# Patient Record
Sex: Female | Born: 1990 | Race: White | Hispanic: No | Marital: Married | Smoking: Former smoker
Health system: Southern US, Community
[De-identification: ages and names within clinical notes are randomized; demographics above are authoritative.]

## PROBLEM LIST (undated history)

## (undated) ENCOUNTER — Inpatient Hospital Stay (HOSPITAL_COMMUNITY): Payer: Self-pay

## (undated) DIAGNOSIS — I82402 Acute embolism and thrombosis of unspecified deep veins of left lower extremity: Secondary | ICD-10-CM

## (undated) DIAGNOSIS — D6862 Lupus anticoagulant syndrome: Secondary | ICD-10-CM

## (undated) DIAGNOSIS — F1910 Other psychoactive substance abuse, uncomplicated: Secondary | ICD-10-CM

## (undated) DIAGNOSIS — F319 Bipolar disorder, unspecified: Secondary | ICD-10-CM

## (undated) DIAGNOSIS — F4310 Post-traumatic stress disorder, unspecified: Secondary | ICD-10-CM

## (undated) DIAGNOSIS — G8220 Paraplegia, unspecified: Secondary | ICD-10-CM

## (undated) DIAGNOSIS — F329 Major depressive disorder, single episode, unspecified: Secondary | ICD-10-CM

## (undated) DIAGNOSIS — M549 Dorsalgia, unspecified: Secondary | ICD-10-CM

## (undated) DIAGNOSIS — F32A Depression, unspecified: Secondary | ICD-10-CM

## (undated) DIAGNOSIS — G8929 Other chronic pain: Secondary | ICD-10-CM

## (undated) DIAGNOSIS — O24419 Gestational diabetes mellitus in pregnancy, unspecified control: Secondary | ICD-10-CM

## (undated) DIAGNOSIS — F419 Anxiety disorder, unspecified: Secondary | ICD-10-CM

## (undated) DIAGNOSIS — M419 Scoliosis, unspecified: Secondary | ICD-10-CM

## (undated) HISTORY — DX: Post-traumatic stress disorder, unspecified: F43.10

## (undated) HISTORY — DX: Dorsalgia, unspecified: M54.9

## (undated) HISTORY — DX: Scoliosis, unspecified: M41.9

## (undated) HISTORY — DX: Anxiety disorder, unspecified: F41.9

## (undated) HISTORY — DX: Paraplegia, unspecified: G82.20

## (undated) HISTORY — DX: Lupus anticoagulant syndrome: D68.62

## (undated) HISTORY — DX: Acute embolism and thrombosis of unspecified deep veins of left lower extremity: I82.402

## (undated) HISTORY — DX: Depression, unspecified: F32.A

## (undated) HISTORY — DX: Major depressive disorder, single episode, unspecified: F32.9

---
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
## 2008-01-01 ENCOUNTER — Emergency Department (HOSPITAL_COMMUNITY): Admission: EM | Admit: 2008-01-01 | Discharge: 2008-01-01 | Payer: Self-pay | Admitting: Emergency Medicine

## 2010-01-26 ENCOUNTER — Encounter: Payer: Self-pay | Admitting: Internal Medicine

## 2010-06-19 ENCOUNTER — Encounter: Payer: Self-pay | Admitting: Family Medicine

## 2010-06-30 NOTE — Letter (Signed)
[facility]   Imported By: Lanelle Bal 02/08/2010 09:19:48  _____________________________________________________________________  External Attachment:    Type:   Image     Comment:   External Document
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
## 2010-10-14 NOTE — Group Therapy Note (Signed)
NAMEKASSIDI, Theresa Shaw NO.:  [id number]   MEDICAL RECORD NO.:  [id number]          PATIENT TYPE:  INP   LOCATION:  A428                          FACILITY:  [facility]   PHYSICIAN:  Scott A. Gerda Diss, MD    DATE OF BIRTH:  1991/01/24   DATE OF PROCEDURE:  DATE OF DISCHARGE:                                   PROGRESS NOTE   Patient overall doing better.  INR is up slightly compared to where it was  but still not where it needs to be.  Our goal is to get the INR up in the  2.5-3 range but at least in the 2 range before patient can go home.  The INR  is currently 1.1.  She is breathing well, no chest pains or discomfort.  Her  scan yesterday did not show a PE.   I did speak with Dr. Mariel Sleet yesterday, and he will be coming by for  initial visit, and overall I think the patient still needs to stay here  because I really do not feel that there is adequate social support at home  and in regards to doing these shots.  Keeping her at bed rest for right now.      Scott A. Gerda Diss, MD  Electronically Signed     SAL/MEDQ  D:  03/22/2005  T:  03/22/2005  Job:  [id number]
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
## 2010-10-14 NOTE — Discharge Summary (Signed)
[name], Theresa Shaw NO.:  [id number]   MEDICAL RECORD NO.:  [id number]          PATIENT TYPE:  INP   LOCATION:  A428                          FACILITY:  [facility]   PHYSICIAN:  Scott A. Gerda Diss, MD    DATE OF BIRTH:  May 30, 1990   DATE OF ADMISSION:  03/20/2005  DATE OF DISCHARGE:  10/29/2006LH                                 DISCHARGE SUMMARY   DISCHARGE DIAGNOSIS:  Deep vein thrombus left leg, also urinary tract  infection, also paraplegia due to a spinal cord injury.   HOSPITAL COURSE:  The patient was admitted in to the hospital and treated  with Lovenox initially and monitored closely and gradually improved and on  10/29 INR was to a point where she could be discharged to home.  INR was  2.7.  At that point, she was sent home on Coumadin and Reglan before meals  and at bedtime, Pepcid b.i.d., Zanaflex p.r.n., methadone once daily,  Celebrex daily, stresstabs every 6-8 hours as needed.  It should be noted  that the patient was often in the room by herself when rounds were done,  making it difficulty to communicate with parents the findings.  Good care  was taken care of the patient.  She did have some physical therapy while she  was in the hospital.  It is our opinion that the parents do a good job  taking care of the young lady, but it is best at her given age that a family  member be with her when we do rounds to adequately discuss the medical  progress.  To get PT/INR drawn as an outpatient this coming week and to  follow up within two weeks.      Scott A. Gerda Diss, MD  Electronically Signed     SAL/MEDQ  D:  04/03/2005  T:  04/03/2005  Job:  [id number]

## 2011-02-24 LAB — CBC
HCT: 37
Hemoglobin: 12.8
MCHC: 34.7
MCV: 88.7
Platelets: 212
RBC: 4.18
RDW: 11.9
WBC: 10.8 — ABNORMAL HIGH

## 2011-02-24 LAB — DIFFERENTIAL
Basophils Absolute: 0.1
Basophils Relative: 1
Eosinophils Absolute: 0.2
Eosinophils Relative: 2
Lymphocytes Relative: 20
Lymphs Abs: 2.1
Monocytes Absolute: 0.7
Monocytes Relative: 7
Neutro Abs: 7.7
Neutrophils Relative %: 71

## 2011-02-24 LAB — HCG, QUANTITATIVE, PREGNANCY: hCG, Beta Chain, Quant, S: 83248 — ABNORMAL HIGH

## 2011-02-24 LAB — ABO/RH: ABO/RH(D): A NEG

## 2011-03-16 LAB — URINALYSIS, ROUTINE W REFLEX MICROSCOPIC
Bilirubin Urine: NEGATIVE
Glucose, UA: NEGATIVE
Ketones, ur: NEGATIVE
Nitrite: POSITIVE — AB
Specific Gravity, Urine: 1.02
Urobilinogen, UA: 0.2
pH: 8

## 2011-03-16 LAB — URINE CULTURE: Colony Count: >=100000

## 2011-03-16 LAB — URINE MICROSCOPIC-ADD ON

## 2011-03-16 LAB — RAPID STREP SCREEN (MED CTR MEBANE ONLY): Streptococcus, Group A Screen (Direct): POSITIVE — AB

## 2011-10-31 ENCOUNTER — Ambulatory Visit (HOSPITAL_COMMUNITY): Payer: Self-pay

## 2011-11-27 ENCOUNTER — Encounter (HOSPITAL_COMMUNITY): Payer: Self-pay | Admitting: Oncology

## 2011-11-27 ENCOUNTER — Encounter (HOSPITAL_COMMUNITY): Payer: Medicaid Other | Attending: Oncology | Admitting: Oncology

## 2011-11-27 VITALS — BP 103/63 | HR 90 | Temp 98.0°F

## 2011-11-27 DIAGNOSIS — M4120 Other idiopathic scoliosis, site unspecified: Secondary | ICD-10-CM

## 2011-11-27 DIAGNOSIS — M419 Scoliosis, unspecified: Secondary | ICD-10-CM

## 2011-11-27 DIAGNOSIS — Z86718 Personal history of other venous thrombosis and embolism: Secondary | ICD-10-CM

## 2011-11-27 DIAGNOSIS — G8220 Paraplegia, unspecified: Secondary | ICD-10-CM

## 2011-11-27 HISTORY — DX: Scoliosis, unspecified: M41.9

## 2011-11-27 NOTE — Patient Instructions (Signed)
[facility]  Discharge Instructions  RECOMMENDATIONS MADE BY THE CONSULTANT AND ANY TEST RESULTS WILL BE SENT TO YOUR REFERRING DOCTOR.    We will do a hypercoagulable panel today. We will have you come back in 7-10 days to review test results.    I acknowledge that I have been informed and understand all the instructions given to me and received a copy. I do not have any more questions at this time, but understand that I may call the Specialty Clinic at [facility] at [phone] during business hours should I have any further questions or need assistance in obtaining follow-up care.    __________________________________________  _____________  __________ Signature of Patient or Authorized Representative            Date                   Time    __________________________________________ Nurse's Signature
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
## 2011-11-28 ENCOUNTER — Other Ambulatory Visit (HOSPITAL_COMMUNITY): Payer: Self-pay

## 2011-11-29 ENCOUNTER — Encounter (HOSPITAL_COMMUNITY): Payer: Self-pay | Admitting: Oncology

## 2011-12-06 ENCOUNTER — Telehealth (HOSPITAL_COMMUNITY): Payer: Self-pay | Admitting: *Deleted

## 2011-12-07 ENCOUNTER — Ambulatory Visit (HOSPITAL_COMMUNITY): Payer: Self-pay | Admitting: Oncology

## 2011-12-08 ENCOUNTER — Encounter (HOSPITAL_COMMUNITY): Payer: Self-pay | Admitting: Oncology

## 2011-12-08 ENCOUNTER — Ambulatory Visit (HOSPITAL_COMMUNITY): Payer: Self-pay | Admitting: Oncology

## 2011-12-22 ENCOUNTER — Emergency Department (HOSPITAL_COMMUNITY)
Admission: EM | Admit: 2011-12-22 | Discharge: 2011-12-22 | Discharge status: Against Medical Advice | Payer: Medicaid Other | Attending: Emergency Medicine | Admitting: Emergency Medicine

## 2011-12-22 ENCOUNTER — Encounter (HOSPITAL_COMMUNITY): Payer: Self-pay

## 2011-12-22 DIAGNOSIS — M4120 Other idiopathic scoliosis, site unspecified: Secondary | ICD-10-CM | POA: Insufficient documentation

## 2011-12-22 DIAGNOSIS — F4310 Post-traumatic stress disorder, unspecified: Secondary | ICD-10-CM | POA: Insufficient documentation

## 2011-12-22 DIAGNOSIS — M545 Low back pain, unspecified: Secondary | ICD-10-CM | POA: Insufficient documentation

## 2011-12-22 DIAGNOSIS — M549 Dorsalgia, unspecified: Secondary | ICD-10-CM

## 2011-12-22 DIAGNOSIS — Z86718 Personal history of other venous thrombosis and embolism: Secondary | ICD-10-CM | POA: Insufficient documentation

## 2011-12-22 DIAGNOSIS — G8220 Paraplegia, unspecified: Secondary | ICD-10-CM | POA: Insufficient documentation

## 2011-12-22 DIAGNOSIS — Z8669 Personal history of other diseases of the nervous system and sense organs: Secondary | ICD-10-CM

## 2011-12-22 DIAGNOSIS — F17200 Nicotine dependence, unspecified, uncomplicated: Secondary | ICD-10-CM | POA: Insufficient documentation

## 2011-12-22 NOTE — ED Notes (Signed)
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  Pt has take opiates for the past 6 yrs for back pain.  Pt reports that she is unable to stop on her own.  Pt denies any HI/SI.  Pt requesting outpatient care.
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
## 2012-02-13 ENCOUNTER — Emergency Department (HOSPITAL_COMMUNITY)
Admission: EM | Admit: 2012-02-13 | Discharge: 2012-02-14 | Disposition: A | Discharge status: Other Acute Inpatient Hospital | Payer: 59 | Source: Home / Self Care | Attending: Emergency Medicine | Admitting: Emergency Medicine

## 2012-02-13 ENCOUNTER — Encounter (HOSPITAL_COMMUNITY): Payer: Self-pay

## 2012-02-13 DIAGNOSIS — R45851 Suicidal ideations: Secondary | ICD-10-CM | POA: Insufficient documentation

## 2012-02-13 DIAGNOSIS — N390 Urinary tract infection, site not specified: Secondary | ICD-10-CM | POA: Insufficient documentation

## 2012-02-13 DIAGNOSIS — F1910 Other psychoactive substance abuse, uncomplicated: Secondary | ICD-10-CM

## 2012-02-13 DIAGNOSIS — IMO0002 Reserved for concepts with insufficient information to code with codable children: Secondary | ICD-10-CM | POA: Insufficient documentation

## 2012-02-13 LAB — RAPID URINE DRUG SCREEN, HOSP PERFORMED
Amphetamines: NOT DETECTED
Barbiturates: NOT DETECTED
Cocaine: POSITIVE — AB
Opiates: NOT DETECTED
Tetrahydrocannabinol: POSITIVE — AB

## 2012-02-13 LAB — URINALYSIS, ROUTINE W REFLEX MICROSCOPIC
Bilirubin Urine: NEGATIVE
Ketones, ur: NEGATIVE mg/dL
Urobilinogen, UA: 0.2 mg/dL (ref 0.0–1.0)

## 2012-02-13 LAB — CBC WITH DIFFERENTIAL/PLATELET
Basophils Absolute: 0 10*3/uL (ref 0.0–0.1)
HCT: 41.8 % (ref 36.0–46.0)
Lymphocytes Relative: 14 % (ref 12–46)
Lymphs Abs: 1.9 10*3/uL (ref 0.7–4.0)
Monocytes Absolute: 0.9 10*3/uL (ref 0.1–1.0)
Neutro Abs: 10.4 10*3/uL — ABNORMAL HIGH (ref 1.7–7.7)
Platelets: 281 10*3/uL (ref 150–400)
RBC: 4.77 MIL/uL (ref 3.87–5.11)
RDW: 11.9 % (ref 11.5–15.5)
WBC: 13.3 10*3/uL — ABNORMAL HIGH (ref 4.0–10.5)

## 2012-02-13 LAB — BASIC METABOLIC PANEL
CO2: 26 mEq/L (ref 19–32)
Chloride: 105 mEq/L (ref 96–112)
Glucose, Bld: 98 mg/dL (ref 70–99)
Sodium: 142 mEq/L (ref 135–145)

## 2012-02-13 LAB — URINE MICROSCOPIC-ADD ON

## 2012-02-13 MED ORDER — ZOLPIDEM TARTRATE 5 MG PO TABS: 5.0000 mg | ORAL_TABLET | Freq: Every evening | ORAL | Status: DC | PRN

## 2012-02-13 MED ORDER — IBUPROFEN 400 MG PO TABS: 600.0000 mg | ORAL_TABLET | Freq: Three times a day (TID) | ORAL | Status: DC | PRN

## 2012-02-13 MED ORDER — GLYCERIN (LAXATIVE) 1.2 G RE SUPP
2.0000 | Freq: Once | RECTAL | Status: AC
  Administered 2012-02-13: 2.4 g via RECTAL

## 2012-02-13 MED ORDER — CEPHALEXIN 500 MG PO CAPS
500.0000 mg | ORAL_CAPSULE | Freq: Four times a day (QID) | ORAL | Status: DC
  Administered 2012-02-13: 500 mg via ORAL
  Filled 2012-02-13: qty 1

## 2012-02-13 MED ORDER — LORAZEPAM 1 MG PO TABS
1.0000 mg | ORAL_TABLET | Freq: Three times a day (TID) | ORAL | Status: DC | PRN
  Administered 2012-02-13: 1 mg via ORAL
  Filled 2012-02-13: qty 1

## 2012-02-13 MED ORDER — ACETAMINOPHEN 325 MG PO TABS
650.0000 mg | ORAL_TABLET | ORAL | Status: DC | PRN
  Administered 2012-02-13 (×2): 650 mg via ORAL
  Filled 2012-02-13 (×2): qty 2

## 2012-02-13 MED ORDER — ONDANSETRON HCL 4 MG PO TABS: 4.0000 mg | ORAL_TABLET | Freq: Three times a day (TID) | ORAL | Status: DC | PRN

## 2012-02-13 MED ORDER — GLYCERIN (LAXATIVE) 1.2 G RE SUPP
RECTAL | Status: AC
  Administered 2012-02-13: 15:00:00
  Filled 2012-02-13: qty 2

## 2012-02-13 MED ORDER — IBUPROFEN 800 MG PO TABS: 800.0000 mg | ORAL_TABLET | Freq: Once | ORAL | Status: DC

## 2012-02-13 NOTE — ED Provider Notes (Signed)
  Physical Exam  BP 124/72  Pulse 88  Temp 98.3 F (36.8 C) (Oral)  Resp 18  Wt 140 lb (63.504 kg)  SpO2 100%  Physical Exam  ED Course  Procedures  MDM Patient has been accepted at [facility] R. Rubin Payor, MD 02/13/12 2019

## 2012-02-13 NOTE — ED Notes (Signed)
Theresa Shaw from act to see pt. He has also spoken to pt's mother. Sitter at bedside.
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
## 2012-02-13 NOTE — ED Notes (Signed)
md notified of pt's complaints of pain, stated that pt. Was not getting any narcotics.  Pt and mother made aware.
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
## 2012-02-13 NOTE — ED Notes (Signed)
Pt requested to self cath herself. Also has to disimpact self --supplies given, pt requested privacy and mother at bedside, mother returned to bedside, pt began yelling and cursing at mother, stated "i got something for you when I get out", "you'll pay for this".  C/o headache. Offered ibuprofen or tylenol, declined both, then stated to rn "you'll bring me my fucking hydrocodone now bitch", advised not to speak to staff in that manner, "you get ut bitch, I want another nurse".  Mother remains at bedside.   Sitter at bedside

## 2012-02-13 NOTE — ED Notes (Signed)
Jacque perkins from sane to see pt.

## 2012-02-13 NOTE — ED Notes (Signed)
pts purse, wheelchair and slide board all labeled and locked.  wanded by security.
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
## 2012-02-13 NOTE — ED Notes (Signed)
rn spoke to sane nurse, explained that pt does not want a kit completed but is in need of resources. She stated she will come to speak to pt.  Pt is in agreement to speak with sane nurse.
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
## 2012-02-14 ENCOUNTER — Encounter (HOSPITAL_COMMUNITY): Payer: Self-pay | Admitting: *Deleted

## 2012-02-14 ENCOUNTER — Inpatient Hospital Stay (HOSPITAL_COMMUNITY)
Admission: AD | Admit: 2012-02-14 | Discharge: 2012-02-19 | DRG: 897 | Disposition: A | Discharge status: Home / Self Care | Payer: 59 | Source: Ambulatory Visit | Attending: Psychiatry | Admitting: Psychiatry

## 2012-02-14 DIAGNOSIS — M549 Dorsalgia, unspecified: Secondary | ICD-10-CM | POA: Diagnosis present

## 2012-02-14 DIAGNOSIS — F1910 Other psychoactive substance abuse, uncomplicated: Secondary | ICD-10-CM

## 2012-02-14 DIAGNOSIS — F411 Generalized anxiety disorder: Secondary | ICD-10-CM

## 2012-02-14 DIAGNOSIS — M4120 Other idiopathic scoliosis, site unspecified: Secondary | ICD-10-CM | POA: Diagnosis present

## 2012-02-14 DIAGNOSIS — F1310 Sedative, hypnotic or anxiolytic abuse, uncomplicated: Secondary | ICD-10-CM | POA: Diagnosis present

## 2012-02-14 DIAGNOSIS — G8929 Other chronic pain: Secondary | ICD-10-CM | POA: Diagnosis present

## 2012-02-14 DIAGNOSIS — F1410 Cocaine abuse, uncomplicated: Secondary | ICD-10-CM | POA: Diagnosis present

## 2012-02-14 DIAGNOSIS — F4310 Post-traumatic stress disorder, unspecified: Secondary | ICD-10-CM | POA: Diagnosis present

## 2012-02-14 DIAGNOSIS — F329 Major depressive disorder, single episode, unspecified: Secondary | ICD-10-CM

## 2012-02-14 DIAGNOSIS — F1120 Opioid dependence, uncomplicated: Secondary | ICD-10-CM | POA: Diagnosis present

## 2012-02-14 DIAGNOSIS — R894 Abnormal immunological findings in specimens from other organs, systems and tissues: Secondary | ICD-10-CM | POA: Diagnosis present

## 2012-02-14 DIAGNOSIS — Z79899 Other long term (current) drug therapy: Secondary | ICD-10-CM

## 2012-02-14 DIAGNOSIS — F1994 Other psychoactive substance use, unspecified with psychoactive substance-induced mood disorder: Secondary | ICD-10-CM | POA: Diagnosis present

## 2012-02-14 DIAGNOSIS — IMO0002 Reserved for concepts with insufficient information to code with codable children: Secondary | ICD-10-CM

## 2012-02-14 DIAGNOSIS — G8220 Paraplegia, unspecified: Secondary | ICD-10-CM

## 2012-02-14 MED ORDER — TRAZODONE HCL 50 MG PO TABS
50.0000 mg | ORAL_TABLET | Freq: Every evening | ORAL | Status: DC | PRN
  Filled 2012-02-14 (×4): qty 1

## 2012-02-14 MED ORDER — METHOCARBAMOL 500 MG PO TABS
500.0000 mg | ORAL_TABLET | Freq: Three times a day (TID) | ORAL | Status: AC | PRN
  Administered 2012-02-17 – 2012-02-19 (×3): 500 mg via ORAL
  Filled 2012-02-14 (×3): qty 1

## 2012-02-14 MED ORDER — AMITRIPTYLINE HCL 25 MG PO TABS
25.0000 mg | ORAL_TABLET | Freq: Every evening | ORAL | Status: DC | PRN
  Administered 2012-02-14 – 2012-02-18 (×8): 25 mg via ORAL
  Filled 2012-02-14: qty 1
  Filled 2012-02-14: qty 28
  Filled 2012-02-14 (×8): qty 1
  Filled 2012-02-14: qty 28
  Filled 2012-02-14 (×6): qty 1

## 2012-02-14 MED ORDER — CLONIDINE HCL 0.1 MG PO TABS
0.1000 mg | ORAL_TABLET | ORAL | Status: AC
  Administered 2012-02-17 – 2012-02-18 (×2): 0.1 mg via ORAL
  Filled 2012-02-14 (×4): qty 1

## 2012-02-14 MED ORDER — DICYCLOMINE HCL 20 MG PO TABS: 20.0000 mg | ORAL_TABLET | Freq: Four times a day (QID) | ORAL | Status: AC | PRN

## 2012-02-14 MED ORDER — GABAPENTIN 100 MG PO CAPS
100.0000 mg | ORAL_CAPSULE | Freq: Three times a day (TID) | ORAL | Status: AC
  Administered 2012-02-14 – 2012-02-15 (×2): 100 mg via ORAL
  Filled 2012-02-14 (×2): qty 1

## 2012-02-14 MED ORDER — HYDROXYZINE HCL 25 MG PO TABS
25.0000 mg | ORAL_TABLET | Freq: Four times a day (QID) | ORAL | Status: AC | PRN
  Administered 2012-02-14 (×2): 25 mg via ORAL
  Filled 2012-02-14: qty 1

## 2012-02-14 MED ORDER — MELOXICAM 7.5 MG PO TABS
7.5000 mg | ORAL_TABLET | Freq: Every day | ORAL | Status: DC
  Administered 2012-02-15 – 2012-02-19 (×5): 7.5 mg via ORAL
  Filled 2012-02-14 (×6): qty 1
  Filled 2012-02-14: qty 14
  Filled 2012-02-14: qty 1

## 2012-02-14 MED ORDER — MAGNESIUM HYDROXIDE 400 MG/5ML PO SUSP: 30.0000 mL | Freq: Every day | ORAL | Status: DC | PRN

## 2012-02-14 MED ORDER — CLONIDINE HCL 0.1 MG PO TABS
0.1000 mg | ORAL_TABLET | Freq: Four times a day (QID) | ORAL | Status: AC
  Administered 2012-02-14 – 2012-02-16 (×5): 0.1 mg via ORAL
  Filled 2012-02-14 (×11): qty 1

## 2012-02-14 MED ORDER — LIDOCAINE 5 % EX PTCH
1.0000 | MEDICATED_PATCH | CUTANEOUS | Status: DC
  Administered 2012-02-15 – 2012-02-19 (×5): 1 via TRANSDERMAL
  Filled 2012-02-14 (×8): qty 1

## 2012-02-14 MED ORDER — ENSURE COMPLETE PO LIQD
237.0000 mL | Freq: Two times a day (BID) | ORAL | Status: DC
  Administered 2012-02-16: 237 mL via ORAL

## 2012-02-14 MED ORDER — ALUM & MAG HYDROXIDE-SIMETH 200-200-20 MG/5ML PO SUSP: 30.0000 mL | ORAL | Status: DC | PRN

## 2012-02-14 MED ORDER — CLONIDINE HCL 0.1 MG PO TABS
0.1000 mg | ORAL_TABLET | Freq: Every day | ORAL | Status: DC
  Filled 2012-02-14 (×2): qty 1

## 2012-02-14 MED ORDER — NICOTINE 21 MG/24HR TD PT24
21.0000 mg | MEDICATED_PATCH | Freq: Every day | TRANSDERMAL | Status: DC
  Administered 2012-02-14 – 2012-02-15 (×2): 21 mg via TRANSDERMAL
  Filled 2012-02-14 (×4): qty 1

## 2012-02-14 MED ORDER — DULOXETINE HCL 30 MG PO CPEP
30.0000 mg | ORAL_CAPSULE | Freq: Every day | ORAL | Status: DC
  Administered 2012-02-14 – 2012-02-19 (×6): 30 mg via ORAL
  Filled 2012-02-14 (×8): qty 1
  Filled 2012-02-14: qty 14
  Filled 2012-02-14: qty 1

## 2012-02-14 MED ORDER — CEPHALEXIN 500 MG PO CAPS
500.0000 mg | ORAL_CAPSULE | Freq: Four times a day (QID) | ORAL | Status: DC
  Administered 2012-02-14 – 2012-02-19 (×22): 500 mg via ORAL
  Filled 2012-02-14 (×11): qty 1
  Filled 2012-02-14: qty 8
  Filled 2012-02-14 (×7): qty 1
  Filled 2012-02-14: qty 8
  Filled 2012-02-14 (×9): qty 1
  Filled 2012-02-14: qty 8
  Filled 2012-02-14 (×2): qty 1

## 2012-02-14 MED ORDER — ACETAMINOPHEN 325 MG PO TABS
650.0000 mg | ORAL_TABLET | Freq: Four times a day (QID) | ORAL | Status: DC | PRN
  Administered 2012-02-14: 650 mg via ORAL

## 2012-02-14 MED ORDER — DICLOFENAC SODIUM 1 % TD GEL
2.0000 g | Freq: Four times a day (QID) | TRANSDERMAL | Status: AC
  Administered 2012-02-14: 2 g via TOPICAL
  Filled 2012-02-14: qty 100

## 2012-02-14 MED ORDER — GABAPENTIN 300 MG PO CAPS
300.0000 mg | ORAL_CAPSULE | Freq: Three times a day (TID) | ORAL | Status: DC
  Administered 2012-02-16 – 2012-02-18 (×9): 300 mg via ORAL
  Filled 2012-02-14 (×17): qty 1

## 2012-02-14 MED ORDER — GABAPENTIN 100 MG PO CAPS
200.0000 mg | ORAL_CAPSULE | Freq: Three times a day (TID) | ORAL | Status: AC
  Administered 2012-02-15 – 2012-02-16 (×4): 200 mg via ORAL
  Filled 2012-02-14 (×3): qty 2
  Filled 2012-02-14: qty 1
  Filled 2012-02-14: qty 2

## 2012-02-14 MED ORDER — NAPROXEN 500 MG PO TABS
500.0000 mg | ORAL_TABLET | Freq: Two times a day (BID) | ORAL | Status: AC | PRN
  Administered 2012-02-14 – 2012-02-18 (×3): 500 mg via ORAL
  Filled 2012-02-14 (×3): qty 1

## 2012-02-14 MED ORDER — GLYCERIN (LAXATIVE) 1.2 G RE SUPP
1.0000 | Freq: Two times a day (BID) | RECTAL | Status: DC | PRN
  Administered 2012-02-15 – 2012-02-16 (×2): 1.2 g via RECTAL
  Filled 2012-02-14: qty 1

## 2012-02-14 MED ORDER — LOPERAMIDE HCL 2 MG PO CAPS
2.0000 mg | ORAL_CAPSULE | ORAL | Status: AC | PRN
  Administered 2012-02-15: 2 mg via ORAL

## 2012-02-14 MED ORDER — ONDANSETRON 4 MG PO TBDP: 4.0000 mg | ORAL_TABLET | Freq: Four times a day (QID) | ORAL | Status: AC | PRN

## 2012-02-14 NOTE — H&P (Signed)
Medical/psychiatric screening examination/treatment/procedure(s) were performed by non-physician practitioner and as supervising physician I was immediately available for consultation/collaboration.  I have seen and examined this patient and agree with the major elements of this evaluation.  
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
## 2012-02-14 NOTE — ED Notes (Signed)
[facility] Deputy here awaiting EMS to transport patient.

## 2012-02-14 NOTE — H&P (Signed)
Read and reviewed. Discussed with extender.  Dr. Dan Humphreys will complete SRA.
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
## 2012-02-14 NOTE — Progress Notes (Signed)
Psychoeducational Group Note  Date:  02/14/2012 Time:  1100  Group Topic/Focus:  Personal Choices and Values:   The focus of this group is to help patients assess and explore the importance of values in their lives, how their values affect their decisions, how they express their values and what opposes their expression.  Participation Level: Did Not Attend  Participation Quality:  Not Applicable  Affect:  Not Applicable  Cognitive:  Not Applicable  Insight:  Not Applicable  Engagement in Group: Not Applicable  Additional Comments:  Pt did not attend group but remained resting in bed.   Sharyn Lull 02/14/2012, 11:49 AM

## 2012-02-14 NOTE — Progress Notes (Signed)
[facility] Group Notes:  (Counselor/Nursing/MHT/Case Management/Adjunct)  02/14/2012 2:30 PM  Type of Therapy: Group Therapy   Participation Level: Did not attend.  Asleep     Marni Griffon 02/14/2012  2:30 PM

## 2012-02-14 NOTE — Progress Notes (Signed)
Patient ID: Theresa Shaw, female   DOB: 12-02-1990, 21 y.o.   MRN: [id number] This is a 21 year old paraplegic admitted to the unit due to suicide thought and HI towards her mother. According to report; she is also a substance abuser. Positive for cocaine and THC. Patient arrived on the unit via care link and was taken straight to her room. She appeared tired and drowsy on admission. Admission assessment not completed because patient unwilling to go out of her room. She seemed tired; wants to answer some of the questions in her room, did not want to get out of her bed. She already had a room mate and that would be violating HEPA to be doing assessment while there is another patient in the room.She came with care supply (Wipes, gloves,suppositories and her wheel chair). Writer received order from the Physician on call for patient to use her supply on the unit.  Patient stated that she uses her suppository once a day. Will notify day nurse to get order for suppositories.  Her belongings including  Pocket book, cell phone, blanket and make up are locked up in [place] [id number].

## 2012-02-14 NOTE — Tx Team (Signed)
Initial Interdisciplinary Treatment Plan  PATIENT STRENGTHS: (choose at least two) Ability for insight Communication skills General fund of knowledge  PATIENT STRESSORS: Health problems Substance abuse   PROBLEM LIST: Problem List/Patient Goals Date to be addressed Date deferred Reason deferred Estimated date of resolution  Substance abuse 02/14/12                                                      DISCHARGE CRITERIA:  Ability to meet basic life and health needs Adequate post-discharge living arrangements Improved stabilization in mood, thinking, and/or behavior Medical problems require only outpatient monitoring Motivation to continue treatment in a less acute level of care Need for constant or close observation no longer present Reduction of life-threatening or endangering symptoms to within safe limits Safe-care adequate arrangements made Withdrawal symptoms are absent or subacute and managed without 24-hour nursing intervention  PRELIMINARY DISCHARGE PLAN: Outpatient therapy Return to previous living arrangement  PATIENT/FAMIILY INVOLVEMENT: This treatment plan has been presented to and reviewed with the patient, Theresa Shaw, and/or family member, The patient and family have been given the opportunity to ask questions and make suggestions.  Beatrix Shipper 02/14/2012, 10:01 AM
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
## 2012-02-14 NOTE — Progress Notes (Signed)
Pt reports withdrawals from opiates and headache of a 10. Reported to NP for orders. Per NP will consider protocol.
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
## 2012-02-14 NOTE — Progress Notes (Signed)
Patient ID: Theresa Shaw, female   DOB: January 28, 1991, 21 y.o.   MRN: [id number] D: Pt. In hall, reports some relief from pain "a little better", then c/o neck/back pain "7" of 10.  "my bed won't raise." A: Staff in to change bed. Writer noted that this will help relieve neck/back pain. Writer reviewed meds no pain med due at this time. Writer told pt. meds will be given at next schedule time. Staff will monitor q48min for safety. Staff encouraged pt. To attend group. R: Pt. Nods in agreement about med administration. Pt. Remains safe on the unit. Pt. Attends group.
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
## 2012-02-15 DIAGNOSIS — F1120 Opioid dependence, uncomplicated: Secondary | ICD-10-CM

## 2012-02-15 DIAGNOSIS — F1410 Cocaine abuse, uncomplicated: Secondary | ICD-10-CM

## 2012-02-15 DIAGNOSIS — F1310 Sedative, hypnotic or anxiolytic abuse, uncomplicated: Secondary | ICD-10-CM

## 2012-02-15 LAB — URINE CULTURE

## 2012-02-15 MED ORDER — DOCUSATE SODIUM 100 MG PO CAPS
100.0000 mg | ORAL_CAPSULE | Freq: Every day | ORAL | Status: DC
  Administered 2012-02-16 – 2012-02-19 (×3): 100 mg via ORAL
  Filled 2012-02-15 (×6): qty 1
  Filled 2012-02-15: qty 14
  Filled 2012-02-15 (×2): qty 1

## 2012-02-15 MED ORDER — NICOTINE POLACRILEX 2 MG MT GUM
2.0000 mg | CHEWING_GUM | OROMUCOSAL | Status: DC | PRN
  Administered 2012-02-17 – 2012-02-18 (×3): 2 mg via ORAL

## 2012-02-15 NOTE — Progress Notes (Signed)
02/15/2012         Time: 1500      Group Topic/Focus: The focus of this group is on enhancing the patient's understanding of leisure, barriers to leisure, and the importance of engaging in positive leisure activities upon discharge for improved total health.  Participation Level: Active  Participation Quality: Attentive  Affect: Blunted  Cognitive: Alert   Additional Comments: Patient flat, appeared to be zoned out at times, had trouble with short term memory.  Damar Petit 02/15/2012 3:33 PM

## 2012-02-15 NOTE — Treatment Plan (Signed)
Interdisciplinary Treatment Plan Update (Adult)  Date: 02/15/2012  Time Reviewed:1:22 PM  Progress in Treatment:  Attending groups:No  Participating in groups: No  Taking medication as prescribed: Yes  Tolerating medication: Yes  Family/Significant other contact made: Yes, Mother Patient understands diagnosis: Yes, as evidenced by asking for detox from opiates and help with anxiety. Discussing patient identified problems/goals with staff: Yes See below  Medical problems stabilized or resolved: Yes  Denies suicidal/homicidal ideation: Yes, when asked Issues/concerns per patient self-inventory: not completed  Other:  New problem(s) identified: N/A  Reason for Continuation of Hospitalization:  Depression/anxiety Medication stabilization  Withdrawal symptoms   Interventions implemented related to continuation of hospitalization: Clonidine taper, med stabilization, encourage group attendance and participation.  Additional comments:  Estimated length of stay:3-4 days  Discharge Plan: Pt interested in [facility]  New goal(s): N/A  Review of initial/current patient goals per problem list:  1. Goal(s): Safely detox from opiates             Met: No             Target date: by d/c             As evidenced by: stable vitals, no withdrawal symptoms  2. Goal(s):Eliminate SI/HI  Met: Yes  Target date: 9/19 As evidenced ZO:[id number] report individually  3. Goal (s): Stabilize mood  Met: No  Target date:by d/c As evidenced by: Pt will rate her depression/anxiety at a 4 or less 4. Goal(s): Identify comprehensive sobriety plan  Met: Yes  Target date: 9/19 As evidenced by: Pt seeking admission into [facility]. Attendees:    Patient:  02/15/2012 1:22 PM    Family:     Physician:  02/15/2012 1:22 PM    Nursing:  9/19/20131:22 PM   Case Manager: Richelle Ito, LCSW  02/15/2012 1:22 PM   Counselor:  9/19/20131:22 PM  .   Other: Trula Slade, MSW Intern 02/15/2012 1:22PM   Other:     Other:     Other:       Scribe for Treatment Team: Trula Slade, MSW Intern, 9/19/20131:22 PM
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
## 2012-02-15 NOTE — Progress Notes (Signed)
D:  Patient stayed in bed this morning stating that she didn't feel well.  Did get up around 10 am and catheterized herself using a straight cath.  She also requested a glycerine suppository so that she could do her bowel program.  States she had a really hard time because she does not do well when there are other people in the room.  She also states that she has not been eating much as a means of not having a bowel movement.  She has attended all groups since late morning and is tolerating her medications.  Denies suicidal ideation at this time.  A:  Glycerine suppository given to patient for self use.  Encouraged participation in groups.  Offered support and encouragement.  R:  Pleasant and cooperative on the unit.  Interacting well with staff and peers.  Was a bit irritable early this morning, but has been much more pleasant as the day has progressed.

## 2012-02-15 NOTE — Discharge Planning (Signed)
Theresa Shaw declined to come to AM group.  Said she felt sick to her stomach and was unable to attend groups.  Able to engage at midmorning with myself, PA.  States her anxiety is really high and requests benzos and ambien for sleep. Rates anxiety at 10.  States she will likely go stay with her mother because she is afraid to go back to her apartment.  Signed release to contact mother. Will follow up today.
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
## 2012-02-15 NOTE — Progress Notes (Signed)
[facility] Group Notes:  (Counselor/Nursing/MHT/Case Management/Adjunct)  02/15/2012  2:30 PM  Type of Therapy: Group Therapy   Participation Level: Minimal   Participation Quality: Limited  Affect: Blunted  Cognitive: oriented, alert   Insight: Poor  Engagement in Group: Limited  Modes of Intervention: Clarification, Education, Problem-solving, Socialization,  Encouragement and Support, Activity   Summary of Progress/Problems: Patient remained in group for the entire time.  Pt participated in group by listening and self disclosing.  After a brief check-in, therapist introduced the topic of the importance of maintaining a  balanced lifestyle.   Therapist prompted patients to identify how their lives are out of balance, and which areas need the most attention.  Therapist asked patients to identify activities that they would like to participate in but were not able to do so because of their drug use or money spent on alcohol. Therapist educated patients on the difference in guilt and shame which can cause emotional imbalance. Pt openly disclosed how fear for her life had influenced her actions.  Therapist encouraged patients to engage in therapy following DC to resolve the issues over which they continue to drink or use drugs. Therapist encouraged Pts to set boundaries needed to insure sobriety.   Pt agreed to focus on making positive changes.  Patient actively participated in the Positive Affirmation Activity by giving and receiving positive affirmations to and from others.  Pt smiled and agreed this exercise was mood elevating. Therapist offered support and encouragement.  Minimal progress noted.  Intervention effective.         Marni Griffon C 02/15/2012  2:30 PM

## 2012-02-15 NOTE — Progress Notes (Signed)
Patient ID: Theresa Shaw, female   DOB: February 08, 1991, 21 y.o.   MRN: [id number] Pt has a small reddened area on the outer aspect of her right ankle. The skin is intact and the area is blanching so it should not be a stage one ulcer. Writer encouraged pt to avoid pressure on the area and to use a pillow between her ankles. Writer also provided a pair of socks in order to protect her feet. Pt is independent with her ADL's.

## 2012-02-15 NOTE — Discharge Planning (Signed)
Pt agreed to got to [facility] if she can have her own room for personal hygiene purposes. CM Intern spoke with Arlys John (nurse at [facility]) to ask about these accommodations. They will need pt medical info sent in order to make official decision regarding whether they can accommodate pt's request. Pt signed [facility] release. CM intern spoke with pt's mother to invite to tx team for Fri. Pt's mother will be out of town tomorrow but can be reached via cell phone if necessary [phone]. Pt's mother also requested that CM discuss alternative living arrangements for pt after treatment is completed.

## 2012-02-16 LAB — HEPATITIS PANEL, ACUTE
HCV Ab: NEGATIVE
Hep A IgM: NEGATIVE
Hep B C IgM: NEGATIVE
Hepatitis B Surface Ag: NEGATIVE

## 2012-02-16 LAB — HIV ANTIBODY (ROUTINE TESTING W REFLEX): HIV: NONREACTIVE

## 2012-02-16 NOTE — Progress Notes (Signed)
D patient slept well last nite and states her appetite is improving, going to Dr for meals, energy level is normal and ability to pay attention is improving, depressed 4/10 and hopeless 5/10 today denies SI or HI, WD s/s tremors and cravings and c/o lightheadedness, dizziness, headaches, blurred vision and pain, not attending group, states stomatch is too upset A q25min safety checks continue and support offered, pain med given with resulting lowered pain, encouraged to go to group and participate instead of staying in room/bed all the time R patient remains safe on the unit

## 2012-02-16 NOTE — Progress Notes (Signed)
[id number]    Theresa Shaw [birth year] 2 02/16/2012 PROGRESS NOTE Diagnosis: Axis I: Opiate dependence, PTSD, Chronic pain, paraplegia, cocaine abuse, benzodiazepine abuse  lupus anticoagulant positive   ADL's: Impaired patient is doing her own catherizations  Sleep: "slept well the medications really helped." Appetite:much better Groups:None  Subjective: Met with the patient today, states she didn't hear the announcement for group and that's why she didn't attend.  Also states she needs her suppository as she has not moved her bowels today. Theresa Shaw states that she will go to [facility].  Her mother has stated that she can not come home until she has treatment.  height is 5\' 5"  (1.651 m). Her oral temperature is 97 F (36.1 C). Her blood pressure is 98/65 and her pulse is 76. Her respiration is 16 and oxygen saturation is 99%.   Objective: patient was up in wheel chair but in her room. She states she feels much better this morning and she has no pain. Theresa Shaw would like to thank Dr. Dan Humphreys for helping her with her pain and her anxiety.    Sx of withdrawal: denies  Ros: ROS:  Constitional: WDWN Adult in NAD  COR: negative for SOB, CP, cough, wheezing  GI: Negative for Nausea, vomiting, diarrhea, constipation, abdominal pain  Neuro: negative for dizziness, blurred vision, headaches, numbness or tingling  Ortho: negative for limb pain, swelling, change in ambulatory status.  Mental Status Exam  Level of Consciousness: awake  Orientation: x 3  General Appearance: fairly groomed Behavior: cooperative  Eye Contact: fair Motor Behavior: normal  Speech: Clear and goal directed  Mood: mild depression Suicidal Ideation: No suicidal ideation, no plan, no intent, no means.  Homicidal Ideation: No homicidal ideation, no plan, no intent, no means.  Affect: congruent  Anxiety Level: 5 moderate Thought Process: linear  Thought Content: normal  Perception: fair  Judgment: improving Insight: lacking    Cognition: At least  Sleep: Number of Hours: 6.75  Lab Results:  Labs are reviewed with the patient.  HIV is negative and she is advised to have it rechecked in 6 months.  GC/CZ are pending as is Hepatitis panel. Physical Findings:  AIMS:  CIWA: 0 COWS: 0 Medication:  .  amitriptyline  25 mg  Oral  QHS,MR X 1   .  cephALEXin  500 mg  Oral  Q6H   .  cloNIDine  0.1 mg  Oral  QID    Followed by   .  cloNIDine  0.1 mg  Oral  BH-qamhs    Followed by   .  cloNIDine  0.1 mg  Oral  QAC breakfast   .  diclofenac sodium  2 g  Topical  QID    Followed by   .  lidocaine  1 patch  Transdermal  Q24H   .  DULoxetine  30 mg  Oral  Daily   .  feeding supplement  237 mL  Oral  BID BM   .  gabapentin  100 mg  Oral  TID WC & HS    Followed by   .  gabapentin  200 mg  Oral  TID WC & HS    Followed by   .  gabapentin  300 mg  Oral  TID WC & HS   .  meloxicam  7.5 mg  Oral  Daily   .  DISCONTD: nicotine  21 mg  Transdermal  Q0600   .  DISCONTD: traZODone  50 mg  Oral  QHS,MR  X 1   Treatment Plan Summary:  1. Admit for crisis management and stabilization.  2. Medication management to reduce current symptoms to base line and improve the patient's overall level of functioning  3. Treat health problems as indicated.  4. Develop treatment plan to decrease risk of relapse upon discharge and the need for readmission.  5. Psycho-social education regarding relapse prevention and self care.  6. Health care follow up as needed for medical problems.  7. Restart home medications where appropriate.  Plan:  1. CM informs me that Mother is not coming to meeting as she is in [place].  She has informed Theresa Shaw that she must go to treatment before she can come home. 2.CM will contact [facility] to see if Theresa Shaw could participate there with her disability. 3.Reviewed labs with patient and educated her about follow up testing in 6 months. She is aware that some tests are still in process. 4. Will continue the current plan  of care with no changes at this time.  Rona Ravens. Zeplin Aleshire PAC 02/16/2012 11:30 AM

## 2012-02-16 NOTE — Discharge Planning (Signed)
Theresa Shaw is in a good mood today.  Happy that her pain is less than she anticipated.  Still interested in rehab.  I talked to Lawson Fiscal, nurse at [facility], who states they are not set up for complete hygiene accommodations there.  Celise agreed to referral to [facility] as back-up plan.  Referral sent.

## 2012-02-16 NOTE — Progress Notes (Signed)
Psychoeducational Group Note  Date:  02/16/2012 Time:  1100  Group Topic/Focus:  Relapse Prevention Planning:   The focus of this group is to define relapse and discuss the need for planning to combat relapse.  Participation Level:  None  Participation Quality:  Appropriate  Affect:  Flat  Cognitive:  Alert   Engagement in Group:  None  Additional Comments:   Pt attended  group discussing relapse prevention planning but did not participate.   Dalia Heading 02/16/2012, 12:40 PM

## 2012-02-16 NOTE — Progress Notes (Signed)
Patient ID: Theresa Shaw, female   DOB: [birth year], 21 y.o.   MRN: [id number] D: pt.  Reports "feeling better, I want to get sober, I didn't want to when I first got here, but not having pills made me feel like I don't want them anymore."  A: Writer provided emotional support, encouraged pt. To continue path to recovery. Staff will monitor q52min for safety. Staff encouraged to go to group. R: Pt. Is safe on the unit. Pt. Refuse karaoke to proceed with bowel program.

## 2012-02-16 NOTE — Progress Notes (Signed)
Pt has been up and has been active while in the milieu this evening, pt has been participating in various milieu activities, pt has expressed various signs and symptoms of withdrawal today, pt did state that her chronic back pain was feeling much better today and felt the medications were helping, pt is preparing for discharge in the next few days, support and encouragement provided, will continue to monitor

## 2012-02-16 NOTE — Progress Notes (Signed)
Psychoeducational Group Note  Date:  02/16/2012 Time:  1000  Group Topic/Focus:  Relapse Prevention Planning:   The focus of this group is to define relapse and discuss the need for planning to combat relapse. Question Newman Pies Therapeutic Activity  Participation Level: Did Not Attend  Participation Quality:  Not Applicable  Affect:  Not Applicable  Cognitive:  Not Applicable  Insight:  Not Applicable  Engagement in Group: Not Applicable  Additional Comments:  Pt did not attend group. Pt remained in bed.  Karleen Hampshire Brittini 02/16/2012, 10:49 AM'

## 2012-02-16 NOTE — Discharge Planning (Signed)
Spoke with pt individually about aftercare plan. Pt signed release for [facility]. Information faxed. Pt called mother with CM intern. Pt's mother agreed to allow pt to stay at her home until admission into [facility].

## 2012-02-16 NOTE — Progress Notes (Signed)
[facility] Group Notes:  (Counselor/Nursing/MHT/Case Management/Adjunct)  02/16/2012  2:30  PM  Type of Therapy: Group Therapy   Participation Level: Minimal   Participation Quality: Limited  Affect: Depressed  Cognitive: oriented, alert   Insight: fair  Engagement in Group: Limited  Modes of Intervention: Clarification, Education, Problem-solving, Socialization, Activity, Encouragement and Support   Summary of Progress/Problems: Pt participated in group by listening and self disclosing.  After a brief check-in, therapist introduced the topic of Feelings Around Relapse. Therapist asked Patients to identify:  Which emotions come before and after relapse; 2.  Which emotions are the most powerful.  Pt identified loneliness.  Therapist emphasized the importance of and benefit to recovery of learning to ask for help vs trying to maintain a false sense of control.  Therapist prompted patients to practice asking another Pt a question.  3. Which emotions they have related to recovery. Therapist prompted patients to identify their recovery plans and any barriers they perceive to their efforts.  Therapist offered support and encouragement.  Some progress noted.  Intervention effective.         Marni Griffon C 02/16/2012  2:30 PM

## 2012-02-16 NOTE — Progress Notes (Signed)
Patient did not  attend the evening karaoke group. Pt wanted to stay back and take care of her toiletting needs while other patients were off the hall.

## 2012-02-17 NOTE — Progress Notes (Signed)
Patient ID: Theresa Shaw, female   DOB: [birth year], 21 y.o.   MRN: [id number]  Pt. attended and participated in aftercare planning group. Pt. verbally accepted information on suicide prevention, warning signs to look for with suicide and crisis line numbers to use. Pt. listed their current anxiety level as 6 and their current depression level as 2.

## 2012-02-17 NOTE — Progress Notes (Signed)
  Theresa Shaw is a 21 y.o. female [id number] 09-19-1990  02/14/2012 Active Problems:  Benzodiazepine abuse, continuous  Opiate dependence  PTSD (post-traumatic stress disorder)   Mental Status: Mood is so much better as her pain is under control. Denies SI/HI/AVH.   Subjective/Objective: Hoping [facility] will not object to her wheelchair like [facility] did.Can't come to [facility] as she does not have transportation.     Filed Vitals:   02/17/12 0700  BP: 99/62  Pulse: 59  Temp: 98 F (36.7 C)  Resp: 20    Lab Results:   BMET    Component Value Date/Time   NA 142 02/13/2012 1342   K 3.5 02/13/2012 1342   CL 105 02/13/2012 1342   CO2 26 02/13/2012 1342   GLUCOSE 98 02/13/2012 1342   BUN 7 02/13/2012 1342   CREATININE 0.60 02/13/2012 1342   CALCIUM 9.7 02/13/2012 1342   GFRNONAA >90 02/13/2012 1342   GFRAA >90 02/13/2012 1342    Medications:  Scheduled:     . amitriptyline  25 mg Oral QHS,MR X 1  . cephALEXin  500 mg Oral Q6H  . cloNIDine  0.1 mg Oral QID   Followed by  . cloNIDine  0.1 mg Oral BH-qamhs   Followed by  . cloNIDine  0.1 mg Oral QAC breakfast  . docusate sodium  100 mg Oral Daily  . DULoxetine  30 mg Oral Daily  . feeding supplement  237 mL Oral BID BM  . gabapentin  300 mg Oral TID WC & HS  . lidocaine  1 patch Transdermal Q24H  . meloxicam  7.5 mg Oral Daily     PRN Meds acetaminophen, alum & mag hydroxide-simeth, dicyclomine, glycerin (Pediatric), hydrOXYzine, loperamide, magnesium hydroxide, methocarbamol, naproxen, nicotine polacrilex, ondansetron  Plan: Continue with current plan of care.            Kevin Space,Theresa D. 02/17/2012

## 2012-02-17 NOTE — Progress Notes (Signed)
D-Patient is out in milieu interacting with peers and attending groups with no participation. A- Rates depression and hopelessness at 2.  Denies SI.  Reports tremors r/t withdrawal but no prn medications requested. Minimal insight into SA issues. R- Support and encouragement given. Continue current POC and evaluation of treatment goals. Continue 15' checks for safety.

## 2012-02-17 NOTE — Progress Notes (Signed)
Patient did attend the evening speaker AA meeting.  

## 2012-02-17 NOTE — Progress Notes (Signed)
Psychoeducational Group Note  Date:  02/17/2012 Time:  1015  Group Topic/Focus:  Coping With Mental Health Crisis:   The purpose of this group is to help patients identify strategies for coping with mental health crisis.  Group discusses possible causes of crisis and ways to manage them effectively.  Participation Level:  Minimal  Participation Quality:  Appropriate and Attentive  Affect:  Appropriate  Cognitive:  Alert and Appropriate  Insight:  Limited  Engagement in Group:  Limited  Additional Comments:   Cresenciano Lick 02/17/2012, 3:53 PM

## 2012-02-17 NOTE — Progress Notes (Signed)
Psychoeducational Group Note  Date:  02/17/2012 Time:  0315  Group Topic/Focus:  Healthy Communication:   The focus of this group is to discuss communication, barriers to communication, as well as healthy ways to communicate with others.  Participation Level:  None  Participation Quality:  Appropriate and Resistant  Affect:  Appropriate and Flat  Cognitive:  Alert and Appropriate  Engagement in Group:  None  Additional Comments:  Pt attended and participated in group discussing health communication. Group discussion involved identifying unhealthy and healthy ways in which we communicate. Pt identified with examples of both unhealthy and healthy ways of communicating.   Dalia Heading 02/17/2012, 6:59 PM

## 2012-02-17 NOTE — Progress Notes (Signed)
Patient ID: Theresa Shaw, female   DOB: 03/14/1991, 21 y.o.   MRN: [id number] Pt resting in bed talking with room mate.  No distress noted.  Needs assessed.  Pt denies.  Support given.  Will continue to monitor.

## 2012-02-17 NOTE — Progress Notes (Signed)
Patient ID: Theresa Shaw, female   DOB: 1991-05-05, 21 y.o.   MRN: [id number]   [facility] Group Notes:  (Counselor/Nursing/MHT/Case Management/Adjunct)  02/17/2012 1:15 PM  Type of Therapy:  Group Therapy, Dance/Movement Therapy   Participation Level:  Active  Participation Quality:  Appropriate  Affect:  Appropriate  Cognitive:  Appropriate  Insight:  Limited  Engagement in Group:  Good  Engagement in Therapy:  Good  Modes of Intervention:  Clarification, Problem-solving, Role-play, Socialization and Support  Summary of Progress/Problems: Therapist and group members discussed self-sabotaging behaviors. Group members shared negative thoughts and justifications from their addiction, such as "just have one more drink, you can handle it." Group members then wrote down one of these justifications and ripped it up. Pt shared that her personal justifications come from blaming others such as the doctors who prescribed her methadone and hydrocodone. In terms of her recovery, pt stated, "I can do it."     Cassidi Long 02/17/2012. 2:34 PM

## 2012-02-18 MED ORDER — GABAPENTIN 400 MG PO CAPS
400.0000 mg | ORAL_CAPSULE | Freq: Three times a day (TID) | ORAL | Status: DC
Start: 2012-02-18 — End: 2012-02-19
  Administered 2012-02-18 – 2012-02-19 (×5): 400 mg via ORAL
  Filled 2012-02-18 (×2): qty 1
  Filled 2012-02-18: qty 56
  Filled 2012-02-18 (×6): qty 1
  Filled 2012-02-18 (×2): qty 56
  Filled 2012-02-18 (×4): qty 1
  Filled 2012-02-18: qty 56
  Filled 2012-02-18: qty 1

## 2012-02-18 NOTE — Progress Notes (Signed)
Patient ID: Theresa Shaw, female   DOB: 12-Aug-1990, 21 y.o.   MRN: [id number] D) Has been out and about on the unit, participating in the milieu, gets around well using her w/c.  Attended group, but spent a fair amount of time on the phone both before and after group.  Asked if everything was ok, replied no, that she was breaking up with her boyfriend.  Stated she was ok with it, though, wanted to get off drugs and go back with an old BF who had been very good to her, but didn't like her using.  Stated that guys on the hall had been helping her and had asked for her phone number but she was giving them the wrong number, and laughed.   A) Encouraged her not to share personal information, reminded her that she was here for the program, and to focus on getting better, and she agreed. Seems to have ltd insight on relationships, drug use.  Has been compliant with meds, feels they are helping and is feeling better, though still has chronic back pain issues, not resolved but more tolerable..   Attending groups and participating. R) Receptive to the program, denies thoughts of self harm, will continue to monitor for safety.

## 2012-02-18 NOTE — Progress Notes (Signed)
[facility] Group Notes:  (Counselor/Nursing/MHT/Case Management/Adjunct)  02/18/2012 8:39 PM  Type of Therapy:  AA  Participation Level:  Active  Participation Quality:  Appropriate  Affect:  Appropriate  Cognitive:  Appropriate  Insight:  Good  Engagement in Group:  Good  Engagement in Therapy:  Good  Modes of Intervention:  Support  Summary of Progress/Problems: Theresa Shaw attended AA tonight.   Theresa, Shaw 02/18/2012, 8:39 PM

## 2012-02-18 NOTE — Progress Notes (Signed)
D- Patient is out in the milieu interacting with peers. Did not attend RN group. No physical complaints verbalized to this Clinical research associate. Rates depression and hopelessness at 1. A- Positive support and encouragment given. Continue current POC and evaluation of treatment goals.  12 step concepts reenforced.  R- Denies SI. No overt s/s of narcotic withdrawal. Compliant with scheduled medications.

## 2012-02-18 NOTE — Progress Notes (Signed)
Psychoeducational Group Note  Date:  02/18/2012 Time:  1015  Group Topic/Focus:  Crisis Planning:   The purpose of this group is to help patients create a crisis plan for use upon discharge or in the future, as needed.  Participation Level:  Did Not Attend  Participation Quality:  did not attend  Affect:  Depressed  Cognitive:  Appropriate  Insight:  did not attend  Engagement in Group:  None  Additional Comments:   Cresenciano Lick 02/18/2012, 12:37 PM

## 2012-02-18 NOTE — Progress Notes (Signed)
Psychoeducational Group Note  Date:  02/18/2012 Time:  0300  Group Topic/Focus:  Goals Group:   The focus of this group is to help patients establish daily goals to achieve during treatment and discuss how the patient can incorporate goal setting into their daily lives to aide in recovery.  Participation Level:  Minimal  Participation Quality:  Appropriate, Attentive and Resistant  Affect:  Appropriate and Flat  Cognitive:  Alert and Appropriate  Insight:  Good  Engagement in Group:  Limited  Additional Comments:  Pt attended and participated in group discussing goal setting. Pts participation was minimal but she did complete the activity that was given during group to make at least 1 goal that they would like to complete in the next year using the guidelines covered in group.   Dalia Heading 02/18/2012, 4:00 PM

## 2012-02-18 NOTE — Progress Notes (Signed)
Patient ID: Theresa Shaw, female   DOB: [birth year], 21 y.o.   MRN: [id number] D)  Has been out and about on hall, interacting well with staff and peers, participating in the milieu.  Came to med window this evening at beginning of shift to request something for pain and muscle spasms.  Attended AA group, feels she is making progress. Has area to Rt foot laterally where she has developed a pressure ulcer, appears as a blister, brownish colored, no drainage noted.  A) Bandaid with neosporin applied to rt foot and clean socks.  Also was given robaxin for muscle spasms and naproxen for pain and muscle soreness.  Will continue to monitor for safety. R) Receptive and appreciative.  Compliant with meds and participating in program, looking for long term program that is w/c accessible and accepting of her limitations.

## 2012-02-18 NOTE — Progress Notes (Signed)
Patient ID: Theresa Shaw, female   DOB: December 13, 1990, 21 y.o.   MRN: [id number]  Pt. attended and participated in aftercare planning group. Pt. verbally accepted information on suicide prevention, warning signs to look for with suicide and crisis line numbers to use. The pt. agreed to call crisis line numbers if having warning signs or having thoughts of suicide. Pt. received the daily workbook and [employer].

## 2012-02-18 NOTE — Progress Notes (Signed)
  Theresa Shaw is a 21 y.o. female [id number] 24-Mar-1991  02/14/2012 Active Problems:  Benzodiazepine abuse, continuous  Opiate dependence  PTSD (post-traumatic stress disorder)   Mental Status:  Mood is better denies SI/HI/AVH  Subjective/Objective: Asks for increase in Neurontin for back pain. Hopeful that placement will be found in long term SA program tomorrow.   Filed Vitals:   02/18/12 0645  BP: 98/63  Pulse: 73  Temp: 97.3 F (36.3 C)  Resp: 20    Lab Results:   BMET    Component Value Date/Time   NA 142 02/13/2012 1342   K 3.5 02/13/2012 1342   CL 105 02/13/2012 1342   CO2 26 02/13/2012 1342   GLUCOSE 98 02/13/2012 1342   BUN 7 02/13/2012 1342   CREATININE 0.60 02/13/2012 1342   CALCIUM 9.7 02/13/2012 1342   GFRNONAA >90 02/13/2012 1342   GFRAA >90 02/13/2012 1342    Medications:  Scheduled:     . amitriptyline  25 mg Oral QHS,MR X 1  . cephALEXin  500 mg Oral Q6H  . cloNIDine  0.1 mg Oral BH-qamhs   Followed by  . cloNIDine  0.1 mg Oral QAC breakfast  . docusate sodium  100 mg Oral Daily  . DULoxetine  30 mg Oral Daily  . feeding supplement  237 mL Oral BID BM  . gabapentin  300 mg Oral TID WC & HS  . lidocaine  1 patch Transdermal Q24H  . meloxicam  7.5 mg Oral Daily     PRN Meds acetaminophen, alum & mag hydroxide-simeth, dicyclomine, glycerin (Pediatric), hydrOXYzine, loperamide, magnesium hydroxide, methocarbamol, naproxen, nicotine polacrilex, ondansetron  Plan: Increase Neurontin - continue current plan.  Lubna Stegeman,MICKIE D. 02/18/2012

## 2012-02-18 NOTE — Progress Notes (Signed)
Patient ID: Theresa Shaw, female   DOB: Oct 26, 1990, 21 y.o.   MRN: [id number]   [facility] Group Notes:  (Counselor/Nursing/MHT/Case Management/Adjunct)  02/18/2012 1:15 PM  Type of Therapy:  Group Therapy, Dance/Movement Therapy   Participation Level:  Minimal  Participation Quality:  Appropriate  Affect:  Appropriate  Cognitive:  Appropriate  Insight:  Good  Engagement in Group:  Limited  Engagement in Therapy:  Limited  Modes of Intervention:  Clarification, Problem-solving, Role-play, Socialization and Support  Summary of Progress/Problems: Therapist and group members read and discussed the Twelve Promises and the I am your Recovery poem. Group members shared their ideas, fears, and expectations for recovery.  Pt did not share much during group but appeared engaged and attentive.    Cassidi Long 02/18/2012. 2:23 PM

## 2012-02-19 DIAGNOSIS — F1994 Other psychoactive substance use, unspecified with psychoactive substance-induced mood disorder: Secondary | ICD-10-CM | POA: Diagnosis present

## 2012-02-19 MED ORDER — GABAPENTIN 400 MG PO CAPS: 400.0000 mg | ORAL_CAPSULE | Freq: Three times a day (TID) | ORAL | Status: DC

## 2012-02-19 MED ORDER — LEVONORGESTREL 20 MCG/24HR IU IUD: 1.0000 | INTRAUTERINE_SYSTEM | Freq: Once | INTRAUTERINE | Status: DC

## 2012-02-19 MED ORDER — DSS 100 MG PO CAPS: 100.0000 mg | ORAL_CAPSULE | Freq: Every day | ORAL | Status: DC

## 2012-02-19 MED ORDER — AMITRIPTYLINE HCL 25 MG PO TABS: 25.0000 mg | ORAL_TABLET | Freq: Every evening | ORAL | Status: DC | PRN

## 2012-02-19 MED ORDER — CEPHALEXIN 500 MG PO CAPS: 500.0000 mg | ORAL_CAPSULE | Freq: Four times a day (QID) | ORAL | Status: DC

## 2012-02-19 MED ORDER — MELOXICAM 7.5 MG PO TABS: 7.5000 mg | ORAL_TABLET | Freq: Every day | ORAL | Status: DC

## 2012-02-19 MED ORDER — DULOXETINE HCL 30 MG PO CPEP: 30.0000 mg | ORAL_CAPSULE | Freq: Every day | ORAL | Status: DC

## 2012-02-19 NOTE — Progress Notes (Signed)
Patient did not attend group.
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
## 2012-02-19 NOTE — Progress Notes (Addendum)
D:  Self inventory sheet, patient sleeps well, has good appetite, normal energy level, good attention span.  Rated depression and hopelessness #1.  Denied withdrawals.   Denied SI.   Denied physical problems.  Pain goal #1.  Worst pain #3.  "Move, get a job, stay clean, go to school."  No questions for staff.  Does have discharge plans.  No problems taking meds after discharge. A:   Meds given per MD order.  Support and encouragement given throughout day.   Support and safety checks completed as ordered. R:  Following treatment plan.   Denied SI and HI.   Denied A/V hallucinations.  Denied pain.  Patient remains safe and receptive on unit.  Patient stated she is very appreciative to Dr. Dan Humphreys, that this is the first time in years that she has not had any pain.  Continues to ambulate in wheelchair in hallway.  Has attended one group this morning. Patient stated that after her discharge, she plans to look for a job and return to school.   Stated her son is with her on the weekends and his dad takes care of him during the week.  Patient's mother plans to pick her up later this afternoon and return to her mother's home.  Denied access to any firearms.

## 2012-02-19 NOTE — Progress Notes (Signed)
[facility] Adult Inpatient Family/Significant Other Suicide Prevention Education  Suicide Prevention Education:  Education Completed;Caroll Rancher, patient's mother at [phone] has been identified by the patient as the family member/significant other with whom the patient will be residing, and identified as the person(s) who will aid the patient in the event of a mental health crisis (suicidal ideations/suicide attempt).  With written consent from the patient, the family member/significant other has been provided the following suicide prevention education, prior to the and/or following the discharge of the patient.  The suicide prevention education provided includes the following:  Suicide risk factors  Suicide prevention and interventions  National Suicide Hotline telephone number  [facility] assessment telephone number  [facility] Emergency Assistance 911  [facility] and/or Residential Mobile Crisis Unit telephone number  Request made of family/significant other to:  Remove weapons (e.g., guns, rifles, knives), all items previously/currently identified as safety concern.    Remove drugs/medications (over-the-counter, prescriptions, illicit drugs), all items previously/currently identified as a safety concern.  Ms Loralyn Freshwater assured Clinical research associate this information had been addressed previously by another staff member (although not documented) Ms Loralyn Freshwater was given mobile crisis number as that had not been provided earlier.  The family member/significant other verbalizes understanding of the suicide prevention education information provided.  The family member/significant other agrees to remove the items of safety concern listed above.  Clide Dales 02/19/2012, 11:49 AM

## 2012-02-19 NOTE — Progress Notes (Signed)
[facility] Case Management Discharge Plan:  Will you be returning to the same living situation after discharge: Yes,  returning home At discharge, do you have transportation home?:Yes,  access to transportation Do you have the ability to pay for your medications:Yes,  access to meds  Release of information consent forms completed and in the chart;  Patient's signature needed at discharge.  Patient to Follow up at:  Follow-up Information    Follow up with [facility] On 02/21/2012. (Appointment scheduled at 7:45 am, referral # [id number])    Contact information:   [address] [place], [place] [address] [phone]         Patient denies SI/HI:   Yes,  denies SI/HI    Safety Planning and Suicide Prevention discussed:  Yes,  discussed with pt today  Barrier to discharge identified:No.  Summary and Recommendations: Pt attended discharge planning group and actively participated in group.  SW provided pt with today's workbook.  Pt presents with calm mood and affect.  Pt denies having depression, anxiety and SI/HI.  Pt reports feeling stable to d/c today.  No recommendations from SW.  No further needs voiced by pt.  Pt stable to discharge.     Carmina Miller 02/19/2012, 12:43 PM
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
## 2012-02-19 NOTE — Progress Notes (Signed)
Patient ID: Theresa Shaw, female   DOB: 04-24-1991, 21 y.o.   MRN: [id number] Patient has order for d/c. She verbalized understanding of follow up and medications. Denies SI/HI. Received samples of her medications. Mood appeared stable at time of d/c. Patient was picked up by her mother.

## 2012-02-20 NOTE — Discharge Summary (Signed)
I agree with this D/C Summary.  

## 2012-02-21 NOTE — Progress Notes (Signed)
Patient Discharge Instructions:  After Visit Summary (AVS):   Faxed to:  02/21/2012 Psychiatric Admission Assessment Note:   Faxed to:  02/21/2012 Suicide Risk Assessment - Discharge Assessment:   Faxed to:  02/21/2012 Faxed/Sent to the Next Level Care provider:  02/21/2012  Faxed to [facility] @ [phone]  Heloise Purpura, Eduard Clos, 02/21/2012, 2:59 PM

## 2012-06-07 ENCOUNTER — Encounter (HOSPITAL_COMMUNITY): Payer: Self-pay | Admitting: Oncology

## 2012-10-29 ENCOUNTER — Encounter (HOSPITAL_COMMUNITY): Payer: Self-pay | Admitting: *Deleted

## 2012-10-29 ENCOUNTER — Emergency Department (HOSPITAL_COMMUNITY)
Admission: EM | Admit: 2012-10-29 | Discharge: 2012-10-29 | Disposition: A | Discharge status: Home / Self Care | Payer: Medicaid Other | Attending: Emergency Medicine | Admitting: Emergency Medicine

## 2012-10-29 DIAGNOSIS — Z8739 Personal history of other diseases of the musculoskeletal system and connective tissue: Secondary | ICD-10-CM | POA: Insufficient documentation

## 2012-10-29 DIAGNOSIS — F4310 Post-traumatic stress disorder, unspecified: Secondary | ICD-10-CM | POA: Insufficient documentation

## 2012-10-29 DIAGNOSIS — Z8669 Personal history of other diseases of the nervous system and sense organs: Secondary | ICD-10-CM | POA: Insufficient documentation

## 2012-10-29 DIAGNOSIS — Z79899 Other long term (current) drug therapy: Secondary | ICD-10-CM | POA: Insufficient documentation

## 2012-10-29 DIAGNOSIS — B86 Scabies: Secondary | ICD-10-CM | POA: Insufficient documentation

## 2012-10-29 DIAGNOSIS — F17200 Nicotine dependence, unspecified, uncomplicated: Secondary | ICD-10-CM | POA: Insufficient documentation

## 2012-10-29 DIAGNOSIS — Z86718 Personal history of other venous thrombosis and embolism: Secondary | ICD-10-CM | POA: Insufficient documentation

## 2012-10-29 DIAGNOSIS — N390 Urinary tract infection, site not specified: Secondary | ICD-10-CM | POA: Insufficient documentation

## 2012-10-29 LAB — URINALYSIS, ROUTINE W REFLEX MICROSCOPIC
Bilirubin Urine: NEGATIVE
Glucose, UA: NEGATIVE mg/dL
Specific Gravity, Urine: 1.02 (ref 1.005–1.030)
pH: 5.5 (ref 5.0–8.0)

## 2012-10-29 LAB — URINE MICROSCOPIC-ADD ON

## 2012-10-29 MED ORDER — PERMETHRIN 5 % EX CREA: TOPICAL_CREAM | CUTANEOUS | Status: DC

## 2012-10-29 MED ORDER — SULFAMETHOXAZOLE-TRIMETHOPRIM 800-160 MG PO TABS: 1.0000 | ORAL_TABLET | Freq: Two times a day (BID) | ORAL | Status: AC

## 2012-10-29 MED ORDER — SULFAMETHOXAZOLE-TMP DS 800-160 MG PO TABS
1.0000 | ORAL_TABLET | Freq: Once | ORAL | Status: AC
  Administered 2012-10-29: 1 via ORAL
  Filled 2012-10-29: qty 1

## 2012-10-29 NOTE — ED Notes (Signed)
Pt reports that she has strong smelling, cloudy urine and believes she may have a UTI.  Also reporting bug bites on buttocks that may be infected.
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
## 2012-12-08 ENCOUNTER — Other Ambulatory Visit: Payer: Self-pay

## 2012-12-08 ENCOUNTER — Emergency Department (HOSPITAL_COMMUNITY)
Admission: EM | Admit: 2012-12-08 | Discharge: 2012-12-08 | Discharge status: Against Medical Advice | Payer: Medicaid Other | Attending: Emergency Medicine | Admitting: Emergency Medicine

## 2012-12-08 ENCOUNTER — Encounter (HOSPITAL_COMMUNITY): Payer: Self-pay | Admitting: Emergency Medicine

## 2012-12-08 DIAGNOSIS — R42 Dizziness and giddiness: Secondary | ICD-10-CM | POA: Insufficient documentation

## 2012-12-08 DIAGNOSIS — R202 Paresthesia of skin: Secondary | ICD-10-CM

## 2012-12-08 DIAGNOSIS — Z79899 Other long term (current) drug therapy: Secondary | ICD-10-CM | POA: Insufficient documentation

## 2012-12-08 DIAGNOSIS — Z86718 Personal history of other venous thrombosis and embolism: Secondary | ICD-10-CM | POA: Insufficient documentation

## 2012-12-08 DIAGNOSIS — Z8739 Personal history of other diseases of the musculoskeletal system and connective tissue: Secondary | ICD-10-CM | POA: Insufficient documentation

## 2012-12-08 DIAGNOSIS — Z8669 Personal history of other diseases of the nervous system and sense organs: Secondary | ICD-10-CM | POA: Insufficient documentation

## 2012-12-08 DIAGNOSIS — F17200 Nicotine dependence, unspecified, uncomplicated: Secondary | ICD-10-CM | POA: Insufficient documentation

## 2012-12-08 DIAGNOSIS — R079 Chest pain, unspecified: Secondary | ICD-10-CM | POA: Insufficient documentation

## 2012-12-08 DIAGNOSIS — F4310 Post-traumatic stress disorder, unspecified: Secondary | ICD-10-CM | POA: Insufficient documentation

## 2012-12-08 DIAGNOSIS — R209 Unspecified disturbances of skin sensation: Secondary | ICD-10-CM | POA: Insufficient documentation

## 2012-12-08 MED ORDER — ACETAMINOPHEN 500 MG PO TABS: 1000.0000 mg | ORAL_TABLET | Freq: Once | ORAL | Status: DC
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
## 2012-12-08 NOTE — ED Notes (Signed)
Dr. Colon Branch came out of patient's room after talking with her, staetd to give patient an AMA form. Went into room, patient had removed monitor leads and threw pulse oximetry and blood pressure cuff across the room. Patient crying and verbalizing that she is upset. Patient states, "I have had issues with blood clots in my legs before and I feel like I need to have ultrasounds done to confirm that I do not have blood clots." Explained to patient that Dr. Colon Branch wanted to have C-spine xray done due to patient having numbness and tingling in hands. Patient agreed to stay to have xray done.

## 2012-12-08 NOTE — ED Notes (Signed)
Patient complaining of intermittent episodes of numbness and tingling to arms. Reports starts with numbness and tingling throughout left arm and into left hand. States the feeling moves to right arm and then she develops chest pain. Patient states symptoms have bothered her x approximately 2 days, but has worsened. Also complains of nausea and swelling to left lower extremity. Patient is paralyzed from waist down and reports hasn't taken her Coumadin or Lovenox for quite a while.

## 2012-12-08 NOTE — ED Notes (Signed)
Heard patient yelling on phone. Went into patient's room to see if she was ready to go to xray. Patient now stating that she is not going to stay to have the test done and wants to leave AMA.

## 2013-01-14 ENCOUNTER — Ambulatory Visit (INDEPENDENT_AMBULATORY_CARE_PROVIDER_SITE_OTHER): Payer: Medicaid Other | Admitting: Obstetrics & Gynecology

## 2013-01-14 ENCOUNTER — Encounter: Payer: Self-pay | Admitting: Obstetrics & Gynecology

## 2013-01-14 VITALS — BP 100/70 | Ht 63.0 in | Wt 150.0 lb

## 2013-01-14 DIAGNOSIS — Z975 Presence of (intrauterine) contraceptive device: Secondary | ICD-10-CM

## 2013-01-14 DIAGNOSIS — Z30432 Encounter for removal of intrauterine contraceptive device: Secondary | ICD-10-CM

## 2013-01-14 NOTE — Progress Notes (Signed)
Patient ID: Theresa Shaw, female   DOB: [birth year], 22 y.o.   MRN: [id number] Mirena removed without difficulty Patient does not want any other form right now, considering replacing the Mirena

## 2013-04-30 ENCOUNTER — Ambulatory Visit (INDEPENDENT_AMBULATORY_CARE_PROVIDER_SITE_OTHER): Payer: Medicaid Other | Admitting: Nurse Practitioner

## 2013-04-30 ENCOUNTER — Telehealth: Payer: Self-pay | Admitting: Family Medicine

## 2013-04-30 ENCOUNTER — Encounter: Payer: Self-pay | Admitting: Nurse Practitioner

## 2013-04-30 VITALS — BP 112/66 | Temp 98.2°F | Ht 64.0 in | Wt 134.0 lb

## 2013-04-30 DIAGNOSIS — F341 Dysthymic disorder: Secondary | ICD-10-CM

## 2013-04-30 DIAGNOSIS — G8929 Other chronic pain: Secondary | ICD-10-CM

## 2013-04-30 DIAGNOSIS — L0390 Cellulitis, unspecified: Secondary | ICD-10-CM

## 2013-04-30 DIAGNOSIS — F418 Other specified anxiety disorders: Secondary | ICD-10-CM

## 2013-04-30 DIAGNOSIS — M549 Dorsalgia, unspecified: Secondary | ICD-10-CM

## 2013-04-30 DIAGNOSIS — L0291 Cutaneous abscess, unspecified: Secondary | ICD-10-CM

## 2013-04-30 MED ORDER — SULFAMETHOXAZOLE-TMP DS 800-160 MG PO TABS: 1.0000 | ORAL_TABLET | Freq: Two times a day (BID) | ORAL | Status: DC

## 2013-04-30 NOTE — Telephone Encounter (Signed)
Patient seen earlier today and forgot to get refill for her catheters needs refill for her catheter she uses [facility] 14FR please call in to [facility] in  please call pt when done  [phone]

## 2013-05-01 ENCOUNTER — Telehealth: Payer: Self-pay | Admitting: Family Medicine

## 2013-05-01 ENCOUNTER — Encounter: Payer: Self-pay | Admitting: Nurse Practitioner

## 2013-05-01 NOTE — Telephone Encounter (Signed)
Patients boyfriend Scott walked in and left the message that she wants Eber Jones to call her back. Her boyfriend did not know what it was regarding.
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
## 2013-05-01 NOTE — Telephone Encounter (Signed)
Prescription written and sent to front desk. Patient to pick up.

## 2013-05-02 NOTE — Telephone Encounter (Signed)
Pt wanted to know if you have talked to Dr. Gerda Diss about her medicine.

## 2013-05-02 NOTE — Telephone Encounter (Signed)
[facility] x 2 on 05/01/13

## 2013-05-03 ENCOUNTER — Encounter: Payer: Self-pay | Admitting: *Deleted

## 2013-05-03 LAB — WOUND CULTURE

## 2013-05-05 DIAGNOSIS — F418 Other specified anxiety disorders: Secondary | ICD-10-CM | POA: Insufficient documentation

## 2013-05-05 NOTE — Progress Notes (Signed)
[facility] - mailbox full 12/8

## 2013-05-06 ENCOUNTER — Encounter: Payer: Self-pay | Admitting: Nurse Practitioner

## 2013-05-06 ENCOUNTER — Emergency Department (HOSPITAL_COMMUNITY)
Admission: EM | Admit: 2013-05-06 | Discharge: 2013-05-07 | Discharge status: Against Medical Advice | Payer: Medicaid Other | Attending: Emergency Medicine | Admitting: Emergency Medicine

## 2013-05-06 ENCOUNTER — Encounter (HOSPITAL_COMMUNITY): Payer: Self-pay | Admitting: Emergency Medicine

## 2013-05-06 DIAGNOSIS — F17200 Nicotine dependence, unspecified, uncomplicated: Secondary | ICD-10-CM | POA: Insufficient documentation

## 2013-05-06 DIAGNOSIS — Z8739 Personal history of other diseases of the musculoskeletal system and connective tissue: Secondary | ICD-10-CM | POA: Insufficient documentation

## 2013-05-06 DIAGNOSIS — L98491 Non-pressure chronic ulcer of skin of other sites limited to breakdown of skin: Secondary | ICD-10-CM

## 2013-05-06 DIAGNOSIS — K5900 Constipation, unspecified: Secondary | ICD-10-CM | POA: Insufficient documentation

## 2013-05-06 DIAGNOSIS — L98499 Non-pressure chronic ulcer of skin of other sites with unspecified severity: Secondary | ICD-10-CM | POA: Insufficient documentation

## 2013-05-06 DIAGNOSIS — Z792 Long term (current) use of antibiotics: Secondary | ICD-10-CM | POA: Insufficient documentation

## 2013-05-06 DIAGNOSIS — Z8659 Personal history of other mental and behavioral disorders: Secondary | ICD-10-CM | POA: Insufficient documentation

## 2013-05-06 DIAGNOSIS — Z8669 Personal history of other diseases of the nervous system and sense organs: Secondary | ICD-10-CM | POA: Insufficient documentation

## 2013-05-06 DIAGNOSIS — Z86718 Personal history of other venous thrombosis and embolism: Secondary | ICD-10-CM | POA: Insufficient documentation

## 2013-05-06 LAB — COMPREHENSIVE METABOLIC PANEL
ALT: 16 U/L (ref 0–35)
Alkaline Phosphatase: 65 U/L (ref 39–117)
BUN: 10 mg/dL (ref 6–23)
CO2: 25 mEq/L (ref 19–32)
GFR calc Af Amer: >90 mL/min (ref >90)
GFR calc non Af Amer: >90 mL/min (ref >90)
Glucose, Bld: 103 mg/dL — ABNORMAL HIGH (ref 70–99)
Potassium: 3.8 mEq/L (ref 3.5–5.1)
Sodium: 137 mEq/L (ref 135–145)
Total Bilirubin: 0.4 mg/dL (ref 0.3–1.2)

## 2013-05-06 LAB — CBC WITH DIFFERENTIAL/PLATELET
Eosinophils Absolute: 0.2 10*3/uL (ref 0.0–0.7)
Hemoglobin: 14.6 g/dL (ref 12.0–15.0)
Lymphocytes Relative: 17 % (ref 12–46)
Lymphs Abs: 1.9 10*3/uL (ref 0.7–4.0)
MCH: 30.8 pg (ref 26.0–34.0)
MCV: 86.7 fL (ref 78.0–100.0)
Monocytes Relative: 5 % (ref 3–12)
Neutrophils Relative %: 76 % (ref 43–77)
Platelets: 252 10*3/uL (ref 150–400)
RBC: 4.74 MIL/uL (ref 3.87–5.11)
WBC: 10.9 10*3/uL — ABNORMAL HIGH (ref 4.0–10.5)

## 2013-05-06 MED ORDER — POLYETHYLENE GLYCOL 3350 17 G PO PACK
17.0000 g | PACK | ORAL | Status: DC
  Filled 2013-05-06: qty 1

## 2013-05-06 MED ORDER — TRAMADOL HCL 50 MG PO TABS
50.0000 mg | ORAL_TABLET | Freq: Four times a day (QID) | ORAL | Status: DC | PRN
  Filled 2013-05-06: qty 1

## 2013-05-06 NOTE — ED Notes (Signed)
Pt refuses xray/VS

## 2013-05-06 NOTE — ED Notes (Addendum)
Pt states that she has an abscess on her right lower crease of her abdomen and she was taking antibiotic (Bactrium) for 5 days to clear it up but she feels like it is getting worse. Pt states abdomen pain as well.
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
## 2013-05-07 NOTE — ED Notes (Signed)
Pt unhappy with medications refuses to stay.  RN recommended to stay and speak with doctor again.  Pt unhappy- refuses to stay.

## 2013-05-17 NOTE — Telephone Encounter (Signed)
I spoke with Dr. Lorin Picket about her medications; he recommends referral to psych for medication management as we discussed; we received a note back from pain clinic; tried to refer her but they will not accept her as a patient; I recommend she make an appt with Dr. Lorin Picket to discuss especially since we tried to refer her

## 2013-05-19 NOTE — Telephone Encounter (Signed)
Mailbox full unable to leave message.

## 2013-05-19 NOTE — Telephone Encounter (Signed)
Dr Lorin Picket stated he is not comfortable prescribing any narcotics to this patient.

## 2013-06-16 ENCOUNTER — Encounter: Payer: Self-pay | Admitting: Nurse Practitioner

## 2013-06-16 ENCOUNTER — Encounter: Payer: Self-pay | Admitting: Family Medicine

## 2013-06-30 ENCOUNTER — Telehealth: Payer: Self-pay | Admitting: Family Medicine

## 2013-06-30 NOTE — Telephone Encounter (Signed)
Patient says she was supposed to be set up with a pain clinic and behavioral health. Would like to know status of this.

## 2013-06-30 NOTE — Telephone Encounter (Signed)
Would like [name] to call her back before she leaves today.

## 2013-07-04 NOTE — Telephone Encounter (Signed)
I have not had a chance to contact [name]. Can someone please call her to see what she needs? Thanks.

## 2013-07-04 NOTE — Telephone Encounter (Signed)
What would you like for me to ask her? She was calling about being referred to a pain clinic and behavioral health?

## 2013-07-07 ENCOUNTER — Telehealth (HOSPITAL_COMMUNITY): Payer: Self-pay | Admitting: *Deleted

## 2013-07-07 NOTE — Telephone Encounter (Signed)
Pain clinic has tried to get in touch with her; numerous attempts; our referral coordinator sent her a letter on 1/20 asking her to contact pain clinic.  Referral to behavioral health is being worked on; our contact was out of office; Enid DerryBrendale spoke with them on Friday

## 2013-07-07 NOTE — Telephone Encounter (Signed)
[facility] (mailbox is full) 

## 2013-07-08 NOTE — Telephone Encounter (Signed)
[facility] (mailbox is full) 

## 2013-07-09 NOTE — Telephone Encounter (Signed)
Patient stated that she has made an appointment with both the pain clinic and behavioral health. She will call back if she needs us any further.

## 2013-07-31 ENCOUNTER — Encounter (HOSPITAL_COMMUNITY): Payer: Self-pay | Admitting: Psychiatry

## 2013-07-31 ENCOUNTER — Ambulatory Visit (INDEPENDENT_AMBULATORY_CARE_PROVIDER_SITE_OTHER): Payer: 59 | Admitting: Psychiatry

## 2013-07-31 VITALS — BP 100/70

## 2013-07-31 DIAGNOSIS — F4310 Post-traumatic stress disorder, unspecified: Secondary | ICD-10-CM

## 2013-07-31 DIAGNOSIS — F1310 Sedative, hypnotic or anxiolytic abuse, uncomplicated: Secondary | ICD-10-CM

## 2013-07-31 MED ORDER — DULOXETINE HCL 60 MG PO CPEP: 60.0000 mg | ORAL_CAPSULE | Freq: Every day | ORAL | Status: DC
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
## 2013-08-20 ENCOUNTER — Ambulatory Visit (HOSPITAL_COMMUNITY): Payer: Self-pay | Admitting: Psychiatry

## 2013-08-28 ENCOUNTER — Encounter (HOSPITAL_COMMUNITY): Payer: Self-pay | Admitting: Psychiatry

## 2013-08-28 ENCOUNTER — Ambulatory Visit (HOSPITAL_COMMUNITY): Payer: Self-pay | Admitting: Psychiatry

## 2013-09-01 ENCOUNTER — Telehealth: Payer: Self-pay | Admitting: *Deleted

## 2013-09-01 NOTE — Telephone Encounter (Signed)
Wheelchair is messed up. Needs a new script for wheelchair. [facility] to find out which med supplier to send script to.

## 2013-09-02 NOTE — Telephone Encounter (Signed)
[facility]. Voicemail is full.

## 2013-09-02 NOTE — Telephone Encounter (Signed)
Pt states she had her wheelchair for 6 years and she doesn't remember where she got it. She was living somewhere else and got it in [place]. She wants script for wheelchair sent wherever medicaid will pay for. Also [facility]er wheelchair [facility] til she gets her new one because she is not able to use hers at all.

## 2013-09-03 NOTE — Telephone Encounter (Signed)
I will write RX; when she chooses medical supply place to get her wheelchair, she gives them the Rx; they will send me paperwork to sign. This may take some time. They may go ahead and give her the wheelchair if they are confident that it will be approved; that depends on where she goes.

## 2013-09-03 NOTE — Telephone Encounter (Signed)
Patient was notified that script was faxed to  [facility] this morning. Patient verbalized understanding.

## 2013-09-03 NOTE — Telephone Encounter (Signed)
Script for wheelchair faxed to [facility]. [facility]. Voicemail is full.

## 2013-09-12 ENCOUNTER — Telehealth: Payer: Self-pay | Admitting: Family Medicine

## 2013-09-12 NOTE — Telephone Encounter (Signed)
Please see below, staff message received from Lillie FragminEmma Blaylock w/[facility], please put order in Epic with the narrative, questions - you may staff message Nicolasa DuckingEmma     Hey Brendale, I hope you are doing well.   We got the wheelchair order for [name] but need the narrative. Can you have a nurse or Dr. Gerda DissLuking put the order in Epic with the narrative.   They just select the DME and wheelchair and click, click!   Thanks,  Kara MeadEmma

## 2013-09-14 NOTE — Telephone Encounter (Signed)
She makes exam very easy, click click click. But it's not. This patient was last seen in December of 2014. What type of narrative does she need? This patient has not been seen regarding the need for a wheelchair in any recent visit. So if her recent note is needed stating that she is in fact needing a wheelchair based upon a physical exam I would have to see her. If I can simply order a wheelchair because of her paralysis and didn't document that I will be happy to do so. Please find out then let me know thank you

## 2013-09-16 NOTE — Telephone Encounter (Signed)
Sent staff message to [facility] for clarification on what's needed for pt

## 2013-09-26 NOTE — Telephone Encounter (Signed)
Per Kara MeadEmma with [facility]:  [facility], so we have to have that narrative for the need for the wheelchair. It's her insurance. We can't just provide the wheelchair without.  I'm sorry, he will have to see her if he wants to order her a wheelchair. Let mw know if I can do anything else for you!   Called and notified pt that in order to get documentation that the insurance requires, she will need to have an office visit with Dr. Lorin PicketScott so that the wheelchair narrative can be attached to the face to face for the order for her a new wheelchair.  Patient states she will call us to set up appointment for eval for new wheelchair

## 2013-10-23 ENCOUNTER — Telehealth: Payer: Self-pay | Admitting: Family Medicine

## 2013-10-23 NOTE — Telephone Encounter (Signed)
Patient needs a new prescription for a wheelchair. [facility] is requiring that we get a signed note from the doctor stating her medical need for a wheelchair due to paralysis. Can we do this without an appointment? At this time, her wheels won't even turn.

## 2013-10-23 NOTE — Telephone Encounter (Signed)
See 09/12/13 message- Patient's insurance requires face to face visit for new wheelchair. Advised to call back and schedule office visit for eval for new wheelchair per insurance guidelines.

## 2013-11-05 ENCOUNTER — Ambulatory Visit: Payer: Medicaid Other | Admitting: Family Medicine

## 2013-11-20 ENCOUNTER — Ambulatory Visit (INDEPENDENT_AMBULATORY_CARE_PROVIDER_SITE_OTHER): Payer: Medicaid Other | Admitting: Family Medicine

## 2013-11-20 ENCOUNTER — Telehealth: Payer: Self-pay | Admitting: Family Medicine

## 2013-11-20 ENCOUNTER — Encounter: Payer: Self-pay | Admitting: Family Medicine

## 2013-11-20 VITALS — BP 102/64 | Temp 98.6°F | Ht 65.0 in | Wt 161.0 lb

## 2013-11-20 DIAGNOSIS — K5900 Constipation, unspecified: Secondary | ICD-10-CM

## 2013-11-20 DIAGNOSIS — N319 Neuromuscular dysfunction of bladder, unspecified: Secondary | ICD-10-CM | POA: Insufficient documentation

## 2013-11-20 MED ORDER — NEOMYCIN-POLYMYXIN-HC 3.5-10000-1 OT SOLN: 4.0000 [drp] | Freq: Four times a day (QID) | OTIC | Status: DC

## 2013-11-20 MED ORDER — POLYETHYLENE GLYCOL 3350 17 GM/SCOOP PO POWD: ORAL | Status: DC

## 2013-11-20 MED ORDER — TOLTERODINE TARTRATE ER 2 MG PO CP24: 2.0000 mg | ORAL_CAPSULE | Freq: Every day | ORAL | Status: DC
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
## 2013-11-20 NOTE — Telephone Encounter (Signed)
Nurses, please call [facility]. See if they can provide wheelchair to paraplegia. This needs to be a functional wheelchair for paraplegic. The patient is on Medicaid. Please also call [facility] and asked them if they know of any medical supply specialist for wheelchairs for a person on Medicaid. Once you get this information please send it to me. Thank you Dr. Lorin PicketScott

## 2013-11-21 ENCOUNTER — Telehealth: Payer: Self-pay | Admitting: *Deleted

## 2013-11-21 MED ORDER — OXYBUTYNIN CHLORIDE ER 5 MG PO TB24: 5.0000 mg | ORAL_TABLET | Freq: Every day | ORAL | Status: DC

## 2013-11-21 NOTE — Telephone Encounter (Signed)
Try oxybutynin er 5mg , #30, 5 refills, cancel detrol

## 2013-11-21 NOTE — Telephone Encounter (Signed)
[facility] does not provide wheel[facility]airs [facility]. [facility] said they do, but they need an order faxed to them saying what you want. The fax # is: [phone]. They said they would review it and let you know all the info they need for the wheelchair.

## 2013-11-21 NOTE — Telephone Encounter (Signed)
Dc'd Detrol. Sent in Oxybutynin.

## 2013-11-21 NOTE — Telephone Encounter (Signed)
Tolterodine tart er 2mg  cap not covered by insurance. Preferred meds are oxybutynin, toviaz, and vesicare. Would you like to change.

## 2013-11-21 NOTE — Telephone Encounter (Signed)
Please have [facility] work with the patient so she can get a wheelchair that suits her needs. I will be willing to sign additional papers if needed. (make sure to get a number for pt to call to coordinate her getting the wheelchair from [facility]medicaid.) Script done

## 2013-11-21 NOTE — Telephone Encounter (Signed)
Order faxed to [facility]. Pt notified.

## 2013-11-24 ENCOUNTER — Telehealth: Payer: Self-pay | Admitting: Family Medicine

## 2013-11-24 NOTE — Telephone Encounter (Signed)
Medicaid will only cover pt's OXYBUTYNIN CL ER 5mg  tablet if pt tries and fails 2 preferred medicince, please see denial and preferred list in red folder, please advise

## 2013-11-25 MED ORDER — OXYBUTYNIN CHLORIDE 5 MG PO TABS: ORAL_TABLET | ORAL | Status: DC

## 2013-11-25 NOTE — Telephone Encounter (Signed)
Med sent to pharmacy.

## 2013-11-25 NOTE — Telephone Encounter (Signed)
Plain oxybutynin , 1/2 bid, 3 refills, #30

## 2013-12-05 ENCOUNTER — Emergency Department (HOSPITAL_COMMUNITY)
Admission: EM | Admit: 2013-12-05 | Discharge: 2013-12-05 | Disposition: A | Discharge status: Other Acute Inpatient Hospital | Payer: MEDICAID | Attending: Emergency Medicine | Admitting: Emergency Medicine

## 2013-12-05 ENCOUNTER — Encounter (HOSPITAL_COMMUNITY): Payer: Self-pay | Admitting: Emergency Medicine

## 2013-12-05 ENCOUNTER — Inpatient Hospital Stay (HOSPITAL_COMMUNITY)
Admission: AD | Admit: 2013-12-05 | Discharge: 2013-12-09 | DRG: 885 | Disposition: A | Discharge status: Home / Self Care | Payer: MEDICAID | Source: Intra-hospital | Attending: Psychiatry | Admitting: Psychiatry

## 2013-12-05 DIAGNOSIS — N319 Neuromuscular dysfunction of bladder, unspecified: Secondary | ICD-10-CM

## 2013-12-05 DIAGNOSIS — Z87828 Personal history of other (healed) physical injury and trauma: Secondary | ICD-10-CM | POA: Diagnosis not present

## 2013-12-05 DIAGNOSIS — F410 Panic disorder [episodic paroxysmal anxiety] without agoraphobia: Secondary | ICD-10-CM | POA: Diagnosis present

## 2013-12-05 DIAGNOSIS — F329 Major depressive disorder, single episode, unspecified: Secondary | ICD-10-CM

## 2013-12-05 DIAGNOSIS — M549 Dorsalgia, unspecified: Secondary | ICD-10-CM | POA: Diagnosis present

## 2013-12-05 DIAGNOSIS — F4310 Post-traumatic stress disorder, unspecified: Secondary | ICD-10-CM | POA: Diagnosis present

## 2013-12-05 DIAGNOSIS — K5900 Constipation, unspecified: Secondary | ICD-10-CM | POA: Diagnosis present

## 2013-12-05 DIAGNOSIS — G8220 Paraplegia, unspecified: Secondary | ICD-10-CM | POA: Diagnosis present

## 2013-12-05 DIAGNOSIS — Z8249 Family history of ischemic heart disease and other diseases of the circulatory system: Secondary | ICD-10-CM

## 2013-12-05 DIAGNOSIS — G8929 Other chronic pain: Secondary | ICD-10-CM | POA: Diagnosis present

## 2013-12-05 DIAGNOSIS — F1510 Other stimulant abuse, uncomplicated: Secondary | ICD-10-CM | POA: Diagnosis not present

## 2013-12-05 DIAGNOSIS — IMO0002 Reserved for concepts with insufficient information to code with codable children: Secondary | ICD-10-CM | POA: Insufficient documentation

## 2013-12-05 DIAGNOSIS — Z833 Family history of diabetes mellitus: Secondary | ICD-10-CM

## 2013-12-05 DIAGNOSIS — Z86718 Personal history of other venous thrombosis and embolism: Secondary | ICD-10-CM | POA: Diagnosis not present

## 2013-12-05 DIAGNOSIS — F1210 Cannabis abuse, uncomplicated: Secondary | ICD-10-CM | POA: Diagnosis present

## 2013-12-05 DIAGNOSIS — F32A Depression, unspecified: Secondary | ICD-10-CM | POA: Diagnosis present

## 2013-12-05 DIAGNOSIS — F3130 Bipolar disorder, current episode depressed, mild or moderate severity, unspecified: Secondary | ICD-10-CM | POA: Diagnosis present

## 2013-12-05 DIAGNOSIS — M4120 Other idiopathic scoliosis, site unspecified: Secondary | ICD-10-CM | POA: Diagnosis present

## 2013-12-05 DIAGNOSIS — F1310 Sedative, hypnotic or anxiolytic abuse, uncomplicated: Secondary | ICD-10-CM | POA: Diagnosis present

## 2013-12-05 DIAGNOSIS — X838XXA Intentional self-harm by other specified means, initial encounter: Secondary | ICD-10-CM | POA: Insufficient documentation

## 2013-12-05 DIAGNOSIS — R000 Tachycardia, unspecified: Secondary | ICD-10-CM | POA: Insufficient documentation

## 2013-12-05 DIAGNOSIS — F418 Other specified anxiety disorders: Secondary | ICD-10-CM

## 2013-12-05 DIAGNOSIS — G4700 Insomnia, unspecified: Secondary | ICD-10-CM | POA: Diagnosis present

## 2013-12-05 DIAGNOSIS — F17200 Nicotine dependence, unspecified, uncomplicated: Secondary | ICD-10-CM | POA: Insufficient documentation

## 2013-12-05 DIAGNOSIS — X789XXA Intentional self-harm by unspecified sharp object, initial encounter: Secondary | ICD-10-CM

## 2013-12-05 DIAGNOSIS — Z3202 Encounter for pregnancy test, result negative: Secondary | ICD-10-CM | POA: Insufficient documentation

## 2013-12-05 DIAGNOSIS — F3289 Other specified depressive episodes: Secondary | ICD-10-CM | POA: Diagnosis not present

## 2013-12-05 DIAGNOSIS — F1120 Opioid dependence, uncomplicated: Secondary | ICD-10-CM | POA: Diagnosis present

## 2013-12-05 DIAGNOSIS — S61509A Unspecified open wound of unspecified wrist, initial encounter: Secondary | ICD-10-CM | POA: Diagnosis not present

## 2013-12-05 DIAGNOSIS — F1994 Other psychoactive substance use, unspecified with psychoactive substance-induced mood disorder: Secondary | ICD-10-CM

## 2013-12-05 DIAGNOSIS — M419 Scoliosis, unspecified: Secondary | ICD-10-CM

## 2013-12-05 DIAGNOSIS — S61512A Laceration without foreign body of left wrist, initial encounter: Secondary | ICD-10-CM

## 2013-12-05 DIAGNOSIS — F411 Generalized anxiety disorder: Secondary | ICD-10-CM | POA: Diagnosis present

## 2013-12-05 DIAGNOSIS — N390 Urinary tract infection, site not specified: Secondary | ICD-10-CM | POA: Diagnosis present

## 2013-12-05 DIAGNOSIS — F1121 Opioid dependence, in remission: Secondary | ICD-10-CM

## 2013-12-05 DIAGNOSIS — Z79899 Other long term (current) drug therapy: Secondary | ICD-10-CM | POA: Diagnosis not present

## 2013-12-05 DIAGNOSIS — Z008 Encounter for other general examination: Secondary | ICD-10-CM | POA: Diagnosis present

## 2013-12-05 HISTORY — DX: Bipolar disorder, unspecified: F31.9

## 2013-12-05 LAB — COMPREHENSIVE METABOLIC PANEL
ALT: 9 U/L (ref 0–35)
ANION GAP: 13 (ref 5–15)
AST: 15 U/L (ref 0–37)
Albumin: 4.2 g/dL (ref 3.5–5.2)
Alkaline Phosphatase: 70 U/L (ref 39–117)
BILIRUBIN TOTAL: 0.5 mg/dL (ref 0.3–1.2)
BUN: 8 mg/dL (ref 6–23)
CO2: 26 meq/L (ref 19–32)
CREATININE: 0.65 mg/dL (ref 0.50–1.10)
Calcium: 9.7 mg/dL (ref 8.4–10.5)
Chloride: 103 mEq/L (ref 96–112)
GFR calc Af Amer: >90 mL/min (ref >90)
GLUCOSE: 91 mg/dL (ref 70–99)
Potassium: 4.2 mEq/L (ref 3.7–5.3)
Sodium: 142 mEq/L (ref 137–147)
Total Protein: 7.8 g/dL (ref 6.0–8.3)

## 2013-12-05 LAB — RAPID URINE DRUG SCREEN, HOSP PERFORMED
Amphetamines: POSITIVE — AB
BENZODIAZEPINES: POSITIVE — AB
Barbiturates: NOT DETECTED
Cocaine: NOT DETECTED
Opiates: NOT DETECTED
Tetrahydrocannabinol: POSITIVE — AB

## 2013-12-05 LAB — URINALYSIS, ROUTINE W REFLEX MICROSCOPIC
BILIRUBIN URINE: NEGATIVE
Glucose, UA: NEGATIVE mg/dL
KETONES UR: 15 mg/dL — AB
Leukocytes, UA: NEGATIVE
Nitrite: POSITIVE — AB
Protein, ur: 30 mg/dL — AB
Specific Gravity, Urine: 1.025 (ref 1.005–1.030)
Urobilinogen, UA: 0.2 mg/dL (ref 0.0–1.0)
pH: 6 (ref 5.0–8.0)

## 2013-12-05 LAB — CBC
HCT: 43.7 % (ref 36.0–46.0)
Hemoglobin: 15.5 g/dL — ABNORMAL HIGH (ref 12.0–15.0)
MCH: 29.8 pg (ref 26.0–34.0)
MCHC: 35.5 g/dL (ref 30.0–36.0)
MCV: 83.9 fL (ref 78.0–100.0)
Platelets: 279 10*3/uL (ref 150–400)
RBC: 5.21 MIL/uL — AB (ref 3.87–5.11)
RDW: 11.5 % (ref 11.5–15.5)
WBC: 12.4 10*3/uL — ABNORMAL HIGH (ref 4.0–10.5)

## 2013-12-05 LAB — PREGNANCY, URINE: Preg Test, Ur: NEGATIVE

## 2013-12-05 LAB — ETHANOL: Alcohol, Ethyl (B): <11 mg/dL (ref 0–11)

## 2013-12-05 LAB — URINE MICROSCOPIC-ADD ON

## 2013-12-05 MED ORDER — NICOTINE 21 MG/24HR TD PT24
21.0000 mg | MEDICATED_PATCH | Freq: Once | TRANSDERMAL | Status: DC
  Administered 2013-12-05: 21 mg via TRANSDERMAL

## 2013-12-05 MED ORDER — NICOTINE 21 MG/24HR TD PT24
MEDICATED_PATCH | TRANSDERMAL | Status: AC
  Administered 2013-12-05: 21 mg via TRANSDERMAL
  Filled 2013-12-05: qty 1

## 2013-12-05 MED ORDER — OXYBUTYNIN CHLORIDE 5 MG PO TABS
2.5000 mg | ORAL_TABLET | Freq: Two times a day (BID) | ORAL | Status: DC
  Administered 2013-12-06 – 2013-12-09 (×7): 2.5 mg via ORAL
  Filled 2013-12-05 (×12): qty 0.5

## 2013-12-05 MED ORDER — MAGNESIUM HYDROXIDE 400 MG/5ML PO SUSP: 30.0000 mL | Freq: Every day | ORAL | Status: DC | PRN

## 2013-12-05 MED ORDER — POLYETHYLENE GLYCOL 3350 17 G PO PACK
17.0000 g | PACK | Freq: Every day | ORAL | Status: DC
Start: 2013-12-06 — End: 2013-12-06
  Filled 2013-12-05 (×3): qty 1

## 2013-12-05 MED ORDER — ACETAMINOPHEN 500 MG PO TABS
1000.0000 mg | ORAL_TABLET | Freq: Once | ORAL | Status: AC
Start: 2013-12-05 — End: 2013-12-05
  Administered 2013-12-05: 1000 mg via ORAL
  Filled 2013-12-05: qty 2

## 2013-12-05 MED ORDER — ALUM & MAG HYDROXIDE-SIMETH 200-200-20 MG/5ML PO SUSP: 30.0000 mL | ORAL | Status: DC | PRN

## 2013-12-05 MED ORDER — ZOLPIDEM TARTRATE 5 MG PO TABS
5.0000 mg | ORAL_TABLET | Freq: Once | ORAL | Status: AC
  Administered 2013-12-05: 5 mg via ORAL
  Filled 2013-12-05: qty 1

## 2013-12-05 MED ORDER — ACETAMINOPHEN 325 MG PO TABS
650.0000 mg | ORAL_TABLET | Freq: Four times a day (QID) | ORAL | Status: DC | PRN
  Administered 2013-12-07: 650 mg via ORAL

## 2013-12-05 NOTE — ED Notes (Signed)
Attempted to call report. They will call back.
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
## 2013-12-05 NOTE — ED Notes (Addendum)
Patient tearful, upset, stating she doesn't want to be sent anywhere.  States she wants to see her son, whom she has not seen in a month.  States he lives with his father in [place] and she hasn't been able to see him.  States she need her "ex" boyfriend to bring her wheelchair to hospital for her.  Requested self-cath.  Provided materials for this and she threw supplies on floor once I left her. She stated, "It flew open all over the floor when I opened it."  Provided second kit, opened it for her and layed supplies out.  Curtain pulled for privacy to cath.  She is angry and states "you don't understand my life, I'm not going to kill myself.  It's not my fault."   Informed her I would speak w/Dr. Lacinda Axon to find out what telepsych recommendations are.  Spoke w/Dr. Lacinda Axon who stated inpatient treatment was recommended and she would need to be IVCed if she is not agreeable.  Informed him that patient is not agreeable.  IVC papers notarized and faxed to magistrate.  Requested that he speak w/patient.

## 2013-12-05 NOTE — ED Notes (Signed)
Pt asked to use phone to call mother to make sure she got the kids up.  Pt was overheard calling her boyfriend to come to department.  "Just tell them you're my brother and you have to bring me a key."  Reinforced limits with pt and removed phone from room.  Pt demanding that staff allow pt's boyfriend to come back into department.  Again, reinforced limits with pt and reinforced visitor policy.
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
## 2013-12-05 NOTE — ED Notes (Signed)
Patient's mother called to check on her.  She stated patient needs help. [facility], was [facility] and laughed that "she worked the system and made contact w/drug dealers". States she doe not take her bipolar meds and is currently on Cyboxan.  States patient is extremely manipulative and "if her mouth is open she is lying."  States patient's son lives with his father, but she (the grandmother) keeps him every weekend and has taken him to see her. Verifies that patient has not seen her son in a month d/t her behavior and living conditions.  Mother states the patient's grandmother had psychiatric issues, threatened suicide multiple times and succeeded in suicide 2 years ago.  States the patient "is her grandmother made over" and has made multiple suicide threats over the years.
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
## 2013-12-05 NOTE — BH Assessment (Signed)
[facility] Ren[facility], NP pending a 500 hall bed. Writer informed [facility] nursing staff and EDP that if patient is not agreeable IVC will need to be initiated.   Writer will notify [facility] staff when bed becomes available.

## 2013-12-05 NOTE — ED Notes (Signed)
telepsych in progress 
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
## 2013-12-05 NOTE — ED Notes (Signed)
[employer] [place] [facility] [place] [facility]

## 2013-12-05 NOTE — Progress Notes (Signed)
Patient ID: Theresa GeeHannah B Shaw, female   DOB: 11/21/1990, 23 y.o.   MRN: [id number]  Patient in her room, appearing drowsy and laying her head down on her pillow. Pt states "If you can't give me my Suboxone then I may as well leave. I can't go without it". Pt states she doesn't take her Zoloft and hasn't taken her Seroquel and has no idea what the dose is. Drenda FreezeFran, NP notified of request.

## 2013-12-06 ENCOUNTER — Encounter (HOSPITAL_COMMUNITY): Payer: Self-pay | Admitting: Psychiatry

## 2013-12-06 MED ORDER — NICOTINE 21 MG/24HR TD PT24
MEDICATED_PATCH | TRANSDERMAL | Status: AC
  Administered 2013-12-06: 13:00:00
  Filled 2013-12-06: qty 1

## 2013-12-06 MED ORDER — SERTRALINE HCL 50 MG PO TABS
50.0000 mg | ORAL_TABLET | Freq: Every day | ORAL | Status: DC
  Administered 2013-12-06 – 2013-12-07 (×2): 50 mg via ORAL
  Filled 2013-12-06 (×3): qty 1

## 2013-12-06 MED ORDER — BUPRENORPHINE HCL 2 MG SL SUBL
4.0000 mg | SUBLINGUAL_TABLET | Freq: Three times a day (TID) | SUBLINGUAL | Status: DC
  Administered 2013-12-06 – 2013-12-09 (×10): 4 mg via SUBLINGUAL
  Filled 2013-12-06 (×10): qty 2

## 2013-12-06 MED ORDER — GLYCERIN (LAXATIVE) 2.1 G RE SUPP
1.0000 | Freq: Every day | RECTAL | Status: DC | PRN
  Filled 2013-12-06: qty 1

## 2013-12-06 MED ORDER — BUPRENORPHINE HCL 2 MG SL SUBL: 4.0000 mg | SUBLINGUAL_TABLET | Freq: Every day | SUBLINGUAL | Status: DC

## 2013-12-06 MED ORDER — QUETIAPINE FUMARATE 100 MG PO TABS
100.0000 mg | ORAL_TABLET | Freq: Every day | ORAL | Status: DC
  Administered 2013-12-06 – 2013-12-08 (×3): 100 mg via ORAL
  Filled 2013-12-06: qty 3
  Filled 2013-12-06 (×4): qty 1

## 2013-12-06 MED ORDER — HYDROXYZINE HCL 25 MG PO TABS
25.0000 mg | ORAL_TABLET | Freq: Three times a day (TID) | ORAL | Status: DC | PRN
  Administered 2013-12-06 (×2): 25 mg via ORAL
  Filled 2013-12-06: qty 1
  Filled 2013-12-06: qty 9
  Filled 2013-12-06: qty 1

## 2013-12-06 NOTE — Progress Notes (Signed)
[facility] Group Notes:  (Nursing/MHT/Case Management/Adjunct)  Date:  12/06/2013  Time:  9:39 PM  Type of Therapy:  Psychoeducational Skills  Participation Level:  Active  Participation Quality:  Appropriate  Affect:  Flat  Cognitive:  Appropriate  Insight:  Improving  Engagement in Group:  Improving  Modes of Intervention:  Education  Summary of Progress/Problems: The patient shared with the group that she had a good day overall. She states that she has been working on taking time for herself. In addition, she briefly mentioned that she has been thinking about her relationship at home but would not go into detail. As a theme for the day, her coping skill is to increase her communication with others.   Theresa CocaGOODMAN, [name] 12/06/2013, 9:39 PM

## 2013-12-06 NOTE — Progress Notes (Addendum)
Psychoeducational Group Note  Date:  01/06/2012 Time:  0930  Group Topic/Focus:  Identifying Goals:   The focus of this group is to help patients identify goals they want to accomplish and healthy behaviors necessary to accomplish these goals. Participation Level: did not attend Participation Quality: did not attend Affect: did not attend Cognitive:    Insight:  Did not attend  Engagement in Group: did not attend  Additional Comments:   P Keven Osborn RN [facility]

## 2013-12-06 NOTE — Progress Notes (Signed)
Patient ID: Allyne GeeHannah B Fryman, female   DOB: 08/13/1990, 23 y.o.   MRN: [id number] Patient informed unit nurse that [facility]  The [place] office of [facility] was contacted at [phone] to confirm patient's dose of Suboxone.  There was no answer at the clinic and the recording states their hours of operation are Mon-Fri 8:30am-5:00pm. Unable to confirm patient's dose.   Alberteen SamFran Hobson, FNP-BC

## 2013-12-06 NOTE — Progress Notes (Signed)
Psychoeducational Group Note  Date:  01/06/2012 Time: 1015  Group Topic/Focus:  Identifying Needs:   The focus of this group is to help patients identify their personal needs that have been historically problematic and identify healthy behaviors to address their needs.  Participation Level:Pt did not attend.   Participation QualityN/A :Affect N/A :Cognitive:  N/A  Insight: N/A  Engagement in Group: N/A   Additional Comments:
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
## 2013-12-06 NOTE — Progress Notes (Addendum)
Patient ID: Theresa Shaw, female   DOB: 06/23/1990, 23 y.o.   MRN: [id number] D: patient lying in bed this morning complaining of withdrawal symptoms from suboxone.  Patient has chills, aches and overall malaise.  Patient is prescribed suboxone from her pain clinic which was closed today.  [facility] in [place] was contacted.  Her prescription was verified as suboxone every 8 hrs.  It was last filled on 11/10/13.  Patient states, "I really shouldn't be here.  My boyfriend and I took some medication and we didn't know that it would affect us like that.  We never argue.  He packed his backs and said he was leaving.  I just cut a small place on my wrist.  I'm not depressed or suicidal and I feel like crap because I don't have my suboxone."  Order for subutex was given for patient p8h.  She was given her first dose at 1000.  Patient denies any SI/HI/AVH.  She is appreciative of staff's assistance.  A: continue to monitor and assess patient.  Safety checks completed every 15 minutes per protocol.  R: Patient is receptive to staff; isolative in room for the time being.   Update 1358:  Patient states she is "very uncomfortable here."  She is bound to a wheelchair to do paralysis of her legs.  She states the bed does not suit her needs and she is having a difficult time with the wheelchair we have given her.  Staff assisted patient to the bathtub this morning and patient became very anxious and tearful.  She continues to state, "I don't need to be here.  I made one little mistake.  I don't belong here."  Vistaril given with improved results.
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
## 2013-12-07 MED ORDER — CLOTRIMAZOLE 2 % VA CREA: 1.0000 | TOPICAL_CREAM | Freq: Every day | VAGINAL | Status: DC

## 2013-12-07 MED ORDER — ONDANSETRON HCL 4 MG PO TABS
4.0000 mg | ORAL_TABLET | Freq: Two times a day (BID) | ORAL | Status: DC
  Administered 2013-12-07 – 2013-12-09 (×5): 4 mg via ORAL
  Filled 2013-12-07 (×11): qty 1
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
## 2013-12-07 NOTE — Progress Notes (Signed)
[facility]  12/07/2013 10:31 AM Theresa GeeHannah B Shaw  MRN:  [id number] Subjective:  I can't take Zoloft  Pt reports she took Zoloft and it is causing her to shake and feel nausea. Denies GI upset. Reports she doesn't think symptoms are withdrawal from any substances. Denies depression and anxiety and wants to stop Zoloft. States arm is healing. Pt is attending groups and finds them helpful. Pt would like to use her own wheelchair from home.   Pt spoke with family and they want her home. States she wants to leave and that nothing is wrong with her.   Diagnosis:  Bipolar disorder- current state unspecified, PTSD, Opiate dependence on Suboxone, Cannabis abuse, Benzo abuse    Total Time spent with patient: 30 minutes    ADL's:  Intact  Sleep: Fair  Appetite:  Fair  Suicidal Ideation:  Plan:  denies Intent:  denies Means:  denies Homicidal Ideation:  Plan:  denies Intent:  denies Means:  denies AEB (as evidenced by)  Psychiatric Specialty Exam: Physical Exam  Review of Systems  Constitutional: Positive for malaise/fatigue.  Respiratory: Negative for cough.   Cardiovascular: Negative for chest pain.  Gastrointestinal: Positive for nausea and constipation.  Musculoskeletal: Negative for back pain and neck pain.  Neurological: Positive for dizziness.  Psychiatric/Behavioral: Positive for substance abuse. Negative for depression, suicidal ideas and hallucinations. The patient is not nervous/anxious and does not have insomnia.     Blood pressure 100/64, pulse 65, temperature 97.3 F (36.3 C), temperature source Oral, resp. rate 16, height 5\' 5"  (1.651 m), weight 73.483 kg (162 lb).Body mass index is 26.96 kg/(m^2).  General Appearance: Casual and Neat  Eye Contact::  Fair  Speech:  Clear and Coherent  Volume:  Normal  Mood:  Euthymic  Affect:  Depressed  Thought Process:  Goal Directed  Orientation:  Full (Time, Place, and Person)  Thought Content:  WDL  Suicidal  Thoughts:  No  Homicidal Thoughts:  No  Memory:  Immediate;   Good Recent;   Good Remote;   Good  Judgement:  Fair  Insight:  Shallow  Psychomotor Activity:  Normal  Concentration:  Good  Recall:  Good  Fund of Knowledge:Fair  Language: Good  Akathisia:  No  Handed:  Right  AIMS (if indicated):     Assets:  Communication Skills Desire for Improvement  Sleep:  Number of Hours: 6.75   Musculoskeletal: Strength & Muscle Tone: atrophy Gait & Station: unable to stand Patient leans: N/A  Current Medications: Current Facility-Administered Medications  Medication Dose Route Frequency Provider Last Rate Last Dose  . acetaminophen (TYLENOL) tablet 650 mg  650 mg Oral Q6H PRN Kristeen MansFran E Hobson, NP      . alum & mag hydroxide-simeth (MAALOX/MYLANTA) 200-200-20 MG/5ML suspension 30 mL  30 mL Oral Q4H PRN Kristeen MansFran E Hobson, NP      . buprenorphine (SUBUTEX) SL tablet 4 mg  4 mg Sublingual 3 times per day Oletta DarterSalina Breon Diss, MD   4 mg at 12/07/13 0842  . Glycerin (Adult) 2.1 G suppository 1 suppository  1 suppository Rectal Daily PRN Kristeen MansFran E Hobson, NP      . hydrOXYzine (ATARAX/VISTARIL) tablet 25 mg  25 mg Oral TID PRN Oletta DarterSalina Breella Vanostrand, MD   25 mg at 12/06/13 2112  . magnesium hydroxide (MILK OF MAGNESIA) suspension 30 mL  30 mL Oral Daily PRN Kristeen MansFran E Hobson, NP      . oxybutynin (DITROPAN) tablet 2.5 mg  2.5 mg Oral BID  Kristeen Mans, NP   2.5 mg at 12/07/13 0842  . QUEtiapine (SEROQUEL) tablet 100 mg  100 mg Oral QHS Oletta Darter, MD   100 mg at 12/06/13 2112  . sertraline (ZOLOFT) tablet 50 mg  50 mg Oral Daily Oletta Darter, MD   50 mg at 12/07/13 1610    Lab Results: No results found for this or any previous visit (from the past 48 hour(s)).  Physical Findings: AIMS: Facial and Oral Movements Muscles of Facial Expression: None, normal Lips and Perioral Area: None, normal Jaw: None, normal Tongue: None, normal,Extremity Movements Upper (arms, wrists, hands, fingers): None, normal Lower  (legs, knees, ankles, toes): None, normal, Trunk Movements Neck, shoulders, hips: None, normal, Overall Severity Severity of abnormal movements (highest score from questions above): None, normal Incapacitation due to abnormal movements: None, normal Patient's awareness of abnormal movements (rate only patient's report): No Awareness, Dental Status Current problems with teeth and/or dentures?: No Does patient usually wear dentures?: No  CIWA:    COWS:     Assessment: Still unclear if pt attempted suicide or if act was due to drug intoxication. Per mother pt is in need of help.  Treatment Plan Summary: Daily contact with patient to assess and evaluate symptoms and progress in treatment Medication management  Plan: - d/c Zoloft due to SE.  -Seroquel 100mg  po qHS for mood  -continue Subutex 4mg  q8 hrs  -encourage pt to attend groups   Medical Decision Making Problem Points:  Established problem, stable/improving (1) and Review of psycho-social stressors (1) Data Points:  Review of medication regiment & side effects (2) Review of new medications or change in dosage (2)  I certify that inpatient services furnished can reasonably be expected to improve the patient's condition.   Oletta Darter 12/07/2013, 10:31 AM

## 2013-12-07 NOTE — Progress Notes (Signed)
D: pt sitting in room. Pt stated she feels as though she has a virus. Pt stated she has had diarrhea and feeling nauseas all evening. Pt stated her son has a virus and fears she may have caught it before being admitted. denies si/hi/avh and pain. Pt stated, " i just want to go home." A: Zofran and ginger ale given for nausea. Support and encouragement offered. scheduled medications given. q 15 min safety checks R: pt remains safe on unit. No signs of distress noted.

## 2013-12-07 NOTE — Progress Notes (Signed)
Pt had an episode in the dayroom. Pt began to shake in chair and unable to respond to staff. Pt drooling at the mouth. Pt came around shortly after and began to get aggressive with staff. Pt pupils dilated. NP notified. Vitals taken. Once pt came around pt accused staff of lying about episode in dayroom . Pt became verbally aggressive and having crying spells.

## 2013-12-07 NOTE — B[facility] Group Notes (Signed)
B[facility] Group Notes:  (Clinical Social Work)  12/07/2013   1:15-2:15PM  Summary of Progress/Problems:  The main focus of today's process group was to   identify the patient's current support system and decide on other supports that can be put in place.  The picture on workbook was used to discuss why additional supports are needed.  An emphasis was placed on using counselor, doctor, therapy groups, 12-step groups, and problem-specific support groups to expand supports.   There was also an extensive discussion about what constitutes a healthy support versus an unhealthy support.  The patient expressed full comprehension of the concepts presented.  Current health supports include her best friend, and she is not sure at this point what she is willing to add.  Type of Therapy:  Process Group  Participation Level:  Active  Participation Quality:  Attentive   Affect:  Blunted  Cognitive:  Appropriate and Oriented  Insight:  Limited  Engagement in Therapy:  Improving  Modes of Intervention:  Education,  Support and Processing  Ambrose MantleMareida Grossman-Orr, LCSW 12/07/2013, 4:00pm

## 2013-12-07 NOTE — Progress Notes (Signed)
Patient ID: Theresa Shaw, female   DOB: 08/19/1990, 23 y.o.   MRN: [id number]  D: Patient pleasant and cooperative with care. Pt out in milieu with peers. Pt brightens on approach. A: Q 15 minute safety checks, encourage staff/peer interaction and group participation. Administer medications as ordered by MD.  R: Pt denies SI or plans to harm herself at this time.

## 2013-12-07 NOTE — Progress Notes (Signed)
Psychoeducational Group Note  Psychoeducational Group Note  Date: 12/07/2013 Time:  0930 Group Topic/Focus:  Gratefulness:  The focus of this group is to help patients identify what two things they are most grateful for in their lives. What helps ground them and to center them on their work to their recovery.  Participation Level: Did not attend Dione HousekeeperJudge, Olubunmi Rothenberger A

## 2013-12-07 NOTE — Progress Notes (Signed)
Adult Psychoeducational Group Note  Date:  12/07/2013 Time:  8:45 PM  Group Topic/Focus:  Wrap-Up Group:   The focus of this group is to help patients review their daily goal of treatment and discuss progress on daily workbooks.  Participation Level:  Did Not Attend  Participation Quality:  Did not attend  Affect:  Did not attend  Cognitive:  Did not attend  Insight: None  Engagement in Group:  None  Modes of Intervention:  Discussion, Education and Support  Additional Comments:  Pt did not attend this group.  Malachy MoanJeffers, Finleigh Cheong S 12/07/2013, 8:45 PM

## 2013-12-07 NOTE — Progress Notes (Signed)
Psychoeducational Group Note  Date:  12/07/2013 Time:  1015  Group Topic/Focus:  Making Healthy Choices:   The focus of this group is to help patients identify negative/unhealthy choices they were using prior to admission and identify positive/healthier coping strategies to replace them upon discharge.  Participation Level:  Active  Participation Quality:  Appropriate  Affect:  Appropriate  Cognitive:  Oriented  Insight:  Improving  Engagement in Group:  Engaged  Additional Comments:    [name] A 12/07/2013 

## 2013-12-08 DIAGNOSIS — F1310 Sedative, hypnotic or anxiolytic abuse, uncomplicated: Secondary | ICD-10-CM

## 2013-12-08 DIAGNOSIS — F1210 Cannabis abuse, uncomplicated: Secondary | ICD-10-CM

## 2013-12-08 DIAGNOSIS — F1121 Opioid dependence, in remission: Secondary | ICD-10-CM

## 2013-12-08 DIAGNOSIS — F4310 Post-traumatic stress disorder, unspecified: Secondary | ICD-10-CM

## 2013-12-08 DIAGNOSIS — F319 Bipolar disorder, unspecified: Secondary | ICD-10-CM

## 2013-12-08 LAB — BASIC METABOLIC PANEL
ANION GAP: 15 (ref 5–15)
BUN: 5 mg/dL — ABNORMAL LOW (ref 6–23)
CHLORIDE: 100 meq/L (ref 96–112)
CO2: 28 mEq/L (ref 19–32)
Calcium: 9.6 mg/dL (ref 8.4–10.5)
Creatinine, Ser: 0.66 mg/dL (ref 0.50–1.10)
GFR calc Af Amer: >90 mL/min (ref >90)
GFR calc non Af Amer: >90 mL/min (ref >90)
Glucose, Bld: 111 mg/dL — ABNORMAL HIGH (ref 70–99)
POTASSIUM: 3.6 meq/L — AB (ref 3.7–5.3)
Sodium: 143 mEq/L (ref 137–147)

## 2013-12-08 LAB — CBC
HCT: 41.4 % (ref 36.0–46.0)
Hemoglobin: 14.7 g/dL (ref 12.0–15.0)
MCH: 29.8 pg (ref 26.0–34.0)
MCHC: 35.5 g/dL (ref 30.0–36.0)
MCV: 83.8 fL (ref 78.0–100.0)
PLATELETS: 272 10*3/uL (ref 150–400)
RBC: 4.94 MIL/uL (ref 3.87–5.11)
RDW: 11.4 % — ABNORMAL LOW (ref 11.5–15.5)
WBC: 11.9 10*3/uL — AB (ref 4.0–10.5)

## 2013-12-08 MED ORDER — CLOTRIMAZOLE 1 % VA CREA
1.0000 | TOPICAL_CREAM | Freq: Every day | VAGINAL | Status: DC
  Filled 2013-12-08: qty 45

## 2013-12-08 MED ORDER — SULFAMETHOXAZOLE-TMP DS 800-160 MG PO TABS
1.0000 | ORAL_TABLET | Freq: Two times a day (BID) | ORAL | Status: DC
  Administered 2013-12-08 – 2013-12-09 (×2): 1 via ORAL
  Filled 2013-12-08 (×2): qty 1
  Filled 2013-12-08: qty 12
  Filled 2013-12-08 (×2): qty 1
  Filled 2013-12-08: qty 12
  Filled 2013-12-08 (×2): qty 1

## 2013-12-08 NOTE — Progress Notes (Signed)
Patient ID: Theresa Shaw, female   DOB: 06/18/1990, 23 y.o.   MRN: [id number] D Patient says she is ready to go home as she feels her fiance who is at home can help her and she is more comfortable at home.  Says she doesn't feel great physically and is resting in bed.  Her BP was low this am and she is trying to drink more fluid.  Gave patient a call bell and encouraged her to call for assistance if needed.  Gave her two more pillows to use to reposition her lower limbs.  Patient says she was nauseated and had diarrhea yesterday, but has not had any today.  Gave patient pads and a diaper to use if she feels the need.  Patient is very self sufficient in her care.  She plans to catheterize herself later this morning.
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
## 2013-12-08 NOTE — [facility] Group Notes (Signed)
[facility]   12/08/2013  8:45 AM  Participation Quality:  Did Not Attend - pt sleeping in her room  Theresa IvanChelsea Horton, LCSW 12/08/2013 9:46 AM

## 2013-12-08 NOTE — Progress Notes (Signed)
Adult Psychoeducational Group Note  Date:  12/08/2013 Time:  8:30pm Group Topic/Focus:  Wrap-Up Group:   The focus of this group is to help patients review their daily goal of treatment and discuss progress on daily workbooks.  Participation Level:  Active  Participation Quality:  Appropriate and Attentive  Affect:  Appropriate  Cognitive:  Alert and Appropriate  Insight: Appropriate  Engagement in Group:  Engaged  Modes of Intervention:  Discussion  Additional Comments:  Pt. Was attentive and appropriate during tonight's group discussion. Pt stated that today was a good day and that she is ready for discharge tomorrow. Pt stated that she is going to take better care of herself and stay med compliant.   Theresa Shaw, Theresa Shaw 12/08/2013, 9:37 PM
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
## 2013-12-08 NOTE — Progress Notes (Signed)
Patient stated she missed appointment with pain clinic today.   Will discuss follow up appointments with SW/staff for discharge.
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
## 2013-12-09 DIAGNOSIS — F3130 Bipolar disorder, current episode depressed, mild or moderate severity, unspecified: Principal | ICD-10-CM

## 2013-12-09 MED ORDER — SULFAMETHOXAZOLE-TMP DS 800-160 MG PO TABS: 1.0000 | ORAL_TABLET | Freq: Two times a day (BID) | ORAL | Status: DC

## 2013-12-09 MED ORDER — OXYBUTYNIN CHLORIDE 5 MG PO TABS: ORAL_TABLET | ORAL | Status: DC

## 2013-12-09 MED ORDER — QUETIAPINE FUMARATE 100 MG PO TABS: 100.0000 mg | ORAL_TABLET | Freq: Every day | ORAL | Status: DC

## 2013-12-09 MED ORDER — BUPRENORPHINE HCL 2 MG SL SUBL: 4.0000 mg | SUBLINGUAL_TABLET | Freq: Three times a day (TID) | SUBLINGUAL | Status: DC

## 2013-12-09 MED ORDER — POLYETHYLENE GLYCOL 3350 17 GM/SCOOP PO POWD: ORAL | Status: DC

## 2013-12-09 MED ORDER — HYDROXYZINE HCL 25 MG PO TABS: 25.0000 mg | ORAL_TABLET | Freq: Three times a day (TID) | ORAL | Status: DC | PRN

## 2013-12-09 NOTE — Tx Team (Signed)
Interdisciplinary Treatment Plan Update (Adult)  Date: 12/09/2013  Time Reviewed:  9:45 AM  Progress in Treatment: Attending groups: Yes Participating in groups:  Yes Taking medication as prescribed:  Yes Tolerating medication:  Yes Family/Significant othe contact made: Yes, with pt's fiance Patient understands diagnosis:  Yes Discussing patient identified problems/goals with staff:  Yes Medical problems stabilized or resolved:  Yes Denies suicidal/homicidal ideation: Yes Issues/concerns per patient self-inventory:  Yes Other:  New problem(s) identified: N/A  Discharge Plan or Barriers: [facility] medication [facility] and therapy.    Reason for Continuation of Hospitalization: Stable to d/c today  Comments: N/A  Estimated length of stay: D/C today  For review of initial/current patient goals, please see plan of care.  Attendees: Patient:     Family:     Physician:  Dr. Jama Flavorsobos 12/09/2013 9:46 AM   Nursing:  Quintella ReichertBeverly Knight, RN 12/09/2013 9:46 AM   Clinical Social Worker:  Reyes Ivanhelsea Horton, LCSW 12/09/2013 9:46 AM   Other: Juline PatchQuylle Hodnett, LCSW 12/09/2013 9:46 AM   Other:  Neill Loftarol Davis, RN 12/09/2013 9:46 AM   Other:  Leighton ParodyBritney Tyson, RN 12/09/2013 9:46 AM   Other:     Other:    Other:    Other:    Other:    Other:      Scribe for Treatment Team:   Carmina MillerHorton, Cong Hightower Nicole, 12/09/2013 , 9:46 AM

## 2013-12-09 NOTE — [facility] Group Notes (Signed)
[facility] LCSW Group Therapy  12/09/2013   1:15 PM   Type of Therapy:  Group Therapy  Participation Level:  Active  Participation Quality:  Attentive, Sharing and Supportive  Affect:  Calm  Cognitive:  Alert and Oriented  Insight:  Developing/Improving and Engaged  Engagement in Therapy:  Developing/Improving and Engaged  Modes of Intervention:  Activity, Clarification, Confrontation, Discussion, Education, Exploration, Limit-setting, Orientation, Problem-solving, Rapport Building, Dance movement psychotherapisteality Testing, Socialization and Support  Summary of Progress/Problems: Patient was attentive and engaged with speaker from [employer].  Patient was attentive to speaker while they shared their story of dealing with mental health and overcoming it.  Patient expressed interest in their programs and services and received information on their agency.  Patient processed ways they can relate to the speaker.     Theresa IvanChelsea Horton, LCSW 12/09/2013  1:42 PM

## 2013-12-09 NOTE — Progress Notes (Signed)
Patient ID: Theresa Shaw, female   DOB: July 22, 1990, 23 y.o.   MRN: [id number] D: Patient in room on approach. Pt mood and affect is pleasant. Pt reports discharge tomorrow and states plans to continue sobriety. Pt attended evening wrap up group and interacted with peers. Pt denies SI/HI/AVH and pain. Pt able to do most ADL's.  Encourage pt to ask for help if unable to. No acute distressed noted at this time.   A: Met with pt 1:1. Medications administered as prescribed. Writer encouraged pt to discuss feelings. Pt encouraged to come to staff with any question or concerns.   R: Patient remains safe. She is complaint with medications and denies any adverse reaction. Continue current POC.
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
## 2013-12-09 NOTE — Progress Notes (Signed)
Patient ID: [name], female   DOB: 02/03/1991, 23 y.o.   MRN: [id number] Patient is discharged ambulatory to ride home with her fiance.  She denies SI/HI.  She verbalizes understanding of her discharge meds and follow up.  She was given scripts and a supply by MD.  She is happy to be leaving.

## 2013-12-09 NOTE — Progress Notes (Signed)
The focus of this group is to educate the patient on the purpose and policies of crisis stabilization and provide a format to answer questions about their admission.  The group details unit policies and expectations of patients while admitted.  Patient attended 0900 nurse education orientation group this morning.  Patient actively participated, appropriate affect, alert, appropriaite insight and engagement.  Today patient will work on 3 goals for discharge.

## 2013-12-09 NOTE — Progress Notes (Signed)
[facility] :  Will you be returning to the same living situation after discharge: Yes,  returning home, fiance is supportive At discharge, do you have transportation home?:Yes,  fiance will pick pt up today Do you have the ability to pay for your medications:Yes,  provided pt with samples and prescriptions and pt verbalized ability to afford meds  Release of information consent forms completed and in the chart;  Patient's signature needed at discharge.  Patient to Follow up at: Follow-up Information   [facility] 12[facility]15/2013. (Appointment scheduled at 9:00 am on this date for hospital discharge appointment.  They will than schedule you for medication management and therapy.  Referral # [id number])    Contact information:   [address] [place], [place] [address] Phone: [phone] Fax: [phone]      Patient denies SI/HI:   Yes,  denies SI/HI    Safety Planning and Suicide Prevention discussed:  Yes,  discussed with pt and pt's fiance.  See suicide prevention education note.   Theresa MillerHorton, Theresa Shaw 12/09/2013, 9:55 AM

## 2013-12-10 LAB — URINE CULTURE: Colony Count: >=100000

## 2013-12-12 NOTE — Progress Notes (Signed)
Patient Discharge Instructions:  After Visit Summary (AVS):   Faxed to:  12/12/13 Discharge Summary Note:   Faxed to:  12/12/13 Psychiatric Admission Assessment Note:   Faxed to:  12/12/13 Suicide Risk Assessment - Discharge Assessment:   Faxed to:  12/12/13 Faxed/Sent to the Next Level Care provider:  12/12/13 Faxed to [facility] @ [phone]  Jerelene ReddenSheena E [place], 12/12/2013, 3:04 PM

## 2013-12-16 ENCOUNTER — Ambulatory Visit (HOSPITAL_COMMUNITY)
Admission: RE | Admit: 2013-12-16 | Discharge: 2013-12-16 | Disposition: A | Discharge status: Home / Self Care | Payer: Medicaid Other | Source: Ambulatory Visit | Attending: Family Medicine | Admitting: Family Medicine

## 2013-12-16 DIAGNOSIS — Z86718 Personal history of other venous thrombosis and embolism: Secondary | ICD-10-CM | POA: Diagnosis not present

## 2013-12-16 DIAGNOSIS — F411 Generalized anxiety disorder: Secondary | ICD-10-CM | POA: Diagnosis not present

## 2013-12-16 DIAGNOSIS — IMO0001 Reserved for inherently not codable concepts without codable children: Secondary | ICD-10-CM | POA: Diagnosis present

## 2013-12-16 DIAGNOSIS — G8220 Paraplegia, unspecified: Secondary | ICD-10-CM | POA: Diagnosis not present
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
## 2014-01-28 ENCOUNTER — Ambulatory Visit: Payer: Medicaid Other | Admitting: Family Medicine

## 2014-01-30 ENCOUNTER — Ambulatory Visit: Payer: Medicaid Other | Admitting: Family Medicine

## 2014-02-04 ENCOUNTER — Encounter: Payer: Self-pay | Admitting: Family Medicine

## 2014-02-04 ENCOUNTER — Ambulatory Visit (INDEPENDENT_AMBULATORY_CARE_PROVIDER_SITE_OTHER): Payer: Medicaid Other | Admitting: Family Medicine

## 2014-02-04 VITALS — BP 102/80 | Ht 65.0 in | Wt 145.0 lb

## 2014-02-04 DIAGNOSIS — R062 Wheezing: Secondary | ICD-10-CM

## 2014-02-04 DIAGNOSIS — G8220 Paraplegia, unspecified: Secondary | ICD-10-CM

## 2014-02-04 MED ORDER — ALBUTEROL SULFATE HFA 108 (90 BASE) MCG/ACT IN AERS: 2.0000 | INHALATION_SPRAY | Freq: Four times a day (QID) | RESPIRATORY_TRACT | Status: DC | PRN

## 2014-02-04 NOTE — Progress Notes (Signed)
   Subjective:    Patient ID: Theresa Shaw, female    DOB: Apr 07, 1991, 23 y.o.   MRN: [id number]  HPI Patient is here today for a wheelchair assessment. The main purpose of this visit was to conduct a face-to-face office examination. This patient is not capable of walking with a walker, or a cane or crutches. She is fully wheelchair dependent. She is not capable of operating a standard wheelchair. She must have a ultra light weight wheelchair in order to be able to move room to room within her home and have access to wheelchair ramps. As well as having access to better mobility such as putting her wheelchair into vehicle when commuting.  I have reviewed over her physical therapy assessment and wholeheartedly agree with it.  A standard wheelchair is not feasible because she becomes too fatigued in her upper arms with repetitive use.  Patient states that she has been wheezing a lot. This has been present for several weeks now. Patient thinks she needs an inhaler.    Review of Systems See above denies fever    Objective:   Physical Exam This patient is a paraplegic. She has no muscle control or abilities in her legs. Patient does have moderate strength in the arms approximately 4/5.  Lungs are clear heart is regular abdomen soft.  Not respiratory distress currently     Assessment & Plan:  Wheelchair assessment-this patient needs ultra light weight wheelchair. Please see the above discussion. I have reviewed and totally agree with physical therapy  assessment. Orders were signed.  Intermittent reactive airway albuterol when necessary followup if problems may need further testing

## 2014-02-04 NOTE — Patient Instructions (Signed)
Smoking Cessation Quitting smoking is important to your health and has many advantages. However, it is not always easy to quit since nicotine is a very addictive drug. Oftentimes, people try 3 times or more before being able to quit. This document explains the best ways for you to prepare to quit smoking. Quitting takes hard work and a lot of effort, but you can do it. ADVANTAGES OF QUITTING SMOKING  You will live longer, feel better, and live better.  Your body will feel the impact of quitting smoking almost immediately.  Within 20 minutes, blood pressure decreases. Your pulse returns to its normal level.  After 8 hours, carbon monoxide levels in the blood return to normal. Your oxygen level increases.  After 24 hours, the chance of having a heart attack starts to decrease. Your breath, hair, and body stop smelling like smoke.  After 48 hours, damaged nerve endings begin to recover. Your sense of taste and smell improve.  After 72 hours, the body is virtually free of nicotine. Your bronchial tubes relax and breathing becomes easier.  After 2 to 12 weeks, lungs can hold more air. Exercise becomes easier and circulation improves.  The risk of having a heart attack, stroke, cancer, or lung disease is greatly reduced.  After 1 year, the risk of coronary heart disease is cut in half.  After 5 years, the risk of stroke falls to the same as a nonsmoker.  After 10 years, the risk of lung cancer is cut in half and the risk of other cancers decreases significantly.  After 15 years, the risk of coronary heart disease drops, usually to the level of a nonsmoker.  If you are pregnant, quitting smoking will improve your chances of having a healthy baby.  The people you live with, especially any children, will be healthier.  You will have extra money to spend on things other than cigarettes. QUESTIONS TO THINK ABOUT BEFORE ATTEMPTING TO QUIT You may want to talk about your answers with your  health care provider.  Why do you want to quit?  If you tried to quit in the past, what helped and what did not?  What will be the most difficult situations for you after you quit? How will you plan to handle them?  Who can help you through the tough times? Your family? Friends? A health care provider?  What pleasures do you get from smoking? What ways can you still get pleasure if you quit? Here are some questions to ask your health care provider:  How can you help me to be successful at quitting?  What medicine do you think would be best for me and how should I take it?  What should I do if I need more help?  What is smoking withdrawal like? How can I get information on withdrawal? GET READY  Set a quit date.  Change your environment by getting rid of all cigarettes, ashtrays, matches, and lighters in your home, car, or work. Do not let people smoke in your home.  Review your past attempts to quit. Think about what worked and what did not. GET SUPPORT AND ENCOURAGEMENT You have a better chance of being successful if you have help. You can get support in many ways.  Tell your family, friends, and coworkers that you are going to quit and need their support. Ask them not to smoke around you.  Get individual, group, or telephone counseling and support. Programs are available at local hospitals and health centers. Call   your local health department for information about programs in your area.  Spiritual beliefs and practices may help some smokers quit.  Download a "quit meter" on your computer to keep track of quit statistics, such as how long you have gone without smoking, cigarettes not smoked, and money saved.  Get a self-help book about quitting smoking and staying off tobacco. LEARN NEW SKILLS AND BEHAVIORS  Distract yourself from urges to smoke. Talk to someone, go for a walk, or occupy your time with a task.  Change your normal routine. Take a different route to work.  Drink tea instead of coffee. Eat breakfast in a different place.  Reduce your stress. Take a hot bath, exercise, or read a book.  Plan something enjoyable to do every day. Reward yourself for not smoking.  Explore interactive web-based programs that specialize in helping you quit. GET MEDICINE AND USE IT CORRECTLY Medicines can help you stop smoking and decrease the urge to smoke. Combining medicine with the above behavioral methods and support can greatly increase your chances of successfully quitting smoking.  Nicotine replacement therapy helps deliver nicotine to your body without the negative effects and risks of smoking. Nicotine replacement therapy includes nicotine gum, lozenges, inhalers, nasal sprays, and skin patches. Some may be available over-the-counter and others require a prescription.  Antidepressant medicine helps people abstain from smoking, but how this works is unknown. This medicine is available by prescription.  Nicotinic receptor partial agonist medicine simulates the effect of nicotine in your brain. This medicine is available by prescription. Ask your health care provider for advice about which medicines to use and how to use them based on your health history. Your health care provider will tell you what side effects to look out for if you choose to be on a medicine or therapy. Carefully read the information on the package. Do not use any other product containing nicotine while using a nicotine replacement product.  RELAPSE OR DIFFICULT SITUATIONS Most relapses occur within the first 3 months after quitting. Do not be discouraged if you start smoking again. Remember, most people try several times before finally quitting. You may have symptoms of withdrawal because your body is used to nicotine. You may crave cigarettes, be irritable, feel very hungry, cough often, get headaches, or have difficulty concentrating. The withdrawal symptoms are only temporary. They are strongest  when you first quit, but they will go away within 10-14 days. To reduce the chances of relapse, try to:  Avoid drinking alcohol. Drinking lowers your chances of successfully quitting.  Reduce the amount of caffeine you consume. Once you quit smoking, the amount of caffeine in your body increases and can give you symptoms, such as a rapid heartbeat, sweating, and anxiety.  Avoid smokers because they can make you want to smoke.  Do not let weight gain distract you. Many smokers will gain weight when they quit, usually less than 10 pounds. Eat a healthy diet and stay active. You can always lose the weight gained after you quit.  Find ways to improve your mood other than smoking. FOR MORE INFORMATION  www.smokefree.gov  Document Released: 05/09/2001 Document Revised: 09/29/2013 Document Reviewed: 08/24/2011 ExitCare Patient Information 2015 [employer]. This information is not intended to replace advice given to you by your health care provider. Make sure you discuss any questions you have with your health care provider.  

## 2014-02-26 ENCOUNTER — Emergency Department (HOSPITAL_COMMUNITY)
Admission: EM | Admit: 2014-02-26 | Discharge: 2014-02-27 | Disposition: A | Discharge status: Other Acute Inpatient Hospital | Payer: MEDICAID | Attending: Emergency Medicine | Admitting: Emergency Medicine

## 2014-02-26 ENCOUNTER — Encounter (HOSPITAL_COMMUNITY): Payer: Self-pay | Admitting: Emergency Medicine

## 2014-02-26 DIAGNOSIS — F32A Depression, unspecified: Secondary | ICD-10-CM

## 2014-02-26 DIAGNOSIS — F3130 Bipolar disorder, current episode depressed, mild or moderate severity, unspecified: Secondary | ICD-10-CM | POA: Diagnosis not present

## 2014-02-26 DIAGNOSIS — Z87828 Personal history of other (healed) physical injury and trauma: Secondary | ICD-10-CM | POA: Insufficient documentation

## 2014-02-26 DIAGNOSIS — F1310 Sedative, hypnotic or anxiolytic abuse, uncomplicated: Secondary | ICD-10-CM | POA: Diagnosis not present

## 2014-02-26 DIAGNOSIS — F1210 Cannabis abuse, uncomplicated: Secondary | ICD-10-CM | POA: Diagnosis not present

## 2014-02-26 DIAGNOSIS — G8220 Paraplegia, unspecified: Secondary | ICD-10-CM | POA: Insufficient documentation

## 2014-02-26 DIAGNOSIS — F329 Major depressive disorder, single episode, unspecified: Secondary | ICD-10-CM

## 2014-02-26 DIAGNOSIS — Z86718 Personal history of other venous thrombosis and embolism: Secondary | ICD-10-CM | POA: Diagnosis not present

## 2014-02-26 DIAGNOSIS — Z79899 Other long term (current) drug therapy: Secondary | ICD-10-CM | POA: Diagnosis not present

## 2014-02-26 DIAGNOSIS — Z8739 Personal history of other diseases of the musculoskeletal system and connective tissue: Secondary | ICD-10-CM | POA: Diagnosis not present

## 2014-02-26 DIAGNOSIS — Z3202 Encounter for pregnancy test, result negative: Secondary | ICD-10-CM | POA: Insufficient documentation

## 2014-02-26 DIAGNOSIS — R45851 Suicidal ideations: Secondary | ICD-10-CM

## 2014-02-26 DIAGNOSIS — Z720 Tobacco use: Secondary | ICD-10-CM | POA: Insufficient documentation

## 2014-02-26 LAB — CBC WITH DIFFERENTIAL/PLATELET
BASOS ABS: 0 10*3/uL (ref 0.0–0.1)
Basophils Relative: 0 % (ref 0–1)
EOS PCT: 1 % (ref 0–5)
Eosinophils Absolute: 0.1 10*3/uL (ref 0.0–0.7)
HEMATOCRIT: 43.4 % (ref 36.0–46.0)
HEMOGLOBIN: 15 g/dL (ref 12.0–15.0)
Lymphocytes Relative: 13 % (ref 12–46)
Lymphs Abs: 1.4 10*3/uL (ref 0.7–4.0)
MCH: 29.9 pg (ref 26.0–34.0)
MCHC: 34.6 g/dL (ref 30.0–36.0)
MCV: 86.5 fL (ref 78.0–100.0)
MONO ABS: 0.4 10*3/uL (ref 0.1–1.0)
MONOS PCT: 4 % (ref 3–12)
NEUTROS ABS: 9 10*3/uL — AB (ref 1.7–7.7)
Neutrophils Relative %: 82 % — ABNORMAL HIGH (ref 43–77)
Platelets: 258 10*3/uL (ref 150–400)
RBC: 5.02 MIL/uL (ref 3.87–5.11)
RDW: 11.9 % (ref 11.5–15.5)
WBC: 11 10*3/uL — ABNORMAL HIGH (ref 4.0–10.5)

## 2014-02-26 LAB — COMPREHENSIVE METABOLIC PANEL
ALT: 12 U/L (ref 0–35)
ANION GAP: 12 (ref 5–15)
AST: 13 U/L (ref 0–37)
Albumin: 4 g/dL (ref 3.5–5.2)
Alkaline Phosphatase: 71 U/L (ref 39–117)
BILIRUBIN TOTAL: 0.5 mg/dL (ref 0.3–1.2)
BUN: 6 mg/dL (ref 6–23)
CALCIUM: 9.5 mg/dL (ref 8.4–10.5)
CHLORIDE: 106 meq/L (ref 96–112)
CO2: 26 mEq/L (ref 19–32)
CREATININE: 0.65 mg/dL (ref 0.50–1.10)
GFR calc Af Amer: >90 mL/min (ref >90)
Glucose, Bld: 95 mg/dL (ref 70–99)
Potassium: 4.5 mEq/L (ref 3.7–5.3)
Sodium: 144 mEq/L (ref 137–147)
Total Protein: 7.5 g/dL (ref 6.0–8.3)

## 2014-02-26 LAB — ETHANOL: Alcohol, Ethyl (B): <11 mg/dL (ref 0–11)

## 2014-02-26 MED ORDER — BUPRENORPHINE HCL-NALOXONE HCL 4-1 MG SL FILM: 4.0000 mg | ORAL_FILM | Freq: Three times a day (TID) | SUBLINGUAL | Status: DC

## 2014-02-26 MED ORDER — ACETAMINOPHEN 325 MG PO TABS: 650.0000 mg | ORAL_TABLET | ORAL | Status: DC | PRN

## 2014-02-26 MED ORDER — ALUM & MAG HYDROXIDE-SIMETH 200-200-20 MG/5ML PO SUSP: 30.0000 mL | ORAL | Status: DC | PRN

## 2014-02-26 MED ORDER — ZOLPIDEM TARTRATE 5 MG PO TABS: 10.0000 mg | ORAL_TABLET | Freq: Every evening | ORAL | Status: DC | PRN

## 2014-02-26 MED ORDER — LORAZEPAM 1 MG PO TABS: 1.0000 mg | ORAL_TABLET | Freq: Three times a day (TID) | ORAL | Status: DC | PRN

## 2014-02-26 MED ORDER — NICOTINE 21 MG/24HR TD PT24
21.0000 mg | MEDICATED_PATCH | Freq: Every day | TRANSDERMAL | Status: DC
  Administered 2014-02-26: 21 mg via TRANSDERMAL
  Filled 2014-02-26: qty 1

## 2014-02-26 MED ORDER — ALBUTEROL SULFATE HFA 108 (90 BASE) MCG/ACT IN AERS: 2.0000 | INHALATION_SPRAY | Freq: Four times a day (QID) | RESPIRATORY_TRACT | Status: DC | PRN

## 2014-02-26 MED ORDER — ONDANSETRON HCL 4 MG PO TABS: 4.0000 mg | ORAL_TABLET | Freq: Three times a day (TID) | ORAL | Status: DC | PRN
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
## 2014-02-26 NOTE — ED Notes (Addendum)
If pt. Is transferred to another facility [facility] [facility] [place] [phone] to remove ankle monitor.

## 2014-02-26 NOTE — ED Notes (Signed)
Dr.Knapp at bedside  

## 2014-02-26 NOTE — ED Notes (Signed)
[facility] [place] [facility] [employer] [place]. Per IVC papers patient is depressed, suicidal, and abusing drugs. At this time of triage patient states she is depressed since boyfriend died in August. Patient denies SI. Patient is A&O, calm and cooperative.

## 2014-02-26 NOTE — ED Notes (Signed)
IVC Petitioner and father of patient Theresa LynnCharles Robert [phone]

## 2014-02-27 ENCOUNTER — Inpatient Hospital Stay (HOSPITAL_COMMUNITY)
Admission: AD | Admit: 2014-02-27 | Discharge: 2014-03-04 | DRG: 885 | Disposition: A | Discharge status: Home / Self Care | Payer: MEDICAID | Attending: Psychiatry | Admitting: Psychiatry

## 2014-02-27 ENCOUNTER — Encounter (HOSPITAL_COMMUNITY): Payer: Self-pay

## 2014-02-27 DIAGNOSIS — Z833 Family history of diabetes mellitus: Secondary | ICD-10-CM

## 2014-02-27 DIAGNOSIS — F4310 Post-traumatic stress disorder, unspecified: Secondary | ICD-10-CM | POA: Diagnosis present

## 2014-02-27 DIAGNOSIS — F419 Anxiety disorder, unspecified: Secondary | ICD-10-CM | POA: Diagnosis present

## 2014-02-27 DIAGNOSIS — Z8249 Family history of ischemic heart disease and other diseases of the circulatory system: Secondary | ICD-10-CM | POA: Diagnosis not present

## 2014-02-27 DIAGNOSIS — Z86718 Personal history of other venous thrombosis and embolism: Secondary | ICD-10-CM | POA: Diagnosis not present

## 2014-02-27 DIAGNOSIS — G4700 Insomnia, unspecified: Secondary | ICD-10-CM | POA: Diagnosis present

## 2014-02-27 DIAGNOSIS — M419 Scoliosis, unspecified: Secondary | ICD-10-CM | POA: Diagnosis present

## 2014-02-27 DIAGNOSIS — K5900 Constipation, unspecified: Secondary | ICD-10-CM | POA: Diagnosis present

## 2014-02-27 DIAGNOSIS — F319 Bipolar disorder, unspecified: Principal | ICD-10-CM | POA: Diagnosis present

## 2014-02-27 DIAGNOSIS — Z634 Disappearance and death of family member: Secondary | ICD-10-CM | POA: Diagnosis not present

## 2014-02-27 DIAGNOSIS — F1290 Cannabis use, unspecified, uncomplicated: Secondary | ICD-10-CM | POA: Diagnosis present

## 2014-02-27 DIAGNOSIS — F331 Major depressive disorder, recurrent, moderate: Secondary | ICD-10-CM

## 2014-02-27 DIAGNOSIS — F329 Major depressive disorder, single episode, unspecified: Secondary | ICD-10-CM | POA: Diagnosis present

## 2014-02-27 DIAGNOSIS — F1120 Opioid dependence, uncomplicated: Secondary | ICD-10-CM | POA: Diagnosis present

## 2014-02-27 DIAGNOSIS — F3130 Bipolar disorder, current episode depressed, mild or moderate severity, unspecified: Secondary | ICD-10-CM | POA: Diagnosis not present

## 2014-02-27 DIAGNOSIS — F1320 Sedative, hypnotic or anxiolytic dependence, uncomplicated: Secondary | ICD-10-CM | POA: Diagnosis present

## 2014-02-27 DIAGNOSIS — F17210 Nicotine dependence, cigarettes, uncomplicated: Secondary | ICD-10-CM | POA: Diagnosis present

## 2014-02-27 LAB — RAPID URINE DRUG SCREEN, HOSP PERFORMED
Amphetamines: NOT DETECTED
Barbiturates: NOT DETECTED
Benzodiazepines: POSITIVE — AB
COCAINE: NOT DETECTED
Opiates: NOT DETECTED
Tetrahydrocannabinol: POSITIVE — AB

## 2014-02-27 LAB — PREGNANCY, URINE: PREG TEST UR: NEGATIVE

## 2014-02-27 MED ORDER — INFLUENZA VAC SPLIT QUAD 0.5 ML IM SUSY
0.5000 mL | PREFILLED_SYRINGE | INTRAMUSCULAR | Status: DC
  Filled 2014-02-27: qty 0.5

## 2014-02-27 MED ORDER — ALBUTEROL SULFATE HFA 108 (90 BASE) MCG/ACT IN AERS: 2.0000 | INHALATION_SPRAY | Freq: Four times a day (QID) | RESPIRATORY_TRACT | Status: DC | PRN

## 2014-02-27 MED ORDER — TRAZODONE HCL 50 MG PO TABS: 50.0000 mg | ORAL_TABLET | Freq: Every evening | ORAL | Status: DC | PRN

## 2014-02-27 MED ORDER — ALUM & MAG HYDROXIDE-SIMETH 200-200-20 MG/5ML PO SUSP: 30.0000 mL | ORAL | Status: DC | PRN

## 2014-02-27 MED ORDER — PNEUMOCOCCAL VAC POLYVALENT 25 MCG/0.5ML IJ INJ: 0.5000 mL | INJECTION | INTRAMUSCULAR | Status: DC

## 2014-02-27 MED ORDER — ACETAMINOPHEN 325 MG PO TABS
650.0000 mg | ORAL_TABLET | Freq: Four times a day (QID) | ORAL | Status: DC | PRN
  Administered 2014-02-28 – 2014-03-03 (×2): 650 mg via ORAL
  Filled 2014-02-27 (×2): qty 2

## 2014-02-27 MED ORDER — BUPRENORPHINE HCL 2 MG SL SUBL
4.0000 mg | SUBLINGUAL_TABLET | Freq: Three times a day (TID) | SUBLINGUAL | Status: DC
  Administered 2014-02-27 (×2): 4 mg via SUBLINGUAL
  Filled 2014-02-27 (×2): qty 2

## 2014-02-27 MED ORDER — MAGNESIUM HYDROXIDE 400 MG/5ML PO SUSP
30.0000 mL | Freq: Every day | ORAL | Status: DC | PRN
Start: 2014-02-27 — End: 2014-03-04

## 2014-02-27 MED ORDER — DIPHENHYDRAMINE HCL 25 MG PO CAPS
50.0000 mg | ORAL_CAPSULE | Freq: Every evening | ORAL | Status: DC | PRN
  Administered 2014-02-27 – 2014-03-03 (×3): 50 mg via ORAL
  Filled 2014-02-27 (×2): qty 2
  Filled 2014-02-27: qty 6
  Filled 2014-02-27: qty 2

## 2014-02-27 MED ORDER — HYDROXYZINE HCL 25 MG PO TABS: 25.0000 mg | ORAL_TABLET | Freq: Four times a day (QID) | ORAL | Status: DC | PRN

## 2014-02-27 NOTE — ED Notes (Signed)
Called Sheriffs office, someone in route to pick up patient for transfer.

## 2014-02-27 NOTE — Tx Team (Signed)
Initial Interdisciplinary Treatment Plan   PATIENT STRESSORS: Financial difficulties Health problems Marital or family conflict Substance abuse Traumatic event   PROBLEM LIST: Problem List/Patient Goals Date to be addressed Date deferred Reason deferred Estimated date of resolution  Risk for suicide 02/27/14     depression 02/27/14     anxiety 02/27/14                                          DISCHARGE CRITERIA:  Adequate post-discharge living arrangements Improved stabilization in mood, thinking, and/or behavior Reduction of life-threatening or endangering symptoms to within safe limits Verbal commitment to aftercare and medication compliance  PRELIMINARY DISCHARGE PLAN: Attend aftercare/continuing care group Outpatient therapy  PATIENT/FAMIILY INVOLVEMENT: This treatment plan has been presented to and reviewed with the patient, Theresa Shaw.  The patient and family have been given the opportunity to ask questions and make suggestions.  Jacques Navyhillips, Cosandra Plouffe A 02/27/2014, 11:22 PM

## 2014-02-27 NOTE — BH Assessment (Signed)
Pt meets inpt criteria but [facility] Faxed to facilities for consideration for placement. [place] [facility]   Muadh Creasy, [place] Triage Specialist 02/27/2014 5:08 AM

## 2014-02-27 NOTE — ED Notes (Signed)
Sheriffs Department here to transport patient. Patient looked through her belongings and confirmed everything was there, belonging then given to officer.

## 2014-02-27 NOTE — ED Notes (Signed)
Called sheriffs office to inform about patients pending transfer, stated someone will be here.

## 2014-02-27 NOTE — ED Notes (Signed)
TTS at bedside. 

## 2014-02-27 NOTE — ED Provider Notes (Signed)
3PmPatient alert pleasant cooperative Glasgow Coma Score 15. Stable for transfer to psychiatric facility. Results for orders placed during the hospital encounter of 02/26/14  CBC WITH DIFFERENTIAL      Result Value Ref Range   WBC 11.0 (*) 4.0 - 10.5 K/uL   RBC 5.02  3.87 - 5.11 MIL/uL   Hemoglobin 15.0  12.0 - 15.0 g/dL   HCT 09.843.4  11.936.0 - 14.746.0 %   MCV 86.5  78.0 - 100.0 fL   MCH 29.9  26.0 - 34.0 pg   MCHC 34.6  30.0 - 36.0 g/dL   RDW 82.911.9  56.211.5 - 13.015.5 %   Platelets 258  150 - 400 K/uL   Neutrophils Relative % 82 (*) 43 - 77 %   Neutro Abs 9.0 (*) 1.7 - 7.7 K/uL   Lymphocytes Relative 13  12 - 46 %   Lymphs Abs 1.4  0.7 - 4.0 K/uL   Monocytes Relative 4  3 - 12 %   Monocytes Absolute 0.4  0.1 - 1.0 K/uL   Eosinophils Relative 1  0 - 5 %   Eosinophils Absolute 0.1  0.0 - 0.7 K/uL   Basophils Relative 0  0 - 1 %   Basophils Absolute 0.0  0.0 - 0.1 K/uL  COMPREHENSIVE METABOLIC PANEL      Result Value Ref Range   Sodium 144  137 - 147 mEq/L   Potassium 4.5  3.7 - 5.3 mEq/L   Chloride 106  96 - 112 mEq/L   CO2 26  19 - 32 mEq/L   Glucose, Bld 95  70 - 99 mg/dL   BUN 6  6 - 23 mg/dL   Creatinine, Ser 8.650.65  0.50 - 1.10 mg/dL   Calcium 9.5  8.4 - 78.410.5 mg/dL   Total Protein 7.5  6.0 - 8.3 g/dL   Albumin 4.0  3.5 - 5.2 g/dL   AST 13  0 - 37 U/L   ALT 12  0 - 35 U/L   Alkaline Phosphatase 71  39 - 117 U/L   Total Bilirubin 0.5  0.3 - 1.2 mg/dL   GFR calc non Af Amer >90  >90 mL/min   GFR calc Af Amer >90  >90 mL/min   Anion gap 12  5 - 15  URINE RAPID DRUG SCREEN (HOSP PERFORMED)      Result Value Ref Range   Opiates NONE DETECTED  NONE DETECTED   Cocaine NONE DETECTED  NONE DETECTED   Benzodiazepines POSITIVE (*) NONE DETECTED   Amphetamines NONE DETECTED  NONE DETECTED   Tetrahydrocannabinol POSITIVE (*) NONE DETECTED   Barbiturates NONE DETECTED  NONE DETECTED  ETHANOL      Result Value Ref Range   Alcohol, Ethyl (B) <11  0 - 11 mg/dL  PREGNANCY, URINE      Result  Value Ref Range   Preg Test, Ur NEGATIVE  NEGATIVE   No results found.   Doug SouSam Raiden Yearwood, MD 02/27/14 1513
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
## 2014-02-27 NOTE — ED Notes (Signed)
Called pharmacy about Subutex medication. Was told that they would bring the medication and put it in the pyxis.

## 2014-02-27 NOTE — ED Notes (Signed)
AC called for Subutex.

## 2014-02-27 NOTE — ED Notes (Signed)
Pt. Self-caths at home. Pt. Given catheter. Pt. Completed in and out cath independently.

## 2014-02-27 NOTE — BH Assessment (Signed)
Reviewed notes prior to assessment and requested a copy of IVC paperwork be faxed.   Requested equipment to be placed with pt for assessment.   Assessment to commence shortly.  Clista BernhardtNancy Aleksei Goodlin, [facility] Triage Specialist 02/27/2014 3:21 AM

## 2014-02-27 NOTE — BH Assessment (Signed)
Relayed results of assessment to J. Lord, NO. Per Molli KnockJ. Lord pt meets inpt criteria. No [facility] beds available. TTS to seek placement.   Relayed plan to RN who will discuss with EDP who pt was handed off to at shift change. She will get EDP to complete first opinion.   TTS to seek placement.   Clista BernhardtNancy Laneisha Mino, [facility] Triage Specialist 02/27/2014 4:05 AM

## 2014-02-27 NOTE — ED Notes (Signed)
Pt. Allowed to make phone call to Father.
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
## 2014-02-28 DIAGNOSIS — F3130 Bipolar disorder, current episode depressed, mild or moderate severity, unspecified: Secondary | ICD-10-CM

## 2014-02-28 MED ORDER — ONDANSETRON 4 MG PO TBDP
ORAL_TABLET | ORAL | Status: AC
  Filled 2014-02-28: qty 1

## 2014-02-28 MED ORDER — BUPRENORPHINE HCL 2 MG SL SUBL
4.0000 mg | SUBLINGUAL_TABLET | Freq: Once | SUBLINGUAL | Status: AC
  Administered 2014-02-28: 4 mg via SUBLINGUAL
  Filled 2014-02-28: qty 2

## 2014-02-28 MED ORDER — BUPRENORPHINE HCL 2 MG SL SUBL
4.0000 mg | SUBLINGUAL_TABLET | Freq: Two times a day (BID) | SUBLINGUAL | Status: DC
  Administered 2014-02-28 – 2014-03-02 (×4): 4 mg via SUBLINGUAL
  Filled 2014-02-28 (×4): qty 2

## 2014-02-28 MED ORDER — PRAZOSIN HCL 2 MG PO CAPS
2.0000 mg | ORAL_CAPSULE | Freq: Every day | ORAL | Status: DC
  Administered 2014-02-28 – 2014-03-01 (×2): 2 mg via ORAL
  Filled 2014-02-28 (×2): qty 1
  Filled 2014-02-28: qty 2
  Filled 2014-02-28: qty 1

## 2014-02-28 MED ORDER — CARBAMAZEPINE ER 200 MG PO TB12
200.0000 mg | ORAL_TABLET | Freq: Two times a day (BID) | ORAL | Status: DC
  Administered 2014-02-28 – 2014-03-04 (×10): 200 mg via ORAL
  Filled 2014-02-28: qty 1
  Filled 2014-02-28: qty 6
  Filled 2014-02-28: qty 1
  Filled 2014-02-28: qty 6
  Filled 2014-02-28 (×8): qty 1
  Filled 2014-02-28 (×2): qty 6
  Filled 2014-02-28 (×3): qty 1

## 2014-02-28 MED ORDER — ONDANSETRON HCL 4 MG PO TABS
4.0000 mg | ORAL_TABLET | Freq: Three times a day (TID) | ORAL | Status: DC | PRN
  Administered 2014-02-28: 4 mg via ORAL
  Filled 2014-02-28 (×2): qty 1

## 2014-02-28 MED ORDER — ARIPIPRAZOLE 5 MG PO TABS
5.0000 mg | ORAL_TABLET | Freq: Every day | ORAL | Status: DC
  Administered 2014-02-28 – 2014-03-04 (×5): 5 mg via ORAL
  Filled 2014-02-28 (×2): qty 1
  Filled 2014-02-28: qty 3
  Filled 2014-02-28 (×6): qty 1
  Filled 2014-02-28: qty 3

## 2014-02-28 MED ORDER — HYDROXYZINE HCL 25 MG PO TABS
25.0000 mg | ORAL_TABLET | ORAL | Status: DC | PRN
  Administered 2014-02-28: 25 mg via ORAL
  Filled 2014-02-28: qty 1

## 2014-02-28 NOTE — [facility] Suicide Risk Assessment (Signed)
Suicide Risk Assessment  Admission Assessment     Nursing information obtained from:    Demographic factors:    Current Mental Status:    Loss Factors:    Historical Factors:    Risk Reduction Factors:    Total Time spent with patient: 20 minutes  CLINICAL FACTORS:   Severe Anxiety and/or Agitation Bipolar Disorder:   Depressive phase Depression:   Aggression Anhedonia Hopelessness Impulsivity Insomnia Alcohol/Substance Abuse/Dependencies Chronic Pain Unstable or Poor Therapeutic Relationship  Psychiatric Specialty Exam:     Blood pressure 95/53, pulse 85, temperature 97.8 F (36.6 C), temperature source Oral, resp. rate 16, height 5\' 3"  (1.6 m), weight 61.236 kg (135 lb), last menstrual period 02/18/2014.Body mass index is 23.92 kg/(m^2).  General Appearance: Fairly Groomed  Patent attorneyye Contact::  Good  Speech:  Clear and Coherent  Volume:  Normal  Mood:  Dysphoric  Affect:  Appropriate  Thought Process:  Goal Directed  Orientation:  Full (Time, Place, and Person)  Thought Content:  Negative  Suicidal Thoughts:  Yes.  without intent/plan  Homicidal Thoughts:  No  Memory:  Immediate;   Fair Recent;   Fair Remote;   Fair  Judgement:  Impaired  Insight:  Shallow  Psychomotor Activity:  Normal  Concentration:  Fair  Recall:  [employer]Fair  Fund of Knowledge:Fair  Language: Good  Akathisia:  No  Handed:  Right  AIMS (if indicated):     Assets:  Communication Skills Desire for Improvement Physical Health  Sleep:  Number of Hours: 6.5   Musculoskeletal: Strength & Muscle Tone: within normal limits Gait & Station: normal Patient leans: N/A  COGNITIVE FEATURES THAT CONTRIBUTE TO RISK:  Closed-mindedness    SUICIDE RISK:   Mild:  Suicidal ideation of limited frequency, intensity, duration, and specificity.  There are no identifiable plans, no associated intent, mild dysphoria and related symptoms, good self-control (both objective and subjective assessment), few other risk  factors, and identifiable protective factors, including available and accessible social support.  PLAN OF CARE:1. Admit for crisis management and stabilization. 2. Medication management to reduce current symptoms to base line and improve the     patient's overall level of functioning 3. Treat health problems as indicated. 4. Develop treatment plan to decrease risk of relapse upon discharge and the need for     readmission. 5. Psycho-social education regarding relapse prevention and self care. 6. Health care follow up as needed for medical problems. 7. Restart home medications where appropriate.   I certify that inpatient services furnished can reasonably be expected to improve the patient's condition.  Thedore MinsAkintayo, Ewin Rehberg, MD 02/28/2014, 10:31 AM

## 2014-02-28 NOTE — Progress Notes (Signed)
[facility] Group Notes:  (Nursing/MHT/Case Management/Adjunct)  Date:  02/28/2014  Time:  11:10 PM  Type of Therapy:  AA  Participation Level:  Did Not Attend  Participation Quality:  Did Not Attend  Affect:  Did Not Attend  Cognitive:  Did Not Attend  Insight:  None  Engagement in Group:  Did Not Attend  Modes of Intervention:  Discussion and Education  Summary of Progress/Problems: Pt. Did not Attend group.  Sondra ComeWilson, Evan Mackie J 02/28/2014, 11:10 PM

## 2014-02-28 NOTE — [facility] Group Notes (Signed)
[facility] Group Notes:  (Nursing/MHT/Case Management/Adjunct)  Date:  02/28/2014  Time:  4:15 PM  Type of Therapy: Psychoeducational Skills- Healthy Coping Skills   Participation Level: Did Not Attend   Theresa Shaw 02/28/2014, 4:15 PM 

## 2014-02-28 NOTE — [facility] Group Notes (Signed)
[facility] Group Notes:  (Nursing/MHT/Case Management/Adjunct)  Date:  02/28/2014  Time:  11:40 AM  Type of Therapy:  Psychoeducational Skills- Patient Self Inventory  Participation Level:  Did Not Attend  Maram Bently Shanta 02/28/2014, 11:40 AM 

## 2014-02-28 NOTE — [facility] Group Notes (Signed)
[facility] Group Notes: (Clinical Social Work)   02/28/2014      Type of Therapy:  Group Therapy   Participation Level:  Did Not Attend - refused to get out of bed   Ambrose MantleMareida Grossman-Orr, LCSW 02/28/2014, 12:42 PM
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
## 2014-03-01 DIAGNOSIS — F329 Major depressive disorder, single episode, unspecified: Secondary | ICD-10-CM

## 2014-03-01 MED ORDER — BOOST / RESOURCE BREEZE PO LIQD
1.0000 | Freq: Two times a day (BID) | ORAL | Status: DC
  Administered 2014-03-02 – 2014-03-03 (×2): 1 via ORAL
  Filled 2014-03-01 (×10): qty 1

## 2014-03-01 NOTE — Progress Notes (Signed)
Patient did not attend the evening speaker AA meeting.  Pt was notified that group was beginning but remained in her bed.   

## 2014-03-01 NOTE — [facility] Group Notes (Signed)
[facility] Group Notes:  (Nursing/MHT/Case Management/Adjunct)  Date:  03/01/2014  Time:  12:22 PM  Type of Therapy:  Psychoeducational Skills-Pt Self Inventory Group   Participation Level:  Did Not Attend  Bernadetta Roell Shanta 03/01/2014, 12:22 PM 

## 2014-03-01 NOTE — Progress Notes (Signed)
Patient ID: Theresa GeeHannah B Shaw, female   DOB: 09/28/1990, 23 y.o.   MRN: [id number]   D: Pt continues to be very flat and depressed on the unit today. Pt has not attended any groups and has remained in the bed most of the day. Pt takes all medication without any problems, no issues or concerns noted.  Pt reported being negative SI/HI, no AH/VH noted. A: 15 min checks continued for patient safety. R: Pt safety maintained.

## 2014-03-01 NOTE — Progress Notes (Signed)
NUTRITION ASSESSMENT  Pt identified as at risk on the Malnutrition Screen Tool  INTERVENTION: 1. Educated patient on the importance of nutrition and encouraged intake of food and beverages. 2. Discussed weight goals. 3. Supplements: Resource Breeze BID  NUTRITION DIAGNOSIS: Unintentional weight loss related to sub-optimal intake as evidenced by pt report.   Goal: Pt to meet >/= 90% of their estimated nutrition needs.  Monitor:  PO intake  Assessment:  Patient is paraplegic, wheelchair-bound. She has had worsening depression since fiance died. She reports decreased appetite and early satiety. She is currently eating about 1/3 of her meals. She reports a 25 pound weight loss in 2 months.   23 y.o. female  Height: Ht Readings from Last 1 Encounters:  02/27/14 5\' 3"  (1.6 m)    Weight: Wt Readings from Last 1 Encounters:  02/27/14 135 lb (61.236 kg)    Weight Hx: Wt Readings from Last 10 Encounters:  02/27/14 135 lb (61.236 kg)  02/26/14 135 lb (61.236 kg)  02/04/14 145 lb (65.772 kg)  12/05/13 162 lb (73.483 kg)  12/05/13 160 lb (72.576 kg)  11/20/13 161 lb (73.029 kg)  05/06/13 135 lb (61.236 kg)  04/30/13 134 lb (60.782 kg)  01/14/13 150 lb (68.04 kg)  12/08/12 150 lb (68.04 kg)    BMI:  Body mass index is 23.92 kg/(m^2). Pt meets criteria for normal weight based on current BMI.  Estimated Nutritional Needs: Kcal: 25-30 kcal/kg Protein: > 1 gram protein/kg Fluid: 1 ml/kcal  Diet Order: General Pt is also offered choice of unit snacks mid-morning and mid-afternoon.  Pt is eating as desired.   Lab results and medications reviewed.   Linnell FullingElyse Layla Gramm, RD, LDN Pager #: [phone] After-Hours Pager #: [phone]
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
## 2014-03-01 NOTE — [facility] Group Notes (Signed)
[facility] Group Notes:  (Nursing/MHT/Case Management/Adjunct)  Date:  03/01/2014  Time:  6:24 PM  Type of Therapy:  Nurse Education  Participation Level:  Active  Participation Quality:  Appropriate, Sharing and Supportive  Affect:  Appropriate  Cognitive:  Alert and Appropriate  Insight:  Good  Engagement in Group:  Engaged and Supportive  Modes of Intervention:  Activity, Discussion and Education  Summary of Progress/Problems: Pt did identify her chaplin/pastor as a support for her and we discussed hospice for her grief counseling.   Jule SerKent, Dewayne Severe Gail 03/01/2014, 6:24 PM
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
## 2014-03-02 DIAGNOSIS — F1919 Other psychoactive substance abuse with unspecified psychoactive substance-induced disorder: Secondary | ICD-10-CM

## 2014-03-02 MED ORDER — BUPRENORPHINE HCL 2 MG SL SUBL: 4.0000 mg | SUBLINGUAL_TABLET | Freq: Two times a day (BID) | SUBLINGUAL | Status: DC

## 2014-03-02 MED ORDER — BUPRENORPHINE HCL 2 MG SL SUBL
4.0000 mg | SUBLINGUAL_TABLET | Freq: Two times a day (BID) | SUBLINGUAL | Status: DC
  Administered 2014-03-02 – 2014-03-04 (×4): 4 mg via SUBLINGUAL
  Filled 2014-03-02 (×6): qty 2

## 2014-03-02 MED ORDER — BUPRENORPHINE HCL 2 MG SL SUBL
4.0000 mg | SUBLINGUAL_TABLET | Freq: Every day | SUBLINGUAL | Status: DC
  Administered 2014-03-02 – 2014-03-03 (×2): 4 mg via SUBLINGUAL

## 2014-03-02 MED ORDER — DULOXETINE HCL 20 MG PO CPEP
20.0000 mg | ORAL_CAPSULE | Freq: Every day | ORAL | Status: DC
  Administered 2014-03-03: 20 mg via ORAL
  Filled 2014-03-02 (×4): qty 1

## 2014-03-02 NOTE — [facility] Group Notes (Signed)
[facility] LCSW Group Therapy 03/02/2014  1:15 PM   Type of Therapy: Group Therapy  Participation Level: Did Not Attend for unknown reason.    Edrian Melucci, MSW, LCSWA Clinical Social Worker  [facility] [phone]   
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
## 2014-03-02 NOTE — [facility] Group Notes (Signed)
   [facility]  03/02/2014  8:45 AM   Participation Quality: Alert, Appropriate and Oriented  Mood/Affect: Depressed and Flat  Depression Rating: 5  Anxiety Rating: 5  Thoughts of Suicide: Pt denies SI/HI  Will you contract for safety? Yes  Current AVH: Pt denies  Plan for Discharge/Comments: Pt attended discharge planning group and actively participated in group. CSW provided pt with today's workbook. Patient reports that she is feeling "a little better" today. She reports that she can return to her current living situation at discharge and [facility] She reports that she has an upcoming appointment on October 22nd.   Transportation Means: Pt reports access to transportation  Supports: No supports mentioned at this time  Theresa Shaw, MSW, Am[employer] IncLCSWA Clinical Social Worker [facility] [phone]

## 2014-03-02 NOTE — Progress Notes (Signed)
D: Pt denies SI/HI/AV. Pt is pleasant and cooperative. Pt stayed in room. Pt said she was a little better. Pt very flat and sad.   A: Pt was offered support and encouragement. Pt was given scheduled medications. Pt was encourage to attend groups. Q 15 minute checks were done for safety.   R: Pt is taking medication.Pt receptive to treatment and safety maintained on unit.

## 2014-03-02 NOTE — Progress Notes (Signed)
Patient ID: Theresa Shaw, female   DOB: 06/10/1990, 23 y.o.   MRN: [id number] Patient reports fair sleep last night She reports her appetite is fair and her energy level is low.  Her concentration is poor.  She is rating depression, hopelessness and anxiety at 5/10.  She has been feeling weak and dizzy today.  She is presently resting in bed.   A- supported patient.  Rechecked her BP and Pulse.  Encouraged her to continue to drink fluids. R- patient was able to eat "more than i usually do" for lunch and she continues to feel dizzy.  Reviewed fall precautions with patient She is independent in her transferring usually and encouraged her to ask for help if needed.   .Marland Kitchen

## 2014-03-02 NOTE — [facility] Suicide Risk Assessment (Signed)
[facility] INPATIENT:  Family/Significant Other Suicide Prevention Education  Suicide Prevention Education:  Education Completed; Amy Loralyn FreshwaterKrites (pt's mother) [phone] has been identified by the patient as the family member/significant other with whom the patient will be residing, and identified as the person(s) who will aid the patient in the event of a mental health crisis (suicidal ideations/suicide attempt).  With written consent from the patient, the family member/significant other has been provided the following suicide prevention education, prior to the and/or following the discharge of the patient.  The suicide prevention education provided includes the following:  Suicide risk factors  Suicide prevention and interventions  National Suicide Hotline telephone number  [facility] assessment telephone number  [facility] Emergency Assistance 911  [facility] and/or Residential Mobile Crisis Unit telephone number  Request made of family/significant other to:  Remove weapons (e.g., guns, rifles, knives), all items previously/currently identified as safety concern.    Remove drugs/medications (over-the-counter, prescriptions, illicit drugs), all items previously/currently identified as a safety concern.  The family member/significant other verbalizes understanding of the suicide prevention education information provided.  The family member/significant other agrees to remove the items of safety concern listed above.  Smart, Magdalena Skilton LCSWA 03/02/2014, 4:01 PM
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
## 2014-03-02 NOTE — Progress Notes (Signed)
Adult Psychoeducational Group Note  Date:  03/02/2014 Time:  10:00am Group Topic/Focus:  Therapeutic Activity  Participation Level:  Active  Participation Quality:  Appropriate and Attentive  Affect:  Appropriate  Cognitive:  Alert and Appropriate  Insight: Appropriate and Good  Engagement in Group:  Engaged  Modes of Intervention:  Discussion and Education  Additional Comments:  Pt attended group and discussion was on name one positive thing about yourself. Pt stated she is a good listener.   Shelly BombardGarner, Surie Suchocki D 03/02/2014, 11:09 AM

## 2014-03-03 DIAGNOSIS — Z634 Disappearance and death of family member: Secondary | ICD-10-CM

## 2014-03-03 MED ORDER — DULOXETINE HCL 30 MG PO CPEP
30.0000 mg | ORAL_CAPSULE | Freq: Every day | ORAL | Status: DC
  Administered 2014-03-04: 30 mg via ORAL
  Filled 2014-03-03: qty 3
  Filled 2014-03-03: qty 1
  Filled 2014-03-03: qty 3
  Filled 2014-03-03: qty 1

## 2014-03-03 MED ORDER — MINERAL OIL RE ENEM
1.0000 | ENEMA | Freq: Once | RECTAL | Status: DC
  Filled 2014-03-03 (×2): qty 1

## 2014-03-03 MED ORDER — ASPIRIN-ACETAMINOPHEN-CAFFEINE 250-250-65 MG PO TABS
2.0000 | ORAL_TABLET | Freq: Four times a day (QID) | ORAL | Status: DC | PRN
  Filled 2014-03-03: qty 2
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
## 2014-03-03 NOTE — Progress Notes (Signed)
Patient ID: Theresa Shaw, female   DOB: 12/05/1990, 23 y.o.   MRN: [id number] She has been in the bed most of the day, up for  Meals and medication. Did go to ist pm group then back to bed. She is c/o headache and stomach pain. Stated that she need a fleets enema but did not want it till in the morning. She has been given a prn for a headache that was helpful at 13:06 down to 4. Self inventory this AM: depression 2, hopelessness 2 anxiety 3, withdrawals of cramping, pain back pain no SI thoughts. Goal  Is to just talk.

## 2014-03-03 NOTE — Progress Notes (Signed)
[facility] MD Progress Note  03/03/2014 5:34 PM Theresa Shaw  MRN:  [id number] Subjective:  She did better off the [employer]ipress. Got the first dose of Cymbalta this AM States she is having a hard time with a headache (migraine) she is still denying suicidal ideations. She understands that her parents do not trust her due to mostly her relationship with her dead fiancee. She is still dealing with the loss but state she wants to move on to be there for her son. She is planning to get a stable place to live in the next couple of weeks. Until then her son is going to stay with his father. She is also having problems with constipation Diagnosis:   DSM5: Trauma-Stressor Disorders:  Posttraumatic Stress Disorder (309.81) Depressive Disorders:  Major Depressive Disorder - Moderate (296.22) Total Time spent with patient: 30 minutes  Axis I: Bereavement  ADL's:  Intact  Sleep: Fair  Appetite:  Fair  Psychiatric Specialty Exam: Physical Exam  Review of Systems  Constitutional: Positive for malaise/fatigue.  Eyes: Negative.   Respiratory: Negative.   Cardiovascular: Negative.   Gastrointestinal: Positive for constipation.  Genitourinary: Negative.   Musculoskeletal: Negative.   Skin: Negative.   Neurological: Positive for weakness and headaches.  Endo/Heme/Allergies: Negative.   Psychiatric/Behavioral: Positive for depression. The patient is nervous/anxious.     Blood pressure 105/48, pulse 104, temperature 98.6 F (37 C), temperature source Oral, resp. rate 16, height 5\' 3"  (1.6 m), weight 61.236 kg (135 lb), last menstrual period 02/18/2014.Body mass index is 23.92 kg/(m^2).  General Appearance: Fairly Groomed  Patent attorney::  Fair  Speech:  Clear and Coherent  Volume:  Normal  Mood:  Anxious, Depressed and in pain  Affect:  anxious worried in pain  Thought Process:  Coherent and Goal Directed  Orientation:  Full (Time, Place, and Person)  Thought Content:  symptoms worries concerns   Suicidal Thoughts:  No  Homicidal Thoughts:  No  Memory:  Immediate;   Fair Recent;   Fair Remote;   Fair  Judgement:  Fair  Insight:  Present  Psychomotor Activity:  Restlessness  Concentration:  Fair  Recall:  [employer] of Knowledge:NA  Language: Fair  Akathisia:  No  Handed:    AIMS (if indicated):     Assets:  Desire for Improvement  Sleep:  Number of Hours: 5.75   Musculoskeletal: Strength & Muscle Tone: decreased Gait & Station: unable to stand Patient leans: N/A  Current Medications: Current Facility-Administered Medications  Medication Dose Route Frequency Provider Last Rate Last Dose  . acetaminophen (TYLENOL) tablet 650 mg  650 mg Oral Q6H PRN Audrea Muscat, NP   650 mg at 03/03/14 1217  . albuterol (PROVENTIL HFA;VENTOLIN HFA) 108 (90 BASE) MCG/ACT inhaler 2 puff  2 puff Inhalation Q6H PRN Evanna Janann August, NP      . alum & mag hydroxide-simeth (MAALOX/MYLANTA) 200-200-20 MG/5ML suspension 30 mL  30 mL Oral Q4H PRN Evanna Janann August, NP      . ARIPiprazole (ABILIFY) tablet 5 mg  5 mg Oral Daily Mojeed Akintayo   5 mg at 03/03/14 0859  . aspirin-acetaminophen-caffeine (EXCEDRIN MIGRAINE) per tablet 2 tablet  2 tablet Oral Q6H PRN Rachael Fee, MD      . buprenorphine (SUBUTEX) SL tablet 4 mg  4 mg Sublingual QHS Rachael Fee, MD   4 mg at 03/02/14 2205  . buprenorphine (SUBUTEX) SL tablet 4 mg  4 mg Sublingual BID Rachael Fee, MD  4 mg at 03/03/14 1258  . carbamazepine (TEGRETOL XR) 12 hr tablet 200 mg  200 mg Oral BID Mojeed Akintayo   200 mg at 03/03/14 1722  . diphenhydrAMINE (BENADRYL) capsule 50 mg  50 mg Oral QHS PRN Audrea MuscatEvanna Cori Burkett, NP   50 mg at 03/02/14 2205  . DULoxetine (CYMBALTA) DR capsule 20 mg  20 mg Oral Daily Rachael FeeIrving A Riley Hallum, MD   20 mg at 03/03/14 0859  . feeding supplement (RESOURCE BREEZE) (RESOURCE BREEZE) liquid 1 Container  1 Container Oral BID BM Elyse A Shearer, RD   1 Container at 03/03/14 1440  . hydrOXYzine  (ATARAX/VISTARIL) tablet 25 mg  25 mg Oral Q4H PRN Mojeed Akintayo   25 mg at 02/28/14 2130  . Influenza vac split quadrivalent PF (FLUARIX) injection 0.5 mL  0.5 mL Intramuscular Tomorrow-1000 Nehemiah MassedFernando Cobos, MD      . magnesium hydroxide (MILK OF MAGNESIA) suspension 30 mL  30 mL Oral Daily PRN Evanna Janann Augustori Burkett, NP      . Melene Muller[START ON 03/04/2014] mineral oil enema 1 enema  1 enema Rectal Once Rachael FeeIrving A Shayley Medlin, MD      . ondansetron [facility](ZOFRAN) tablet 4 mg  4 mg Oral Q8H PRN Sanjuana KavaAgnes I Nwoko, NP   4 mg at 02/28/14 1324  . pneumococcal 23 valent vaccine (PNU-IMMUNE) injection 0.5 mL  0.5 mL Intramuscular Tomorrow-1000 Nehemiah MassedFernando Cobos, MD        Lab Results: No results found for this or any previous visit (from the past 48 hour(s)).  Physical Findings: AIMS: Facial and Oral Movements Muscles of Facial Expression: None, normal Lips and Perioral Area: None, normal Jaw: None, normal Tongue: None, normal,Extremity Movements Upper (arms, wrists, hands, fingers): None, normal Lower (legs, knees, ankles, toes): None, normal, Trunk Movements Neck, shoulders, hips: None, normal, Overall Severity Severity of abnormal movements (highest score from questions above): None, normal Incapacitation due to abnormal movements: None, normal Patient's awareness of abnormal movements (rate only patient's report): No Awareness, Dental Status Current problems with teeth and/or dentures?: No Does patient usually wear dentures?: No  CIWA:  CIWA-Ar Total: 0 COWS:  COWS Total Score: 1  Treatment Plan Summary: Daily contact with patient to assess and evaluate symptoms and progress in treatment Medication management  Plan: Supportive approach/coping skills           CBT mindfulness           Excedrin migraine, Fleet enema in AM           Increase the Cymbalta to 30 mg in AM Medical Decision Making Problem Points:  Review of psycho-social stressors (1) Data Points:  Review of medication regiment & side effects (2)  I  certify that inpatient services furnished can reasonably be expected to improve the patient's condition.   Grady Mohabir A 03/03/2014, 5:34 PM

## 2014-03-03 NOTE — Progress Notes (Signed)
D: Pt denies SI/HI/AVH. Pt is pleasant and cooperative. Pt stated she was feeling a little better. Pt tearful, said BF family mad at her because he died.   A: Pt was offered support and encouragement. Pt was given scheduled medications. Pt was encourage to attend groups. Q 15 minute checks were done for safety.   R:Pt attends groups and interacts well with peers and staff. Pt is taking medication. Pt has no complaints at this time.Pt receptive to treatment and safety maintained on unit.

## 2014-03-03 NOTE — Progress Notes (Signed)
The focus of this group is to educate the patient on the purpose and policies of crisis stabilization and provide a format to answer questions about their admission.  The group details unit policies and expectations of patients while admitted. Patient attended this group. 

## 2014-03-03 NOTE — BHH Group Notes (Deleted)
The focus of this group is to educate the patient on the purpose and policies of crisis stabilization and provide a format to answer questions about their admission.  The group details unit policies and expectations of patients while admitted. Patient did not attend this group. 

## 2014-03-03 NOTE — Plan of Care (Signed)
Problem: Ineffective individual coping Goal: LTG: Patient will report a decrease in negative feelings Outcome: Progressing Pt stated she felt a little better than she did yesterday.  Goal: STG: Patient will remain free from self harm Outcome: Progressing Pt safe, no self harm thoughts or actions

## 2014-03-03 NOTE — Tx Team (Signed)
Interdisciplinary Treatment Plan Update (Adult) Date: 03/03/2014   Time Reviewed: 9:30 AM  Progress in Treatment: Attending groups: Yes Participating in groups: Yes Taking medication as prescribed: Yes Tolerating medication: Yes Family/Significant other contact made: Yes, CSW has spoken with patient's mother. Mother shared information that was incongruent with information provided by patient during initial assessment. Mother reports that patient has gotten into legal trouble for selling her suboxone.  Patient understands diagnosis: Yes Discussing patient identified problems/goals with staff: Yes Medical problems stabilized or resolved: Yes Denies suicidal/homicidal ideation: Yes Issues/concerns per patient self-inventory: Yes Other:  New problem(s) identified: N/A  Discharge Plan or Barriers: [place] [facility] psychiatrically [facility]. Patient appears to be minimizing so that she can discharge soon.  Reason for Continuation of Hospitalization:  Depression Anxiety Medication Stabilization   Comments: N/A  Estimated length of stay: 3-5 days  For review of initial/current patient goals, please see plan of care.   Attendees:  Patient:    Family:    Physician: Dr. Jama Flavorsobos; Dr. Dub MikesLugo  03/03/2014 9:30 AM   Nursing: Leighton ParodyBritney Tyson; [place] Minersonna Shimp, RN  03/03/2014 9:30 AM   Clinical Social Worker: Samuella BruinKristin Parys Elenbaas, LCSWA  03/03/2014 9:30 AM   Other: Juline PatchQuylle Hodnett, LCSW  03/03/2014 9:30 AM   Other: Leisa LenzValerie Enoch, Vesta [facility] Liaison  03/03/2014 9:30 AM   Other: Onnie BoerJennifer Clark, Case Manager  03/03/2014 9:30 AM   Other:  03/03/2014 9:30 AM        Scribe for Treatment Team:  Samuella BruinKristin Shaila Gilchrest, MSW, [employer]CSWA [phone]

## 2014-03-04 DIAGNOSIS — F4310 Post-traumatic stress disorder, unspecified: Secondary | ICD-10-CM

## 2014-03-04 DIAGNOSIS — F331 Major depressive disorder, recurrent, moderate: Secondary | ICD-10-CM

## 2014-03-04 MED ORDER — DIPHENHYDRAMINE HCL 50 MG PO CAPS: 50.0000 mg | ORAL_CAPSULE | Freq: Every evening | ORAL | Status: DC | PRN

## 2014-03-04 MED ORDER — DULOXETINE HCL 30 MG PO CPEP: 30.0000 mg | ORAL_CAPSULE | Freq: Every day | ORAL | Status: DC

## 2014-03-04 MED ORDER — CARBAMAZEPINE ER 200 MG PO TB12: 200.0000 mg | ORAL_TABLET | Freq: Two times a day (BID) | ORAL | Status: DC

## 2014-03-04 MED ORDER — ARIPIPRAZOLE 5 MG PO TABS: 5.0000 mg | ORAL_TABLET | Freq: Every day | ORAL | Status: DC

## 2014-03-04 MED ORDER — BUPRENORPHINE HCL 2 MG SL SUBL: 4.0000 mg | SUBLINGUAL_TABLET | Freq: Two times a day (BID) | SUBLINGUAL | Status: DC

## 2014-03-04 MED ORDER — BUPRENORPHINE HCL 2 MG SL SUBL: 4.0000 mg | SUBLINGUAL_TABLET | Freq: Every day | SUBLINGUAL | Status: DC

## 2014-03-04 NOTE — BHH Group Notes (Signed)
   [facility] Note  03/04/2014  8:45 AM   Participation Quality: Alert, Appropriate and Oriented  Mood/Affect: Depressed and Flat  Depression Rating: 4  Anxiety Rating: 4  Thoughts of Suicide: Pt denies SI/HI  Will you contract for safety? Yes  Current AVH: Pt denies  Plan for Discharge/Comments: Pt attended discharge planning group and actively participated in group. CSW provided pt with today's workbook. Patient reports that she is feeling "better". Patient feels ready to discharge back to current living situation to [facility]   Transportation Means: Pt reports access to transportation  Supports: No supports mentioned at this time  Samuella BruinKristin Josten [place], MSW, [employer]LCSWA Clinical Social Worker [facility] [phone]

## 2014-03-04 NOTE — Clinical Social Work Note (Addendum)
CSW and MD met with patient to discuss mother's concerns regarding patient's discharge. Patient reports that she does not speak with her mother regularly and that her mother is not knowledgeable about what is going on in her life. Patient denies that her mother's concerns are valid. Patient has provided CSW and MD with permission to speak with father to get collateral information.  CSW spoke with father Juanda Crumble to update on discharge plan. Father shared his concerns for patient, reports that he does not feel that she is committed to treatment or ready to make any changes in her life. Father verbalized his understanding that patient is wanting to return home and that [facility] hospital can only provide short-term crisis stabilization. He is agreeable to providing transportation for patient at discharge. CSW emphasized importance of encouraging patient to attend upcoming outpatient appointment. Father thanked CSW for assistance, no further questions or concerns at this time.  Tilden Fossa, MSW, [place] Worker [facility] [phone]

## 2014-03-04 NOTE — BHH Group Notes (Signed)
BHH LCSW Group Therapy 03/04/2014  1:15 PM   Type of Therapy: Group Therapy  Participation Level: Did Not Attend. Patient in bed- chose not to attend.   Samuella BruinKristin Priscella Donna, MSW, [employer]CSWA Clinical Social Worker [facility] [phone]

## 2014-03-04 NOTE — Progress Notes (Signed)
D   Pt is depressed and sad   She isolates to her room and stays in bed   She reports just not feeling good and requested to stay in her room A   Verbal support and encouragement given   Encouraged socialization and group attendance   Discussed with pt about her adl's and self cathe   Pt said she had been doing it for years and was familiar with sterile technique   Offered to assist pt if she wanted help with ADL's   Q 15 min checks for safety R   Pt safe at present and verbalized understanding

## 2014-03-04 NOTE — Progress Notes (Signed)
[facility] Discharge [facility] :  Will you be returning to the same living situation after discharge: Yes,  patient reports that she will be returning to her current living situation At discharge, do you have transportation home?:Yes,  patient's father will provide transportation Do you have the ability to pay for your medications:Yes,  patient will be provided with prescriptions at discharge.  Release of information consent forms completed and in the chart;  Patient's signature needed at discharge.  Patient to Follow up at: Follow-up Information   [facility]. (Appointment with Erskine SquibbJane on Thursday, October 13th at 11:15 am. Please call office if you need to reschedule appointment.)    Contact information:   [address] [place], [place] [address] Phone: [phone] Fax: [phone]      Patient denies SI/HI:   Yes,  denies    Safety Planning and Suicide Prevention discussed:  Yes,  with patient, mother, and father  Johny ChessDrinkard, Joniyah Mallinger L 03/04/2014, 2:27 PM
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
## 2014-03-04 NOTE — Clinical Social Work Note (Signed)
CSW spoke with patient's mother to update her on discharge plans and verify that mother can provide transportation. Patient's mother shared her concerns that her daughter is not taking treatment seriously and being manipulative in order to be discharged. Mother is concerned that patient continues to be a danger to herself and needs long term treatment. Mother reports that she is unable to provide transportation today if patient is discharged. CSW informed mother that patient has been coming to groups, denying SI, endorsing lower levels of depression and anxiety. Patient is also requesting to go home and does not appear to be interested in further treatment at this time.   CSW shared mother's concerns with MD.  Samuella BruinKristin Akesha Uresti, MSW, LCSWA Clinical Social Worker [facility] [phone]

## 2014-03-04 NOTE — Progress Notes (Signed)
Discharge note: Pt received both written and verbal discharge instructions. Pt verbalized understanding of discharge instructions. Pt agreed to f/u appt and med regimen. Pt denies SI/HI/AVH. Pt received sample meds, prescriptions and belongings.

## 2014-03-06 NOTE — Progress Notes (Signed)
Patient Discharge Instructions:  After Visit Summary (AVS):   Faxed to:  03/06/14 Discharge Summary Note:   Faxed to:  03/06/14 Psychiatric Admission Assessment Note:   Faxed to:  03/06/14 Suicide Risk Assessment - Discharge Assessment:   Faxed to:  03/06/14 Faxed/Sent to the Next Level Care provider:  03/06/14 [facility] @ [phone]  Jerelene ReddenSheena E Wann, 03/06/2014, 4:01 PM

## 2014-03-13 ENCOUNTER — Other Ambulatory Visit: Payer: Self-pay

## 2014-09-08 ENCOUNTER — Emergency Department (HOSPITAL_COMMUNITY)
Admission: EM | Admit: 2014-09-08 | Discharge: 2014-09-08 | Disposition: A | Discharge status: Home / Self Care | Payer: MEDICAID | Attending: Emergency Medicine | Admitting: Emergency Medicine

## 2014-09-08 ENCOUNTER — Encounter (HOSPITAL_COMMUNITY): Payer: Self-pay | Admitting: Emergency Medicine

## 2014-09-08 DIAGNOSIS — Z008 Encounter for other general examination: Secondary | ICD-10-CM | POA: Diagnosis present

## 2014-09-08 DIAGNOSIS — Z86718 Personal history of other venous thrombosis and embolism: Secondary | ICD-10-CM | POA: Diagnosis not present

## 2014-09-08 DIAGNOSIS — M419 Scoliosis, unspecified: Secondary | ICD-10-CM | POA: Insufficient documentation

## 2014-09-08 DIAGNOSIS — F4310 Post-traumatic stress disorder, unspecified: Secondary | ICD-10-CM | POA: Insufficient documentation

## 2014-09-08 DIAGNOSIS — F1110 Opioid abuse, uncomplicated: Secondary | ICD-10-CM | POA: Diagnosis not present

## 2014-09-08 DIAGNOSIS — Z79899 Other long term (current) drug therapy: Secondary | ICD-10-CM | POA: Diagnosis not present

## 2014-09-08 DIAGNOSIS — F319 Bipolar disorder, unspecified: Secondary | ICD-10-CM | POA: Insufficient documentation

## 2014-09-08 DIAGNOSIS — F1310 Sedative, hypnotic or anxiolytic abuse, uncomplicated: Secondary | ICD-10-CM | POA: Insufficient documentation

## 2014-09-08 DIAGNOSIS — F1210 Cannabis abuse, uncomplicated: Secondary | ICD-10-CM | POA: Diagnosis not present

## 2014-09-08 DIAGNOSIS — F1910 Other psychoactive substance abuse, uncomplicated: Secondary | ICD-10-CM

## 2014-09-08 DIAGNOSIS — Z720 Tobacco use: Secondary | ICD-10-CM | POA: Insufficient documentation

## 2014-09-08 DIAGNOSIS — F419 Anxiety disorder, unspecified: Secondary | ICD-10-CM | POA: Diagnosis not present

## 2014-09-08 DIAGNOSIS — F1410 Cocaine abuse, uncomplicated: Secondary | ICD-10-CM | POA: Insufficient documentation

## 2014-09-08 HISTORY — DX: Other psychoactive substance abuse, uncomplicated: F19.10

## 2014-09-08 LAB — CBC WITH DIFFERENTIAL/PLATELET
BASOS PCT: 0 % (ref 0–1)
Basophils Absolute: 0 10*3/uL (ref 0.0–0.1)
EOS ABS: 0.1 10*3/uL (ref 0.0–0.7)
EOS PCT: 1 % (ref 0–5)
HCT: 42.3 % (ref 36.0–46.0)
HEMOGLOBIN: 13.9 g/dL (ref 12.0–15.0)
Lymphocytes Relative: 20 % (ref 12–46)
Lymphs Abs: 2.2 10*3/uL (ref 0.7–4.0)
MCH: 28.8 pg (ref 26.0–34.0)
MCHC: 32.9 g/dL (ref 30.0–36.0)
MCV: 87.8 fL (ref 78.0–100.0)
MONOS PCT: 4 % (ref 3–12)
Monocytes Absolute: 0.5 10*3/uL (ref 0.1–1.0)
Neutro Abs: 8.4 10*3/uL — ABNORMAL HIGH (ref 1.7–7.7)
Neutrophils Relative %: 75 % (ref 43–77)
Platelets: 308 10*3/uL (ref 150–400)
RBC: 4.82 MIL/uL (ref 3.87–5.11)
RDW: 12.3 % (ref 11.5–15.5)
WBC: 11.2 10*3/uL — ABNORMAL HIGH (ref 4.0–10.5)

## 2014-09-08 LAB — RAPID URINE DRUG SCREEN, HOSP PERFORMED
Amphetamines: NOT DETECTED
BARBITURATES: NOT DETECTED
BENZODIAZEPINES: POSITIVE — AB
Cocaine: POSITIVE — AB
Opiates: POSITIVE — AB
Tetrahydrocannabinol: POSITIVE — AB

## 2014-09-08 LAB — BASIC METABOLIC PANEL
Anion gap: 9 (ref 5–15)
BUN: 8 mg/dL (ref 6–23)
CALCIUM: 9.4 mg/dL (ref 8.4–10.5)
CO2: 31 mmol/L (ref 19–32)
CREATININE: 0.76 mg/dL (ref 0.50–1.10)
Chloride: 102 mmol/L (ref 96–112)
Glucose, Bld: 87 mg/dL (ref 70–99)
Potassium: 3.6 mmol/L (ref 3.5–5.1)
Sodium: 142 mmol/L (ref 135–145)

## 2014-09-08 LAB — ETHANOL: Alcohol, Ethyl (B): <5 mg/dL (ref 0–9)

## 2014-09-08 NOTE — ED Notes (Signed)
Seeking medical clearance for detox.  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  Opiates & cocaine.  Last uses at 1745.  Roxicodone today and cocaine last night.

## 2014-09-08 NOTE — Discharge Instructions (Signed)
°Emergency Department Resource Guide °1) Find a Doctor and Pay Out of Pocket °Although you won't have to find out who is covered by your insurance plan, it is a good idea to ask around and get recommendations. You will then need to call the office and see if the doctor you have chosen will accept you as a new patient and what types of options they offer for patients who are self-pay. Some doctors offer discounts or will set up payment plans for their patients who do not have insurance, but you will need to ask so you aren't surprised when you get to your appointment. ° °2) Contact Your Local Health Department °Not all health departments have doctors that can see patients for sick visits, but many do, so it is worth a call to see if yours does. If you don't know where your local health department is, you can check in your phone book. The CDC also has a tool to help you locate your state's health department, and many state websites also have listings of all of their local health departments. ° °3) Find a Walk-in Clinic °If your illness is not likely to be very severe or complicated, you may want to try a walk in clinic. These are popping up all over the country in pharmacies, drugstores, and shopping centers. They're usually staffed by nurse practitioners or physician assistants that have been trained to treat common illnesses and complaints. They're usually fairly quick and inexpensive. However, if you have serious medical issues or chronic medical problems, these are probably not your best option. ° °No Primary Care Doctor: °- Call Health Connect at  832-8000 - they can help you locate a primary care doctor that  accepts your insurance, provides certain services, etc. °- Physician Referral Service- 1-800-533-3463 ° °Chronic Pain Problems: °Organization         Address  Phone   Notes  °[facility]  (336) 297-2271 Patients need to be referred by their primary care doctor.  ° °Medication  Assistance: °Organization         Address  Phone   Notes  °[place] [facility] [address] °[place], [place] [address] (336) 641-8030 --Must be a resident of [place] °-- Must have NO insurance coverage whatsoever (no Medicaid/ Medicare, etc.) °-- The pt. MUST have a primary care doctor that directs their care regularly and follows them in the community °  °[facility]  (866) 331-1348   °[employer]  (888) 892-1162   ° °Agencies that provide inexpensive medical care: °Organization         Address  Phone   Notes  °[facility]  (336) 832-8035   °[facility]    (336) 832-7272   °[facility] [address] °[place], [place] [address] (336) 832-4777   °[facility] [address], °[place] (336) 271-4999   °[facility]    (336) 373-0678   °[facility]    (336) 272-1050   °[facility] ° [address], [place] Phone:  (336) 832-4444, Fax:  (336) 832-4440 Hours of Operation:  9 am - 6 pm, M-F.  Also accepts Medicaid/Medicare and self-pay.  °[facility] ° [address], [place] Phone: (336) [phone], Fax: (336) [phone]. Hours of Operation:  8:30 am - 5:30 pm, M-F.  Also accepts Medicaid and self-pay.  °[facility] [place] [address], [place] Phone: (336) 878-6027   °[facility] [address], [place], [place] (336)723-1848, Ext. 123 Mondays & Thursdays: 7-9 AM.  First 15 patients are seen on a first come, first serve basis. °  ° °Medicaid-accepting [place] Providers: ° °Organization         Address  Phone   Notes  °[facility] [address], [place] (336) 641-2100 Also accepts self-pay patients.  °[facility] [address], [place] ° (336) 856-9996   °[facility] [address], [place]  (336) 288-8857   °[facility] [address] [place] [address], [place] (336) 299-7000   °Veita Bland [address], [place]  ° (336) 373-1557 Only accepts [place] Access Medicaid patients after they have their name applied to their card.  ° °Self-Pay (no insurance) in [place]: ° °Organization         Address  Phone   Notes  °Sickle Cell Patients, [facility] [address], [place] (336) 832-1970   °[facility] [address], [place] (336) 832-4400   °[facility] ° [address], [place] (336) 992-4800   °[facility]/Dr. Osei-Bonsu ° [address] [place] [address], [place] or [address], [place] (336) 841-8500 Phone number for both [place] and [place] locations is the same.  °[facility] [address], [place] (336) 299-0000   °[facility] [address] [place] [address], [place] or [address] (336) 852-7530 °(336) 878-2260   °[facility] [address], [place] (336) 350-1642, phone; (336) 294-5005, fax Sees patients 1st and 3rd Saturday of every month.  Must not qualify for public or private insurance (i.e. Medicaid, Medicare, [place] Health Choice, Veterans' Benefits) • Household income should be no more than 200% of the poverty level •The clinic cannot treat you if you are pregnant or think you are pregnant • Sexually transmitted diseases are not treated at the clinic.  ° ° °Dental Care: °Organization         Address  Phone  Notes  °[place] [facility] [address], [place] (336) 641-6152 Accepts children up to age 21 who are enrolled in Medicaid or [place] Health Choice; pregnant women with a Medicaid card; and children who have applied for Medicaid or [place] Health Choice, but were declined, whose parents can pay a reduced fee at time of service.  °[place]  [facility] [place]  [address], [place] (336) 641-7733 Accepts children up to age 21 who are enrolled in Medicaid or [place] Health Choice; pregnant women with a Medicaid card; and children who have applied for Medicaid or [place] Health Choice, but were declined, whose parents can pay a reduced fee at time of service.  °[facility] ° [address], [place] (336) 641-4533 Patients are seen by appointment only. Walk-ins are not accepted. [facility] will see patients 18 years of age and older. °Monday - Tuesday (8am-5pm) °Most Wednesdays (8:30-5pm) °$30 per visit, cash only  °[facility] ° [address], [place] (336) 641-4533 Patients are seen by appointment only. Walk-ins are not accepted. [facility] will see patients 18 years of age and older. °One   Wednesday Evening (Monthly: Volunteer Based).  $30 per visit, cash only  °[facility]  (919) [phone] for adults; Children under age 4, call Graduate Pediatric Dentistry at ([phone]) [phone]. Children aged 4-14, please call ([phone]) [phone] to request a pediatric application. ° Dental services are provided in all areas of dental care including fillings, crowns and bridges, complete and partial dentures, implants, gum treatment, root canals, and extractions. Preventive care is also provided. Treatment is provided to both adults and children. °Patients are selected via a lottery and there is often a waiting list. °  °[facility] [address], °[place] ° (336) 763-8833 [email or web address] °  °[facility] [address], [place], [place] (336)723-1848, Ext. 123 Second and Fourth Thursday of each month, opens at 6:30 AM; Clinic ends at 9 AM.  Patients are seen on a first-come first-served basis, and a limited number are seen during each clinic.  ° °[facility] ° [address], [place], [place] (336) 723-7904    Eligibility Requirements °You must have lived in [place], [place], or [place] counties for at least the last three months. °  You cannot be eligible for state or federal sponsored healthcare insurance, including Veterans Administration, Medicaid, or Medicare. °  You generally cannot be eligible for healthcare insurance through your employer.  °  How to apply: °Eligibility screenings are held every Tuesday and Wednesday afternoon from 1:00 pm until 4:00 pm. You do not need an appointment for the interview!  °[facility] [address], [place], [place] 336-631-2330   °[facility]  336-342-8273   °[facility]  336-703-3100   °[facility]  336-570-6415   ° °Behavioral Health Resources in the Community: °Intensive Outpatient Programs °Organization         Address  Phone  Notes  °[facility] [address], [place], [place] 336-878-6098   °[facility] [address], [place], [place] 336-832-9800   °[facility] [address], [place], [place] ° 336-882-2125   °[facility] [address],  °[place], [place] 1-800-853-5163 or 336-641-4981   °Substance Abuse Resources °Organization         Address  Phone  Notes  °[facility]  336-882-2125   °[facility]  336-784-9470   °[facility]  336-285-9073   °[facility]  336-845-3988   °Residential & Outpatient Substance Abuse Program  1-800-659-3381   °Psychological Services °Organization         Address  Phone  Notes  °[facility]  336- 832-9600   °[facility]  336- 378-7881   °[facility] [address], [place] 1-800-853-5163 or 336-641-4981   ° °Mobile Crisis Teams °Organization         Address  Phone  Notes  °[facility], Mobile Crisis Care Unit  1-877-626-1772   °Assertive °[facility] ° [address]  [place], [place] 336-834-9664   °Sharon DeEsch [address] °[place] 336-554-5454   ° °Self-Help/Support Groups °Organization         Address  Phone             Notes  °[facility] of  - variety of support groups  336- 373-1402 Call for more information  °Narcotics Anonymous (NA), [facility] [address], °[place]  2 meetings at this location  ° °  Residential Treatment Programs Organization         Address  Phone  Notes  [facility] [address],    [place]  [phone]   [facility]  [address], [place] [phone], [place], [place] [phone]   [facility] [address], [place] [phone]) [phone] Admissions: 8am-3pm M-F  [facility] [address]    [place], [place] [phone]   [facility] [address], [place], [place] [phone]   [facility] [address]  [place], [place] [phone]   [facility] Intensive Outpatient [address], [place], [place] [phone]   [facility] ([facility] [address]  [place], [place] [phone] or [phone]   [facility] ([facility] [address] [place], [place] [phone] Accepts Medicaid  [facility] [address]  [place] [phone] Substance Abuse/Addiction Treatment   [facility]         Address  Phone  Notes  [facility]  ([phone](888) [phone]   Angie FavaJulie Brannon, PhD [address], [address] [place], [place]   [phone]) [phone] or [phone]) [phone]   [facility]   [address] [place], [place] [phone](336) [phone]   [facility] [address], [place], [place] [phone]) [phone] Insurance/Medicaid/sponsorship through [facility] [address]                                    [place], [place] [phone]) [phone] Therapy/tele-psych/case    [facility] [address]   [place], [place] [phone](336) [phone]    Dr. Lolly MustacheArfeen  [phone]) [phone]   [facility] [place]  [employer] [facility] 1) [address],  2) [address], [place] 3)  [address], [place] [phone]  [phone]) [phone]   [facility] Child Abuse Hotline 9366477325(336) [phone] or [phone](336) [phone] (After Hours)      Take your usual prescriptions as previously directed.  Call your regular medical doctor as well as the detox resources given to you today, to schedule a follow up appointment within the next week.  Return to the Emergency Department immediately sooner if worsening.

## 2014-09-08 NOTE — ED Notes (Signed)
TTS in progress 
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
## 2014-10-26 ENCOUNTER — Emergency Department (HOSPITAL_COMMUNITY)
Admission: EM | Admit: 2014-10-26 | Discharge: 2014-10-26 | Disposition: A | Discharge status: Home / Self Care | Payer: Medicaid Other | Attending: Emergency Medicine | Admitting: Emergency Medicine

## 2014-10-26 ENCOUNTER — Encounter (HOSPITAL_COMMUNITY): Payer: Self-pay | Admitting: Emergency Medicine

## 2014-10-26 DIAGNOSIS — Z79899 Other long term (current) drug therapy: Secondary | ICD-10-CM | POA: Diagnosis not present

## 2014-10-26 DIAGNOSIS — Z8669 Personal history of other diseases of the nervous system and sense organs: Secondary | ICD-10-CM | POA: Insufficient documentation

## 2014-10-26 DIAGNOSIS — F319 Bipolar disorder, unspecified: Secondary | ICD-10-CM | POA: Insufficient documentation

## 2014-10-26 DIAGNOSIS — Z86718 Personal history of other venous thrombosis and embolism: Secondary | ICD-10-CM | POA: Diagnosis not present

## 2014-10-26 DIAGNOSIS — F419 Anxiety disorder, unspecified: Secondary | ICD-10-CM | POA: Insufficient documentation

## 2014-10-26 DIAGNOSIS — L02413 Cutaneous abscess of right upper limb: Secondary | ICD-10-CM | POA: Insufficient documentation

## 2014-10-26 DIAGNOSIS — L0291 Cutaneous abscess, unspecified: Secondary | ICD-10-CM

## 2014-10-26 DIAGNOSIS — Z720 Tobacco use: Secondary | ICD-10-CM | POA: Diagnosis not present

## 2014-10-26 DIAGNOSIS — Z8739 Personal history of other diseases of the musculoskeletal system and connective tissue: Secondary | ICD-10-CM | POA: Insufficient documentation

## 2014-10-26 DIAGNOSIS — F4310 Post-traumatic stress disorder, unspecified: Secondary | ICD-10-CM | POA: Insufficient documentation

## 2014-10-26 MED ORDER — SULFAMETHOXAZOLE-TRIMETHOPRIM 800-160 MG PO TABS: 1.0000 | ORAL_TABLET | Freq: Two times a day (BID) | ORAL | Status: AC

## 2014-10-26 MED ORDER — LIDOCAINE HCL (PF) 1 % IJ SOLN
INTRAMUSCULAR | Status: AC
  Administered 2014-10-26: 5 mL
  Filled 2014-10-26: qty 5

## 2014-10-26 MED ORDER — TRAMADOL HCL 50 MG PO TABS: 50.0000 mg | ORAL_TABLET | Freq: Four times a day (QID) | ORAL | Status: DC | PRN

## 2014-10-26 NOTE — Discharge Instructions (Signed)
Follow up with your md next week. °

## 2014-10-26 NOTE — ED Notes (Signed)
MD Zammit at bedside. 
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
## 2014-10-26 NOTE — ED Notes (Addendum)
Pt states she has abscess in right antecubital area that started about a week ago.  Pt states that this is the result of IV drug use but cannot remember what she injected.

## 2014-10-28 LAB — WOUND CULTURE: CULTURE: NO GROWTH

## 2014-12-07 ENCOUNTER — Encounter (HOSPITAL_COMMUNITY)
Admission: EM | Disposition: A | Discharge status: Home / Self Care | Payer: Self-pay | Source: Home / Self Care | Attending: Internal Medicine

## 2014-12-07 ENCOUNTER — Inpatient Hospital Stay (HOSPITAL_COMMUNITY)
Admission: EM | Admit: 2014-12-07 | Discharge: 2014-12-10 | DRG: 580 | Disposition: A | Discharge status: Home / Self Care | Payer: Medicaid Other | Attending: Internal Medicine | Admitting: Internal Medicine

## 2014-12-07 ENCOUNTER — Inpatient Hospital Stay (HOSPITAL_COMMUNITY): Payer: Medicaid Other | Admitting: Anesthesiology

## 2014-12-07 ENCOUNTER — Encounter (HOSPITAL_COMMUNITY): Payer: Self-pay | Admitting: *Deleted

## 2014-12-07 ENCOUNTER — Emergency Department (HOSPITAL_COMMUNITY): Payer: Medicaid Other

## 2014-12-07 ENCOUNTER — Inpatient Hospital Stay (HOSPITAL_COMMUNITY): Payer: Medicaid Other

## 2014-12-07 DIAGNOSIS — L02414 Cutaneous abscess of left upper limb: Secondary | ICD-10-CM

## 2014-12-07 DIAGNOSIS — G8220 Paraplegia, unspecified: Secondary | ICD-10-CM | POA: Diagnosis present

## 2014-12-07 DIAGNOSIS — F3130 Bipolar disorder, current episode depressed, mild or moderate severity, unspecified: Secondary | ICD-10-CM | POA: Diagnosis not present

## 2014-12-07 DIAGNOSIS — F1910 Other psychoactive substance abuse, uncomplicated: Secondary | ICD-10-CM

## 2014-12-07 DIAGNOSIS — F17210 Nicotine dependence, cigarettes, uncomplicated: Secondary | ICD-10-CM | POA: Diagnosis present

## 2014-12-07 DIAGNOSIS — L02511 Cutaneous abscess of right hand: Principal | ICD-10-CM | POA: Diagnosis present

## 2014-12-07 DIAGNOSIS — Z86718 Personal history of other venous thrombosis and embolism: Secondary | ICD-10-CM

## 2014-12-07 DIAGNOSIS — Z881 Allergy status to other antibiotic agents status: Secondary | ICD-10-CM

## 2014-12-07 DIAGNOSIS — L0390 Cellulitis, unspecified: Secondary | ICD-10-CM

## 2014-12-07 DIAGNOSIS — F1410 Cocaine abuse, uncomplicated: Secondary | ICD-10-CM | POA: Diagnosis present

## 2014-12-07 DIAGNOSIS — F319 Bipolar disorder, unspecified: Secondary | ICD-10-CM | POA: Diagnosis present

## 2014-12-07 DIAGNOSIS — F1290 Cannabis use, unspecified, uncomplicated: Secondary | ICD-10-CM | POA: Diagnosis present

## 2014-12-07 DIAGNOSIS — F4310 Post-traumatic stress disorder, unspecified: Secondary | ICD-10-CM | POA: Diagnosis present

## 2014-12-07 DIAGNOSIS — L03119 Cellulitis of unspecified part of limb: Secondary | ICD-10-CM

## 2014-12-07 DIAGNOSIS — L0291 Cutaneous abscess, unspecified: Secondary | ICD-10-CM | POA: Diagnosis present

## 2014-12-07 DIAGNOSIS — F419 Anxiety disorder, unspecified: Secondary | ICD-10-CM | POA: Diagnosis present

## 2014-12-07 HISTORY — PX: I & D EXTREMITY: SHX5045

## 2014-12-07 LAB — BASIC METABOLIC PANEL
ANION GAP: 9 (ref 5–15)
BUN: 10 mg/dL (ref 6–20)
CO2: 27 mmol/L (ref 22–32)
CREATININE: 0.57 mg/dL (ref 0.44–1.00)
Calcium: 8.6 mg/dL — ABNORMAL LOW (ref 8.9–10.3)
Chloride: 101 mmol/L (ref 101–111)
Glucose, Bld: 103 mg/dL — ABNORMAL HIGH (ref 65–99)
Potassium: 3.6 mmol/L (ref 3.5–5.1)
Sodium: 137 mmol/L (ref 135–145)

## 2014-12-07 LAB — CBC WITH DIFFERENTIAL/PLATELET
BASOS PCT: 0 % (ref 0–1)
Basophils Absolute: 0 10*3/uL (ref 0.0–0.1)
Eosinophils Absolute: 0.3 10*3/uL (ref 0.0–0.7)
Eosinophils Relative: 3 % (ref 0–5)
HEMATOCRIT: 38.7 % (ref 36.0–46.0)
HEMOGLOBIN: 13.2 g/dL (ref 12.0–15.0)
Lymphocytes Relative: 15 % (ref 12–46)
Lymphs Abs: 1.7 10*3/uL (ref 0.7–4.0)
MCH: 29.3 pg (ref 26.0–34.0)
MCHC: 34.1 g/dL (ref 30.0–36.0)
MCV: 86 fL (ref 78.0–100.0)
Monocytes Absolute: 0.7 10*3/uL (ref 0.1–1.0)
Monocytes Relative: 6 % (ref 3–12)
Neutro Abs: 8.6 10*3/uL — ABNORMAL HIGH (ref 1.7–7.7)
Neutrophils Relative %: 76 % (ref 43–77)
Platelets: 188 10*3/uL (ref 150–400)
RBC: 4.5 MIL/uL (ref 3.87–5.11)
RDW: 12.3 % (ref 11.5–15.5)
WBC: 11.3 10*3/uL — AB (ref 4.0–10.5)

## 2014-12-07 LAB — SEDIMENTATION RATE: Sed Rate: 10 mm/hr (ref 0–22)

## 2014-12-07 LAB — C-REACTIVE PROTEIN: CRP: 0.6 mg/dL (ref <1.0)

## 2014-12-07 SURGERY — IRRIGATION AND DEBRIDEMENT EXTREMITY
Anesthesia: General | Site: Hand | Laterality: Right

## 2014-12-07 MED ORDER — SODIUM CHLORIDE 0.9 % IJ SOLN
3.0000 mL | Freq: Two times a day (BID) | INTRAMUSCULAR | Status: DC
  Administered 2014-12-07 – 2014-12-08 (×4): 3 mL via INTRAVENOUS

## 2014-12-07 MED ORDER — PIPERACILLIN-TAZOBACTAM 3.375 G IVPB 30 MIN
3.3750 g | Freq: Once | INTRAVENOUS | Status: AC
  Administered 2014-12-07: 3.375 g via INTRAVENOUS
  Filled 2014-12-07: qty 50

## 2014-12-07 MED ORDER — FENTANYL CITRATE (PF) 250 MCG/5ML IJ SOLN
INTRAMUSCULAR | Status: AC
  Filled 2014-12-07: qty 5

## 2014-12-07 MED ORDER — CHLORHEXIDINE GLUCONATE 4 % EX LIQD
60.0000 mL | Freq: Once | CUTANEOUS | Status: DC
  Filled 2014-12-07: qty 60

## 2014-12-07 MED ORDER — CETYLPYRIDINIUM CHLORIDE 0.05 % MT LIQD
7.0000 mL | Freq: Two times a day (BID) | OROMUCOSAL | Status: DC
  Administered 2014-12-07 – 2014-12-10 (×4): 7 mL via OROMUCOSAL

## 2014-12-07 MED ORDER — ENOXAPARIN SODIUM 40 MG/0.4ML ~~LOC~~ SOLN
40.0000 mg | SUBCUTANEOUS | Status: DC
  Administered 2014-12-08 – 2014-12-09 (×2): 40 mg via SUBCUTANEOUS
  Filled 2014-12-07 (×3): qty 0.4

## 2014-12-07 MED ORDER — MORPHINE SULFATE 2 MG/ML IJ SOLN
1.0000 mg | INTRAMUSCULAR | Status: DC | PRN
  Administered 2014-12-07 – 2014-12-10 (×7): 1 mg via INTRAVENOUS
  Filled 2014-12-07 (×7): qty 1

## 2014-12-07 MED ORDER — FENTANYL CITRATE (PF) 250 MCG/5ML IJ SOLN
INTRAMUSCULAR | Status: DC | PRN
  Administered 2014-12-07: 50 ug via INTRAVENOUS
  Administered 2014-12-07: 150 ug via INTRAVENOUS
  Administered 2014-12-07 – 2014-12-08 (×3): 50 ug via INTRAVENOUS

## 2014-12-07 MED ORDER — LIDOCAINE HCL (CARDIAC) 20 MG/ML IV SOLN
INTRAVENOUS | Status: DC | PRN
  Administered 2014-12-07: 100 mg via INTRAVENOUS

## 2014-12-07 MED ORDER — PROPOFOL 10 MG/ML IV BOLUS
INTRAVENOUS | Status: DC | PRN
  Administered 2014-12-07: 200 mg via INTRAVENOUS

## 2014-12-07 MED ORDER — ACETAMINOPHEN 325 MG PO TABS: 650.0000 mg | ORAL_TABLET | Freq: Four times a day (QID) | ORAL | Status: DC | PRN

## 2014-12-07 MED ORDER — ACETAMINOPHEN 650 MG RE SUPP: 650.0000 mg | Freq: Four times a day (QID) | RECTAL | Status: DC | PRN

## 2014-12-07 MED ORDER — VANCOMYCIN HCL IN DEXTROSE 1-5 GM/200ML-% IV SOLN
1000.0000 mg | Freq: Two times a day (BID) | INTRAVENOUS | Status: DC
  Administered 2014-12-08 – 2014-12-10 (×5): 1000 mg via INTRAVENOUS
  Filled 2014-12-07 (×8): qty 200

## 2014-12-07 MED ORDER — CHLORHEXIDINE GLUCONATE 0.12 % MT SOLN
15.0000 mL | Freq: Two times a day (BID) | OROMUCOSAL | Status: DC
  Administered 2014-12-08 – 2014-12-09 (×4): 15 mL via OROMUCOSAL
  Filled 2014-12-07 (×6): qty 15

## 2014-12-07 MED ORDER — HYDROCODONE-ACETAMINOPHEN 5-325 MG PO TABS
1.0000 | ORAL_TABLET | Freq: Four times a day (QID) | ORAL | Status: DC | PRN
  Administered 2014-12-08 – 2014-12-10 (×7): 2 via ORAL
  Filled 2014-12-07 (×7): qty 2

## 2014-12-07 MED ORDER — BUPIVACAINE HCL (PF) 0.25 % IJ SOLN
INTRAMUSCULAR | Status: DC | PRN
  Administered 2014-12-07: 20 mL

## 2014-12-07 MED ORDER — LACTATED RINGERS IV SOLN
INTRAVENOUS | Status: DC | PRN
  Administered 2014-12-07: 23:00:00 via INTRAVENOUS

## 2014-12-07 MED ORDER — PIPERACILLIN-TAZOBACTAM 3.375 G IVPB
3.3750 g | Freq: Three times a day (TID) | INTRAVENOUS | Status: DC
  Administered 2014-12-08 – 2014-12-10 (×8): 3.375 g via INTRAVENOUS
  Filled 2014-12-07 (×11): qty 50

## 2014-12-07 MED ORDER — MIDAZOLAM HCL 2 MG/2ML IJ SOLN
INTRAMUSCULAR | Status: DC | PRN
  Administered 2014-12-07: 2 mg via INTRAVENOUS

## 2014-12-07 MED ORDER — VANCOMYCIN HCL IN DEXTROSE 1-5 GM/200ML-% IV SOLN
1000.0000 mg | Freq: Once | INTRAVENOUS | Status: DC
  Filled 2014-12-07: qty 200

## 2014-12-07 MED ORDER — CLINDAMYCIN PHOSPHATE 600 MG/50ML IV SOLN
600.0000 mg | Freq: Once | INTRAVENOUS | Status: AC
  Administered 2014-12-07: 600 mg via INTRAVENOUS
  Filled 2014-12-07: qty 50

## 2014-12-07 MED ORDER — MIDAZOLAM HCL 2 MG/2ML IJ SOLN
INTRAMUSCULAR | Status: AC
Start: 2014-12-07 — End: 2014-12-07
  Filled 2014-12-07: qty 2

## 2014-12-07 MED ORDER — ACETAMINOPHEN 650 MG RE SUPP
650.0000 mg | Freq: Once | RECTAL | Status: DC
  Filled 2014-12-07: qty 1

## 2014-12-07 MED ORDER — BUPIVACAINE HCL (PF) 0.25 % IJ SOLN
30.0000 mL | Freq: Once | INTRAMUSCULAR | Status: DC
  Filled 2014-12-07: qty 30

## 2014-12-07 MED ORDER — SODIUM CHLORIDE 0.9 % IR SOLN
Status: DC | PRN
  Administered 2014-12-07: 4000 mL

## 2014-12-07 MED ORDER — SODIUM CHLORIDE 0.9 % IV BOLUS (SEPSIS)
1000.0000 mL | Freq: Once | INTRAVENOUS | Status: AC
  Administered 2014-12-07: 1000 mL via INTRAVENOUS

## 2014-12-07 MED ORDER — MORPHINE SULFATE 4 MG/ML IJ SOLN
4.0000 mg | Freq: Once | INTRAMUSCULAR | Status: AC
  Administered 2014-12-07: 4 mg via INTRAVENOUS
  Filled 2014-12-07: qty 1

## 2014-12-07 SURGICAL SUPPLY — 51 items
BANDAGE COBAN STERILE 2 (GAUZE/BANDAGES/DRESSINGS) IMPLANT
BANDAGE ELASTIC 3 VELCRO ST LF (GAUZE/BANDAGES/DRESSINGS) ×3 IMPLANT
BANDAGE ELASTIC 4 VELCRO ST LF (GAUZE/BANDAGES/DRESSINGS) ×4 IMPLANT
BNDG CMPR 9X4 STRL LF SNTH (GAUZE/BANDAGES/DRESSINGS)
BNDG COHESIVE 1X5 TAN STRL LF (GAUZE/BANDAGES/DRESSINGS) IMPLANT
BNDG CONFORM 2 STRL LF (GAUZE/BANDAGES/DRESSINGS) IMPLANT
BNDG ESMARK 4X9 LF (GAUZE/BANDAGES/DRESSINGS) IMPLANT
BNDG GAUZE ELAST 4 BULKY (GAUZE/BANDAGES/DRESSINGS) ×3 IMPLANT
CORDS BIPOLAR (ELECTRODE) ×3 IMPLANT
COVER SURGICAL LIGHT HANDLE (MISCELLANEOUS) ×3 IMPLANT
DECANTER SPIKE VIAL GLASS SM (MISCELLANEOUS) ×3 IMPLANT
DRAIN PENROSE 1/4X12 LTX STRL (WOUND CARE) IMPLANT
DRSG ADAPTIC 3X8 NADH LF (GAUZE/BANDAGES/DRESSINGS) IMPLANT
DRSG EMULSION OIL 3X3 NADH (GAUZE/BANDAGES/DRESSINGS) ×3 IMPLANT
DRSG PAD ABDOMINAL 8X10 ST (GAUZE/BANDAGES/DRESSINGS) ×6 IMPLANT
GAUZE SPONGE 4X4 12PLY STRL (GAUZE/BANDAGES/DRESSINGS) ×4 IMPLANT
GAUZE XEROFORM 1X8 LF (GAUZE/BANDAGES/DRESSINGS) ×3 IMPLANT
GLOVE BIO SURGEON STRL SZ7.5 (GLOVE) ×3 IMPLANT
GLOVE BIOGEL PI IND STRL 8 (GLOVE) ×2 IMPLANT
GLOVE BIOGEL PI INDICATOR 8 (GLOVE) ×1
GOWN STRL REUS W/ TWL LRG LVL3 (GOWN DISPOSABLE) ×2 IMPLANT
GOWN STRL REUS W/TWL LRG LVL3 (GOWN DISPOSABLE) ×3
KIT BASIN OR (CUSTOM PROCEDURE TRAY) ×3 IMPLANT
KIT ROOM TURNOVER OR (KITS) ×3 IMPLANT
LOOP VESSEL MAXI BLUE (MISCELLANEOUS) IMPLANT
LOOP VESSEL MINI RED (MISCELLANEOUS) IMPLANT
MANIFOLD NEPTUNE II (INSTRUMENTS) ×3 IMPLANT
NDL HYPO 25X1 1.5 SAFETY (NEEDLE) IMPLANT
NEEDLE HYPO 25X1 1.5 SAFETY (NEEDLE) ×3 IMPLANT
NS IRRIG 1000ML POUR BTL (IV SOLUTION) ×3 IMPLANT
PACK ORTHO EXTREMITY (CUSTOM PROCEDURE TRAY) ×3 IMPLANT
PAD ARMBOARD 7.5X6 YLW CONV (MISCELLANEOUS) ×6 IMPLANT
PAD CAST 4YDX4 CTTN HI CHSV (CAST SUPPLIES) IMPLANT
PADDING CAST COTTON 4X4 STRL (CAST SUPPLIES) ×3
SCRUB BETADINE 4OZ XXX (MISCELLANEOUS) ×3 IMPLANT
SET CYSTO W/LG BORE CLAMP LF (SET/KITS/TRAYS/PACK) ×3 IMPLANT
SOLUTION BETADINE 4OZ (MISCELLANEOUS) ×3 IMPLANT
SPONGE LAP 18X18 X RAY DECT (DISPOSABLE) ×3 IMPLANT
SPONGE LAP 4X18 X RAY DECT (DISPOSABLE) ×3 IMPLANT
SUCTION FRAZIER TIP 10 FR DISP (SUCTIONS) ×3 IMPLANT
SUT ETHILON 4 0 PS 2 18 (SUTURE) ×3 IMPLANT
SUT MON AB 5-0 P3 18 (SUTURE) IMPLANT
SYR CONTROL 10ML LL (SYRINGE) ×1 IMPLANT
TOWEL OR 17X24 6PK STRL BLUE (TOWEL DISPOSABLE) ×3 IMPLANT
TOWEL OR 17X26 10 PK STRL BLUE (TOWEL DISPOSABLE) ×3 IMPLANT
TUBE ANAEROBIC SPECIMEN COL (MISCELLANEOUS) ×1 IMPLANT
TUBE CONNECTING 12X1/4 (SUCTIONS) ×3 IMPLANT
TUBE FEEDING 5FR 15 INCH (TUBING) IMPLANT
UNDERPAD 30X30 INCONTINENT (UNDERPADS AND DIAPERS) ×3 IMPLANT
WATER STERILE IRR 1000ML POUR (IV SOLUTION) ×3 IMPLANT
YANKAUER SUCT BULB TIP NO VENT (SUCTIONS) ×3 IMPLANT
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
## 2014-12-07 NOTE — Progress Notes (Signed)
Admission note:   Arrival Method: Via stretcher from ED. Mental Status: A&OX4. Telemetry: Placed on box #10.  Skin: Patient would not let RN complete a skin assessment at this time. Stating - "I feel very overwhelmed right now, I just want to be left alone."  Tubes: N/A IV: LAC NSL. Pain: Waiting on prn med orders.  Family: No one at bedside. Living Situation: From home alone, but states she has a friend staying with her temporarily. Safety Measures: Bed alarm in place.  6E Orientation: Oriented to unit and surroundings. Call bell within reach.   Kimball Manske, BSN, RN-BC.

## 2014-12-07 NOTE — ED Notes (Signed)
Pt c/o redness and swelling to right hand and an abscess to left AC area; pt states they appeared x 2 days ago

## 2014-12-07 NOTE — Anesthesia Procedure Notes (Signed)
Procedure Name: LMA Insertion Date/Time: 12/07/2014 11:15 PM Performed by: Molli HazardGORDON, Jarielys Girardot M Pre-anesthesia Checklist: Patient identified, Emergency Drugs available, Suction available and Patient being monitored Patient Re-evaluated:Patient Re-evaluated prior to inductionOxygen Delivery Method: Circle system utilized Preoxygenation: Pre-oxygenation with 100% oxygen Intubation Type: IV induction Ventilation: Mask ventilation without difficulty LMA: LMA inserted LMA Size: 4.0 Number of attempts: 1 Placement Confirmation: positive ETCO2 and breath sounds checked- equal and bilateral Tube secured with: Tape Dental Injury: Teeth and Oropharynx as per pre-operative assessment
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
## 2014-12-07 NOTE — ED Notes (Signed)
Patient states she wants to Cath herself with self- cath kit from home. Patient advised we can cath states she can cath self.

## 2014-12-07 NOTE — ED Provider Notes (Signed)
  Physical Exam  BP 109/55 mmHg  Pulse 87  Temp(Src) 99.3 F (37.4 C) (Oral)  Resp 18  Ht 5\' 4"  (1.626 m)  Wt 140 lb (63.504 kg)  BMI 24.02 kg/m2  SpO2 98%  LMP 11/30/2014  Physical Exam  Constitutional: She is oriented to person, place, and time. She appears well-developed and well-nourished. No distress.  HENT:  Head: Normocephalic and atraumatic.  Mouth/Throat: Oropharynx is clear and moist. No oropharyngeal exudate.  Eyes: Right eye exhibits no discharge. Left eye exhibits no discharge. No scleral icterus.  Neck: Normal range of motion.  Cardiovascular: Normal rate, regular rhythm and normal heart sounds.   No murmur heard. Pulmonary/Chest: Effort normal and breath sounds normal. No respiratory distress.  Abdominal: Soft. There is no tenderness.  Musculoskeletal: Normal range of motion. She exhibits no edema or tenderness.  Swelling to the radial portion of the carpals and metacarpals, and associated palmaris and dorsal swelling to the right hand. Radial pulse 2+. Distal sensation intact. Capillary refill less than 2 seconds distally.  Neurological: She is alert and oriented to person, place, and time. No cranial nerve deficit. Coordination normal.  Skin: Skin is warm and dry. No rash noted. She is not diaphoretic.  Psychiatric: She has a normal mood and affect.    ED Course  Procedures  MDM Patient was seen and evaluated by [facility] J[facility]avitz [facility], [facility] the patient could be evaluated by hand surgery for decision to go to the OR versus admission to medicine for a hand abscess and associated cellulitis in the setting of fever, tachycardia and mild leukocytosis. On arrival hand surgery team was contacted, requested we evaluate the patient. Hand surgery notified that the patient has arrived.   Spoke with Dr. Merlyn LotKuzma who wishes to consult on pt in the ED, and requests pt to be admitted to medicine.    Spoke with Dr. Benjamine MolaVann with  [facility]  for admission.  BP 109/55 mmHg  Pulse 87  Temp(Src) 99.3 F (37.4 C) (Oral)  Resp 18  Ht 5\' 4"  (1.626 m)  Wt 140 lb (63.504 kg)  BMI 24.02 kg/m2  SpO2 98%  LMP 11/30/2014  Signed,  Ladona MowJoe Dawt Reeb, PA-C 2:18 PM     Ladona MowJoe Irmalee Riemenschneider, PA-C 12/07/14 1418  Gwyneth SproutWhitney Plunkett, MD 12/07/14 [id number]

## 2014-12-07 NOTE — ED Notes (Signed)
Patient states she was using IV cocain and noticed her hand swollen. States she only uses once a month. States in pain. Swelling noted.
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
## 2014-12-08 ENCOUNTER — Encounter (HOSPITAL_COMMUNITY): Payer: Self-pay | Admitting: Orthopedic Surgery

## 2014-12-08 DIAGNOSIS — F3130 Bipolar disorder, current episode depressed, mild or moderate severity, unspecified: Secondary | ICD-10-CM

## 2014-12-08 DIAGNOSIS — G8220 Paraplegia, unspecified: Secondary | ICD-10-CM

## 2014-12-08 DIAGNOSIS — L0291 Cutaneous abscess, unspecified: Secondary | ICD-10-CM

## 2014-12-08 DIAGNOSIS — F1910 Other psychoactive substance abuse, uncomplicated: Secondary | ICD-10-CM

## 2014-12-08 DIAGNOSIS — L0390 Cellulitis, unspecified: Secondary | ICD-10-CM

## 2014-12-08 LAB — GRAM STAIN

## 2014-12-08 LAB — BASIC METABOLIC PANEL
ANION GAP: 11 (ref 5–15)
BUN: <5 mg/dL — ABNORMAL LOW (ref 6–20)
CHLORIDE: 104 mmol/L (ref 101–111)
CO2: 21 mmol/L — ABNORMAL LOW (ref 22–32)
Calcium: 8.4 mg/dL — ABNORMAL LOW (ref 8.9–10.3)
Creatinine, Ser: 0.72 mg/dL (ref 0.44–1.00)
GFR calc non Af Amer: >60 mL/min (ref >60)
Glucose, Bld: 78 mg/dL (ref 65–99)
Potassium: 4.2 mmol/L (ref 3.5–5.1)
SODIUM: 136 mmol/L (ref 135–145)

## 2014-12-08 LAB — CBC
HCT: 34 % — ABNORMAL LOW (ref 36.0–46.0)
Hemoglobin: 11.8 g/dL — ABNORMAL LOW (ref 12.0–15.0)
MCH: 29.6 pg (ref 26.0–34.0)
MCHC: 34.7 g/dL (ref 30.0–36.0)
MCV: 85.4 fL (ref 78.0–100.0)
PLATELETS: 171 10*3/uL (ref 150–400)
RBC: 3.98 MIL/uL (ref 3.87–5.11)
RDW: 12.2 % (ref 11.5–15.5)
WBC: 10.7 10*3/uL — AB (ref 4.0–10.5)

## 2014-12-08 LAB — HIV ANTIBODY (ROUTINE TESTING W REFLEX): HIV Screen 4th Generation wRfx: NONREACTIVE

## 2014-12-08 MED ORDER — HYDROMORPHONE HCL 1 MG/ML IJ SOLN
0.2500 mg | INTRAMUSCULAR | Status: DC | PRN
  Administered 2014-12-08 – 2014-12-10 (×4): 0.5 mg via INTRAVENOUS
  Filled 2014-12-08 (×2): qty 1

## 2014-12-08 MED ORDER — VANCOMYCIN HCL 1000 MG IV SOLR
1000.0000 mg | INTRAVENOUS | Status: DC | PRN
Start: 2014-12-08 — End: 2014-12-08
  Administered 2014-12-08: 1000 mg via INTRAVENOUS

## 2014-12-08 MED ORDER — PROMETHAZINE HCL 25 MG/ML IJ SOLN: 6.2500 mg | INTRAMUSCULAR | Status: DC | PRN

## 2014-12-08 MED ORDER — HYDROMORPHONE HCL 1 MG/ML IJ SOLN
INTRAMUSCULAR | Status: AC
  Filled 2014-12-08: qty 1

## 2014-12-08 NOTE — Plan of Care (Signed)
Problem: Phase I Progression Outcomes Goal: Wound assessment- dressing change as appropriate Outcome: Progressing Incision and drainage done.no order for dressing change

## 2014-12-08 NOTE — Brief Op Note (Signed)
12/07/2014 - 12/08/2014  12:19 AM  PATIENT:  Theresa Shaw  24 y.o. female  PRE-OPERATIVE DIAGNOSIS:  left arm abscess, right hand abscess  POST-OPERATIVE DIAGNOSIS:  left arm abscess, right hand abscess  PROCEDURE:  Procedure(s): IRRIGATION AND DEBRIDEMENT left arm (Left) IRRIGATION AND DEBRIDEMENT RIGHT HAND (Right)  SURGEON:  Surgeon(s) and Role: Panel 1:    * Betha LoaKevin Ryann Leavitt, MD - Primary  Panel 2:    * Betha LoaKevin Nataly Pacifico, MD - Primary  PHYSICIAN ASSISTANT:   ASSISTANTS: none   ANESTHESIA:   general  EBL:  Total I/O In: 0  Out: 100 [Urine:100]  BLOOD ADMINISTERED:none  DRAINS: iodoform packing  LOCAL MEDICATIONS USED:  MARCAINE     SPECIMEN:  Source of Specimen:  right hand and left arm  DISPOSITION OF SPECIMEN:  micro  COUNTS:  YES  TOURNIQUET:   Total Tourniquet Time Documented: Upper Arm (Right) - 17 minutes Total: Upper Arm (Right) - 17 minutes  Upper Arm (Left) - 10 minutes Total: Upper Arm (Left) - 10 minutes   DICTATION: .Other Dictation: Dictation Number [id number]  PLAN OF CARE: Discharge to home after PACU  PATIENT DISPOSITION:  PACU - hemodynamically stable.   Delay start of Pharmacological VTE agent (>24hrs) due to surgical blood loss or risk of bleeding: no

## 2014-12-08 NOTE — Anesthesia Postprocedure Evaluation (Signed)
Anesthesia Post Note  Patient: Theresa GeeHannah B Plotner  Procedure(s) Performed: Procedure(s) (LRB): IRRIGATION AND DEBRIDEMENT left arm (Left) IRRIGATION AND DEBRIDEMENT RIGHT HAND (Right)  Anesthesia type: general  Patient location: PACU  Post pain: Pain level controlled  Post assessment: Patient's Cardiovascular Status Stable  Last Vitals:  Filed Vitals:   12/08/14 0124  BP: 146/90  Pulse: 87  Temp: 36.8 C  Resp: 12    Post vital signs: Reviewed and stable  Level of consciousness: sedated  Complications: No apparent anesthesia complications

## 2014-12-08 NOTE — Transfer of Care (Signed)
Immediate Anesthesia Transfer of Care Note  Patient: Theresa Shaw  Procedure(s) Performed: Procedure(s): IRRIGATION AND DEBRIDEMENT left arm (Left) IRRIGATION AND DEBRIDEMENT RIGHT HAND (Right)  Patient Location: PACU  Anesthesia Type:General  Level of Consciousness: awake, alert  and oriented  Airway & Oxygen Therapy: Patient Spontanous Breathing  Post-op Assessment: Report given to RN and Post -op Vital signs reviewed and stable  Post vital signs: Reviewed and stable  Last Vitals:  Filed Vitals:   12/07/14 2200  BP: 118/71  Pulse: 101  Temp: 37.3 C  Resp: 18    Complications: No apparent anesthesia complications

## 2014-12-08 NOTE — Progress Notes (Signed)
Utilization review completed. Hamsini Verrilli, RN, BSN. 

## 2014-12-08 NOTE — Op Note (Signed)
[id number] 
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
## 2014-12-08 NOTE — Op Note (Signed)
[name]:  Theresa Shaw, Theresa Shaw              ACCOUNT NO.:  [id number]  MEDICAL RECORD NO.:  [id number]  LOCATION:  6E10C                        FACILITY:  [facility]  PHYSICIAN:  Betha LoaKevin Anuel Sitter, MD        DATE OF BIRTH:  1991-02-01  DATE OF PROCEDURE:  12/07/2014 DATE OF DISCHARGE:                              OPERATIVE REPORT   PREOPERATIVE DIAGNOSIS:  Right hand/wrist and left antecubital fossa abscesses.  POSTOPERATIVE DIAGNOSIS:  Right hand/wrist and left antecubital fossa abscesses.  PROCEDURE:   1. Incision and drainage of right hand and wrist abscess including first, second, and third dorsal compartments  2. Incision of left antecubital fossa abscess  SURGEON:  Betha LoaKevin Cassondra Stachowski, MD  ASSISTANT:  None.  ANESTHESIA:  General.  IV FLUIDS:  Per anesthesia flow sheet.  ESTIMATED BLOOD LOSS:  Minimal.  COMPLICATIONS:  None.  SPECIMENS:  None.  TOURNIQUET TIME:  17 minutes on the right, 10 minutes on the left.  DISPOSITION:  Stable to PACU.  INDICATIONS:  Theresa Shaw is a 24 year old female who states she was injecting cocaine over the past few days.  She has had progressive swelling, pain, and erythema of the right hand and  area in the left antecubital fossa, this is painful.  [facility] and [facility].  She has had some fevers and chills.  On examination, she had a swollen erythematous right hand with pain tracking up the dorsal radial aspect of the wrist.  There was also an abscess noted in the left antecubital fossa.  I recommended incision and drainage in the operating room.  Risks, benefits, and alternatives of surgery were discussed including risk of blood loss; infection; damage to nerves, vessels, tendons, ligaments, bone; failure of surgery; need for additional surgery; complications with wound healing, continued pain, continued infection, need for repeat irrigation and debridement.  She  voiced understanding of these risks and elected to proceed.  OPERATIVE COURSE:  After being identified preoperatively by myself, the patient and I agreed upon procedure and site of procedure.  Surgical site was marked.  The risks, benefits, and alternatives of surgery were reviewed, and she wished to proceed.  Surgical consent was signed.  She was transferred to the operating room and placed on the operating room table in supine position with left upper extremity on arm board. General anesthesia was induced by the anesthesiologist.  The left upper extremity was prepped and draped in normal sterile orthopedic fashion. Surgical pause was performed between surgeons, anesthesia, operating staff, and all were in agreement as to the patient, procedure and site of procedure.  Tourniquet at the proximal aspect of the right upper extremity was inflated to 250 mmHg after exsanguination of the limb with Esmarch bandage.  Incision was made at the dorsum of the hand at the thumb index web space where the injection site was.  This was carried into subcutaneous tissues by spreading technique.  Gross purulence was encountered.  Cultures were taken for aerobes and anaerobes.  The wound was extended proximally.  The abscess coursed down toward the third dorsal compartment and into the thumb index musculature.  This was  opened up.  The third dorsal compartment was opened.  There was some cloudy fluid within it.  The incision was extended up to the radial side of the forearm.  This was carried into subcutaneous tissues as well. The second and first dorsal compartments were opened.  The first dorsal compartment had some very cloudy fluid within it especially distally.  The area was debrided.  It was felt the entire abscess cavity had been delineated.  The wound was copiously irrigated with 2000 mL of sterile saline by cysto tubing.  It was then packed with quarter-inch iodoform gauze.  It was injected with  10 mL of 0.25% plain Marcaine to aid in postoperative analgesia.  It was then dressed with sterile 4x4s and ABD and wrapped with a Kerlix and Ace bandage.  Tourniquet was deflated at 17 minutes.  Attention was turned to the left upper extremity.  Left upper extremity was prepped and draped in normal sterile orthopedic fashion.  Surgical pause was again performed between surgeons, anesthesia, operating staff, and all were in agreement as to the patient, procedure, and site of procedure.  Tourniquet at the proximal aspect of the extremity was inflated to 250 mmHg after exsanguination of limb with an Esmarch bandage.  An incision was made over the fluctuant area at the antecubital fossa.  This was carried into subcutaneous tissues by spreading technique.  Gross purulence was again encountered.  Cultures were taken for aerobes and anaerobes.  The abscess cavity was demarcated.  It did not seem to course any deeper. This was in the subcutaneous tissues.  The wound was copiously irrigated with 1000 mL of sterile saline by cysto tubing.  The wound was then packed with quarter-inch iodoform gauze and injected with 10 mL of 0.25% plain Marcaine to aid in postoperative analgesia.  It was dressed with sterile 4x4s and wrapped with a Kerlix and Ace bandage.  Tourniquet was deflated at 10 minutes.  Fingertips were pink with brisk capillary refill after deflation of tourniquet.  Operative drapes were broken down, and the patient was awoken from anesthesia safely.  She was transferred back to stretcher and taken to PACU in stable condition. She will be admitted for IV antibiotics.  She is on the hospitalist service.  We will start hydrotherapy in approximately 3 days.     Betha Loa, MD   ______________________________ Betha Loa, MD    KK/MEDQ  D:  12/08/2014  T:  12/08/2014  Job:  [id number]

## 2014-12-09 LAB — BASIC METABOLIC PANEL
ANION GAP: 7 (ref 5–15)
BUN: <5 mg/dL — ABNORMAL LOW (ref 6–20)
CO2: 29 mmol/L (ref 22–32)
Calcium: 8.4 mg/dL — ABNORMAL LOW (ref 8.9–10.3)
Chloride: 103 mmol/L (ref 101–111)
Creatinine, Ser: 0.66 mg/dL (ref 0.44–1.00)
Glucose, Bld: 132 mg/dL — ABNORMAL HIGH (ref 65–99)
Potassium: 3.5 mmol/L (ref 3.5–5.1)
Sodium: 139 mmol/L (ref 135–145)

## 2014-12-09 LAB — CBC
HCT: 33.9 % — ABNORMAL LOW (ref 36.0–46.0)
Hemoglobin: 11.3 g/dL — ABNORMAL LOW (ref 12.0–15.0)
MCH: 28.8 pg (ref 26.0–34.0)
MCHC: 33.3 g/dL (ref 30.0–36.0)
MCV: 86.5 fL (ref 78.0–100.0)
Platelets: 179 10*3/uL (ref 150–400)
RBC: 3.92 MIL/uL (ref 3.87–5.11)
RDW: 12.4 % (ref 11.5–15.5)
WBC: 6.8 10*3/uL (ref 4.0–10.5)

## 2014-12-09 MED ORDER — FLEET ENEMA 7-19 GM/118ML RE ENEM
1.0000 | ENEMA | Freq: Once | RECTAL | Status: AC
  Administered 2014-12-09: 1 via RECTAL
  Filled 2014-12-09: qty 1

## 2014-12-09 MED ORDER — DOCUSATE SODIUM 100 MG PO CAPS
200.0000 mg | ORAL_CAPSULE | Freq: Two times a day (BID) | ORAL | Status: DC
  Administered 2014-12-09 – 2014-12-10 (×2): 200 mg via ORAL
  Filled 2014-12-09 (×3): qty 2

## 2014-12-09 NOTE — Progress Notes (Addendum)
Subjective: 2 Days Post-Op Procedure(s) (LRB): IRRIGATION AND DEBRIDEMENT left arm (Left) IRRIGATION AND DEBRIDEMENT RIGHT HAND (Right) Patient reports pain as moderate and controlled.    Objective: Vital signs in last 24 hours: Temp:  [97.8 F (36.6 C)-98.3 F (36.8 C)] 97.8 F (36.6 C) (07/13 1622) Pulse Rate:  [86-93] 88 (07/13 1622) Resp:  [17] 17 (07/13 1622) BP: (124-155)/(77-84) 153/80 mmHg (07/13 1622) SpO2:  [99 %-100 %] 99 % (07/13 1622)  Intake/Output from previous day: 07/12 0701 - 07/13 0700 In: 840 [P.O.:840] Out: 2050 [Urine:2050] Intake/Output this shift:     Recent Labs  12/07/14 0634 12/08/14 0513 12/09/14 0535  HGB 13.2 11.8* 11.3*    Recent Labs  12/08/14 0513 12/09/14 0535  WBC 10.7* 6.8  RBC 3.98 3.92  HCT 34.0* 33.9*  PLT 171 179    Recent Labs  12/08/14 0513 12/09/14 0535  NA 136 139  K 4.2 3.5  CL 104 103  CO2 21* 29  BUN <5* <5*  CREATININE 0.72 0.66  GLUCOSE 78 132*  CALCIUM 8.4* 8.4*   No results for input(s): LABPT, INR in the last 72 hours.  intact sensationand capillary refill all digits.  +epl/fpl/io.  dressing c/d/i.  Assessment/Plan: 2 Days Post-Op Procedure(s) (LRB): IRRIGATION AND DEBRIDEMENT left arm (Left) IRRIGATION AND DEBRIDEMENT RIGHT HAND (Right) WBC normalized.  Will start hydrotherapy tomorrow.  Okay for d/c after hydrotherapy.  Will resume hydrotherapy as outpatient.  Recommend antibiotic coverage for MRSA.  F/u early next week in office.  Theresa Shaw R 12/09/2014, 9:03 PM
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
## 2014-12-10 LAB — CBC
HCT: 34.3 % — ABNORMAL LOW (ref 36.0–46.0)
Hemoglobin: 11.4 g/dL — ABNORMAL LOW (ref 12.0–15.0)
MCH: 28.6 pg (ref 26.0–34.0)
MCHC: 33.2 g/dL (ref 30.0–36.0)
MCV: 86.2 fL (ref 78.0–100.0)
Platelets: 197 10*3/uL (ref 150–400)
RBC: 3.98 MIL/uL (ref 3.87–5.11)
RDW: 12.4 % (ref 11.5–15.5)
WBC: 6.8 10*3/uL (ref 4.0–10.5)

## 2014-12-10 MED ORDER — TRAMADOL HCL 50 MG PO TABS: 50.0000 mg | ORAL_TABLET | Freq: Four times a day (QID) | ORAL | Status: DC | PRN

## 2014-12-10 MED ORDER — AMOXICILLIN-POT CLAVULANATE 875-125 MG PO TABS: 1.0000 | ORAL_TABLET | Freq: Two times a day (BID) | ORAL | Status: AC

## 2014-12-10 NOTE — Progress Notes (Signed)
Theresa Shaw to be D/Shaw'd Home per MD order.  Discussed prescriptions and follow up appointments with the patient. Prescriptions given to patient, medication list explained in detail. Pt verbalized understanding.    Medication List    STOP taking these medications        albuterol 108 (90 BASE) MCG/ACT inhaler  Commonly known as:  PROVENTIL HFA;VENTOLIN HFA     diphenhydrAMINE 50 MG capsule  Commonly known as:  BENADRYL      TAKE these medications        amoxicillin-clavulanate 875-125 MG per tablet  Commonly known as:  AUGMENTIN  Take 1 tablet by mouth 2 (two) times daily.     ARIPiprazole 5 MG tablet  Commonly known as:  ABILIFY  Take 1 tablet (5 mg total) by mouth daily.     buprenorphine 2 MG Subl SL tablet  Commonly known as:  SUBUTEX  Place 2 tablets (4 mg total) under the tongue at bedtime.     buprenorphine 2 MG Subl SL tablet  Commonly known as:  SUBUTEX  Place 2 tablets (4 mg total) under the tongue 2 (two) times daily.     carbamazepine 200 MG 12 hr tablet  Commonly known as:  TEGRETOL XR  Take 1 tablet (200 mg total) by mouth 2 (two) times daily.     DULoxetine 30 MG capsule  Commonly known as:  CYMBALTA  Take 1 capsule (30 mg total) by mouth daily.     traMADol 50 MG tablet  Commonly known as:  ULTRAM  Take 1 tablet (50 mg total) by mouth every 6 (six) hours as needed.        Filed Vitals:   12/10/14 0908  BP: 118/68  Pulse: 74  Temp: 98.2 F (36.8 Shaw)  Resp: 16    Skin clean, dry and intact; with exception to open areas from I & D. IV catheter discontinued intact. Site without signs and symptoms of complications. Dressing and pressure applied. No complaints noted.  An After Visit Summary was printed and given to the patient. Patient escorted via stretcher, and D/Shaw home via ambulance service.  Modena NunneryHubbard, Theresa Shaw 12/10/2014 3:35 PM

## 2014-12-10 NOTE — Progress Notes (Signed)
Physical Therapy Wound Treatment Patient Details  Name: Theresa Shaw MRN: [id number] Date of Birth: [birth year]  Today's Date: 12/10/2014 Time: 1010-1057 Time Calculation (min): 47 min  Subjective  Subjective: "I did this to myself," pt stated about wounds. Patient and Family Stated Goals: get better Date of Onset: 12/05/14 Prior Treatments: surgical I&D  Pain Score: Pain Score: 10 Pt premedicated.  Wound Assessment  Wound / Incision (Open or Dehisced) 12/10/14 Incision - Open Elbow Left;Anterior Open incision after I&D (Active)  Dressing Type Moist to dry;Gauze (Comment);Compression wrap 12/10/2014 10:58 AM  Dressing Changed Changed 12/10/2014 10:58 AM  Dressing Status Old drainage 12/10/2014 10:58 AM  Dressing Change Frequency Daily 12/10/2014 10:58 AM  Site / Wound Assessment Granulation tissue;Pink;Red 12/10/2014 10:58 AM  % Wound base Red or Granulating 100% 12/10/2014 10:58 AM  % Wound base Yellow 0% 12/10/2014 10:58 AM  % Wound base Black 0% 12/10/2014 10:58 AM  % Wound base Other (Comment) 0% 12/10/2014 10:58 AM  Peri-wound Assessment Edema;Erythema (blanchable) 12/10/2014 10:58 AM  Wound Length (cm) 1 cm 12/10/2014 10:58 AM  Wound Width (cm) 3 cm 12/10/2014 10:58 AM  Wound Depth (cm) 2 cm 12/10/2014 10:58 AM  Undermining (cm) undermining 2-2.5 cm from 9-3 oclock 12/10/2014 10:58 AM  Margins Unattached edges (unapproximated) 12/10/2014 10:58 AM  Closure None 12/10/2014 10:58 AM  Drainage Amount Moderate 12/10/2014 10:58 AM  Drainage Description Serosanguineous 12/10/2014 10:58 AM  Treatment Hydrotherapy (Pulse lavage);Packing (Impregnated strip) 12/10/2014 10:58 AM     Wound / Incision (Open or Dehisced) 12/10/14 Incision - Open Hand Right;Posterior open incision after I&D (Active)  Dressing Type Moist to dry;Gauze (Comment);Compression wrap 12/10/2014 10:58 AM  Dressing Changed Changed 12/10/2014 10:58 AM  Dressing Status Old drainage 12/10/2014 10:58 AM  Dressing Change Frequency Daily  12/10/2014 10:58 AM  Site / Wound Assessment Granulation tissue;Bleeding;Red;Pink;Yellow 12/10/2014 10:58 AM  % Wound base Red or Granulating 95% 12/10/2014 10:58 AM  % Wound base Yellow 5% 12/10/2014 10:58 AM  % Wound base Black 0% 12/10/2014 10:58 AM  % Wound base Other (Comment) 0% 12/10/2014 10:58 AM  Peri-wound Assessment Edema;Erythema (blanchable) 12/10/2014 10:58 AM  Wound Length (cm) 8 cm 12/10/2014 10:58 AM  Wound Width (cm) 3 cm 12/10/2014 10:58 AM  Wound Depth (cm) 1 cm 12/10/2014 10:58 AM  Undermining (cm) undermining .5 9-11 oclock. 12/10/2014 10:58 AM  Margins Unattached edges (unapproximated) 12/10/2014 10:58 AM  Closure None 12/10/2014 10:58 AM  Drainage Amount Copious 12/10/2014 10:58 AM  Drainage Description Serosanguineous 12/10/2014 10:58 AM  Treatment Hydrotherapy (Pulse lavage);Packing (Impregnated strip) 12/10/2014 10:58 AM   Hydrotherapy Pulsed lavage therapy - wound location: lt antecubital fossa and rt posterior hand Pulsed Lavage with Suction (psi): 4 psi Pulsed Lavage with Suction - Normal Saline Used: 500 mL Pulsed Lavage Tip: Tip with splash shield   Wound Assessment and Plan  Wound Therapy - Assess/Plan/Recommendations Wound Therapy - Clinical Statement: Pt with open incision with clean wound beds. Recommend HHRN for dressing changes until pt returns to hand surgeon. Pt verbally instructed in wet to dry dressing changes. Pt not able to look at wound on rt hand. Wound Therapy - Functional Problem List: decr use of arms due to wounds in paraplegic pt. Factors Delaying/Impairing Wound Healing: Multiple medical problems;Substance abuse;Tobacco use Wound Therapy - Follow Up Recommendations: Home health RN (until returns to see hand surgeon) Wound Plan: Pt for dc home today.  Wound Therapy Goals- Improve the function of patient's integumentary system by progressing the wound(s) through the phases  of wound healing (inflammation - proliferation - remodeling) by:    Goals  will be updated until maximal potential achieved or discharge criteria met.  Discharge criteria: when goals achieved, discharge from hospital, MD decision/surgical intervention, no progress towards goals, refusal/missing three consecutive treatments without notification or medical reason.  GP     Kalid Ghan 12/10/2014, 11:59 AM  Suanne Marker PT 1983

## 2014-12-10 NOTE — Progress Notes (Signed)
ANTIBIOTIC CONSULT NOTE - FOLLOW UP  Pharmacy Consult for Vancomycin and Zosyn Indication: cellulitis  Allergies  Allergen Reactions  . Ciprofloxacin Nausea Only    Patient Measurements: Height: 5\' 4"  (162.6 cm) Weight: 164 lb 7.4 oz (74.6 kg) IBW/kg (Calculated) : 54.7  Vital Signs: Temp: 98.2 F (36.8 C) (07/14 0908) Temp Source: Oral (07/14 0908) BP: 118/68 mmHg (07/14 0908) Pulse Rate: 74 (07/14 0908) Intake/Output from previous day: 07/13 0701 - 07/14 0700 In: 1062 [P.O.:1062] Out: 1950 [Urine:1950]  Labs:  Recent Labs  12/08/14 0513 12/09/14 0535 12/10/14 0532  WBC 10.7* 6.8 6.8  HGB 11.8* 11.3* 11.4*  PLT 171 179 197  CREATININE 0.72 0.66  --    Estimated Creatinine Clearance: 105.5 mL/min (by C-G formula based on Cr of 0.66).   Assessment:  Day # 3 Vanc and Zosyn.  POD# 2 I&D.  Afebrile, WBC down to 6.8.    Awaiting final cultures. Few GPC in pairs and clusters in right hand abscess culture; no growth x 2 days in left hand abscess culture.  Blood cultures negative to date.  Goal of Therapy:  Vancomycin trough level 10-15 mcg/ml appropriate Zosyn dose for renal function and infection  Plan:   Continue Vancomycin 1 gram IV q12hrs.  Continue Zosyn 3.375 gm IV q8hrs (each over 4 hrs).  Will plan to check Vanc trough if Vanc to continue once cultures are final.   Next bmet by 7/16.  Dennie Fettersgan, Lanny Lipkin Donovan, [place] Pager: [phone] 12/10/2014,1:32 PM

## 2014-12-10 NOTE — Addendum Note (Signed)
Addendum  created 12/10/14 0853 by Heather RobertsJames Enjoli Tidd, MD   Modules edited: Anesthesia Attestations
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
## 2014-12-11 LAB — CULTURE, ROUTINE-ABSCESS: Culture: NO GROWTH

## 2014-12-12 LAB — CULTURE, BLOOD (ROUTINE X 2): Culture: NO GROWTH

## 2014-12-14 LAB — CULTURE, BLOOD (ROUTINE X 2): Culture: NO GROWTH

## 2014-12-14 LAB — ANAEROBIC CULTURE

## 2014-12-16 ENCOUNTER — Ambulatory Visit (HOSPITAL_COMMUNITY): Payer: Medicaid Other | Attending: Orthopedic Surgery | Admitting: Physical Therapy

## 2014-12-16 DIAGNOSIS — X58XXXD Exposure to other specified factors, subsequent encounter: Secondary | ICD-10-CM | POA: Diagnosis not present

## 2014-12-16 DIAGNOSIS — T798XXD Other early complications of trauma, subsequent encounter: Secondary | ICD-10-CM | POA: Insufficient documentation

## 2014-12-16 DIAGNOSIS — G8220 Paraplegia, unspecified: Secondary | ICD-10-CM | POA: Insufficient documentation

## 2014-12-16 DIAGNOSIS — M549 Dorsalgia, unspecified: Secondary | ICD-10-CM | POA: Diagnosis not present

## 2014-12-16 NOTE — Addendum Note (Signed)
Addended by: Bella KennedyUSSELL, CYNTHIA J on: 12/16/2014 04:56 PM   Modules accepted: Orders
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
## 2014-12-18 ENCOUNTER — Ambulatory Visit (HOSPITAL_COMMUNITY): Payer: Medicaid Other | Admitting: Physical Therapy

## 2014-12-18 DIAGNOSIS — T798XXD Other early complications of trauma, subsequent encounter: Secondary | ICD-10-CM | POA: Diagnosis not present
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
## 2014-12-21 ENCOUNTER — Ambulatory Visit (HOSPITAL_COMMUNITY): Payer: Medicaid Other | Admitting: Physical Therapy

## 2014-12-21 DIAGNOSIS — T798XXD Other early complications of trauma, subsequent encounter: Secondary | ICD-10-CM
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
## 2014-12-23 ENCOUNTER — Ambulatory Visit (HOSPITAL_COMMUNITY): Payer: Medicaid Other | Admitting: Physical Therapy

## 2014-12-23 DIAGNOSIS — T798XXD Other early complications of trauma, subsequent encounter: Secondary | ICD-10-CM | POA: Diagnosis not present
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
## 2014-12-24 ENCOUNTER — Ambulatory Visit (HOSPITAL_COMMUNITY): Payer: Self-pay | Admitting: Physical Therapy

## 2014-12-29 ENCOUNTER — Ambulatory Visit (HOSPITAL_COMMUNITY): Payer: Medicaid Other | Admitting: Physical Therapy

## 2014-12-29 ENCOUNTER — Telehealth (HOSPITAL_COMMUNITY): Payer: Self-pay

## 2014-12-29 NOTE — Telephone Encounter (Signed)
Patient's mom call to cancel her appt. Per her MD.

## 2015-01-26 ENCOUNTER — Ambulatory Visit (INDEPENDENT_AMBULATORY_CARE_PROVIDER_SITE_OTHER): Payer: Medicaid Other | Admitting: Adult Health

## 2015-01-26 ENCOUNTER — Encounter: Payer: Self-pay | Admitting: Adult Health

## 2015-01-26 VITALS — BP 110/70 | HR 88 | Ht 64.0 in

## 2015-01-26 DIAGNOSIS — N925 Other specified irregular menstruation: Secondary | ICD-10-CM

## 2015-01-26 DIAGNOSIS — Z3201 Encounter for pregnancy test, result positive: Secondary | ICD-10-CM | POA: Diagnosis not present

## 2015-01-26 DIAGNOSIS — Z3490 Encounter for supervision of normal pregnancy, unspecified, unspecified trimester: Secondary | ICD-10-CM

## 2015-01-26 DIAGNOSIS — O3680X0 Pregnancy with inconclusive fetal viability, not applicable or unspecified: Secondary | ICD-10-CM

## 2015-01-26 LAB — POCT URINE PREGNANCY: Preg Test, Ur: POSITIVE — AB

## 2015-01-26 MED ORDER — PRENATAL PLUS 27-1 MG PO TABS: 1.0000 | ORAL_TABLET | Freq: Every day | ORAL | Status: DC

## 2015-01-26 MED ORDER — PROMETHAZINE HCL 25 MG PO TABS: 25.0000 mg | ORAL_TABLET | Freq: Four times a day (QID) | ORAL | Status: DC | PRN

## 2015-01-26 NOTE — Patient Instructions (Signed)
First Trimester of Pregnancy The first trimester of pregnancy is from week 1 until the end of week 12 (months 1 through 3). A week after a sperm fertilizes an egg, the egg will implant on the wall of the uterus. This embryo will begin to develop into a baby. Genes from you and your partner are forming the baby. The female genes determine whether the baby is a boy or a girl. At 6-8 weeks, the eyes and face are formed, and the heartbeat can be seen on ultrasound. At the end of 12 weeks, all the baby's organs are formed.  Now that you are pregnant, you will want to do everything you can to have a healthy baby. Two of the most important things are to get good prenatal care and to follow your health care provider's instructions. Prenatal care is all the medical care you receive before the baby's birth. This care will help prevent, find, and treat any problems during the pregnancy and childbirth. BODY CHANGES Your body goes through many changes during pregnancy. The changes vary from woman to woman.   You may gain or lose a couple of pounds at first.  You may feel sick to your stomach (nauseous) and throw up (vomit). If the vomiting is uncontrollable, call your health care provider.  You may tire easily.  You may develop headaches that can be relieved by medicines approved by your health care provider.  You may urinate more often. Painful urination may mean you have a bladder infection.  You may develop heartburn as a result of your pregnancy.  You may develop constipation because certain hormones are causing the muscles that push waste through your intestines to slow down.  You may develop hemorrhoids or swollen, bulging veins (varicose veins).  Your breasts may begin to grow larger and become tender. Your nipples may stick out more, and the tissue that surrounds them (areola) may become darker.  Your gums may bleed and may be sensitive to brushing and flossing.  Dark spots or blotches (chloasma,  mask of pregnancy) may develop on your face. This will likely fade after the baby is born.  Your menstrual periods will stop.  You may have a loss of appetite.  You may develop cravings for certain kinds of food.  You may have changes in your emotions from day to day, such as being excited to be pregnant or being concerned that something may go wrong with the pregnancy and baby.  You may have more vivid and strange dreams.  You may have changes in your hair. These can include thickening of your hair, rapid growth, and changes in texture. Some women also have hair loss during or after pregnancy, or hair that feels dry or thin. Your hair will most likely return to normal after your baby is born. WHAT TO EXPECT AT YOUR PRENATAL VISITS During a routine prenatal visit:  You will be weighed to make sure you and the baby are growing normally.  Your blood pressure will be taken.  Your abdomen will be measured to track your baby's growth.  The fetal heartbeat will be listened to starting around week 10 or 12 of your pregnancy.  Test results from any previous visits will be discussed. Your health care provider may ask you:  How you are feeling.  If you are feeling the baby move.  If you have had any abnormal symptoms, such as leaking fluid, bleeding, severe headaches, or abdominal cramping.  If you have any questions. Other tests   that may be performed during your first trimester include:  Blood tests to find your blood type and to check for the presence of any previous infections. They will also be used to check for low iron levels (anemia) and Rh antibodies. Later in the pregnancy, blood tests for diabetes will be done along with other tests if problems develop.  Urine tests to check for infections, diabetes, or protein in the urine.  An ultrasound to confirm the proper growth and development of the baby.  An amniocentesis to check for possible genetic problems.  Fetal screens for  spina bifida and Down syndrome.  You may need other tests to make sure you and the baby are doing well. HOME CARE INSTRUCTIONS  Medicines  Follow your health care provider's instructions regarding medicine use. Specific medicines may be either safe or unsafe to take during pregnancy.  Take your prenatal vitamins as directed.  If you develop constipation, try taking a stool softener if your health care provider approves. Diet  Eat regular, well-balanced meals. Choose a variety of foods, such as meat or vegetable-based protein, fish, milk and low-fat dairy products, vegetables, fruits, and whole grain breads and cereals. Your health care provider will help you determine the amount of weight gain that is right for you.  Avoid raw meat and uncooked cheese. These carry germs that can cause birth defects in the baby.  Eating four or five small meals rather than three large meals a day may help relieve nausea and vomiting. If you start to feel nauseous, eating a few soda crackers can be helpful. Drinking liquids between meals instead of during meals also seems to help nausea and vomiting.  If you develop constipation, eat more high-fiber foods, such as fresh vegetables or fruit and whole grains. Drink enough fluids to keep your urine clear or pale yellow. Activity and Exercise  Exercise only as directed by your health care provider. Exercising will help you:  Control your weight.  Stay in shape.  Be prepared for labor and delivery.  Experiencing pain or cramping in the lower abdomen or low back is a good sign that you should stop exercising. Check with your health care provider before continuing normal exercises.  Try to avoid standing for long periods of time. Move your legs often if you must stand in one place for a long time.  Avoid heavy lifting.  Wear low-heeled shoes, and practice good posture.  You may continue to have sex unless your health care provider directs you  otherwise. Relief of Pain or Discomfort  Wear a good support bra for breast tenderness.   Take warm sitz baths to soothe any pain or discomfort caused by hemorrhoids. Use hemorrhoid cream if your health care provider approves.   Rest with your legs elevated if you have leg cramps or low back pain.  If you develop varicose veins in your legs, wear support hose. Elevate your feet for 15 minutes, 3-4 times a day. Limit salt in your diet. Prenatal Care  Schedule your prenatal visits by the twelfth week of pregnancy. They are usually scheduled monthly at first, then more often in the last 2 months before delivery.  Write down your questions. Take them to your prenatal visits.  Keep all your prenatal visits as directed by your health care provider. Safety  Wear your seat belt at all times when driving.  Make a list of emergency phone numbers, including numbers for family, friends, the hospital, and police and fire departments. General Tips    Ask your health care provider for a referral to a local prenatal education class. Begin classes no later than at the beginning of month 6 of your pregnancy.  Ask for help if you have counseling or nutritional needs during pregnancy. Your health care provider can offer advice or refer you to specialists for help with various needs.  Do not use hot tubs, steam rooms, or saunas.  Do not douche or use tampons or scented sanitary pads.  Do not cross your legs for long periods of time.  Avoid cat litter boxes and soil used by cats. These carry germs that can cause birth defects in the baby and possibly loss of the fetus by miscarriage or stillbirth.  Avoid all smoking, herbs, alcohol, and medicines not prescribed by your health care provider. Chemicals in these affect the formation and growth of the baby.  Schedule a dentist appointment. At home, brush your teeth with a soft toothbrush and be gentle when you floss. SEEK MEDICAL CARE IF:   You have  dizziness.  You have mild pelvic cramps, pelvic pressure, or nagging pain in the abdominal area.  You have persistent nausea, vomiting, or diarrhea.  You have a bad smelling vaginal discharge.  You have pain with urination.  You notice increased swelling in your face, hands, legs, or ankles. SEEK IMMEDIATE MEDICAL CARE IF:   You have a fever.  You are leaking fluid from your vagina.  You have spotting or bleeding from your vagina.  You have severe abdominal cramping or pain.  You have rapid weight gain or loss.  You vomit blood or material that looks like coffee grounds.  You are exposed to [place] measles and have never had them.  You are exposed to fifth disease or chickenpox.  You develop a severe headache.  You have shortness of breath.  You have any kind of trauma, such as from a fall or a car accident. Document Released: 05/09/2001 Document Revised: 09/29/2013 Document Reviewed: 03/25/2013 [facility] Patient Information 2015 [place], [place]. This information is not intended to replace advice given to you by your health care provider. Make sure you discuss any questions you have with your health care provider. Return in am for dating [place]
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
## 2015-01-27 ENCOUNTER — Ambulatory Visit (INDEPENDENT_AMBULATORY_CARE_PROVIDER_SITE_OTHER): Payer: Medicaid Other

## 2015-01-27 ENCOUNTER — Telehealth: Payer: Self-pay | Admitting: Adult Health

## 2015-01-27 DIAGNOSIS — O3680X0 Pregnancy with inconclusive fetal viability, not applicable or unspecified: Secondary | ICD-10-CM

## 2015-01-27 NOTE — Telephone Encounter (Signed)
Pt stopped in my office after [place] she is 6+5 weeks she is not sure what she will do yet, so numbers given for abortion clinics and she is aware probably got pregnant around July 29th give or take a few days as sperm can live 72 hours in vagina

## 2015-01-27 NOTE — Progress Notes (Signed)
[place] 6+5wks single IUP w/ys,pos fht 121bpm,normal ov's bilat,crl 50mm,edd 09/17/2015

## 2015-03-03 ENCOUNTER — Ambulatory Visit (INDEPENDENT_AMBULATORY_CARE_PROVIDER_SITE_OTHER): Payer: Medicaid Other | Admitting: Nurse Practitioner

## 2015-03-03 ENCOUNTER — Encounter: Payer: Self-pay | Admitting: Nurse Practitioner

## 2015-03-03 VITALS — BP 110/74 | Ht 64.0 in

## 2015-03-03 DIAGNOSIS — F1120 Opioid dependence, uncomplicated: Secondary | ICD-10-CM

## 2015-03-03 NOTE — Progress Notes (Signed)
Subjective:  Presents to discuss her recent addiction issues. Addicted to opiate pain medication. 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 Requesting a referral to a clinic within [facility] that can do Suboxone therapy due to transportation issues. Had a recent abortion.   Objective:   BP 110/74 mmHg  Ht  (1.626 m)  Wt   LMP 12/01/2014  Breastfeeding? Unknown NAD. Alert, oriented. Thoughts logical coherent and relevant. Lungs clear. Heart regular rate rhythm.  Assessment:  Problem List Items Addressed This Visit      Other   Opiate dependence (HCC) - Primary (Chronic)     Plan: Referral to to health for substance use disorder. Patient to seek help immediately if any severe problems.

## 2015-05-19 ENCOUNTER — Encounter: Payer: Self-pay | Admitting: Family Medicine

## 2015-05-19 ENCOUNTER — Ambulatory Visit (INDEPENDENT_AMBULATORY_CARE_PROVIDER_SITE_OTHER): Payer: Medicaid Other | Admitting: Family Medicine

## 2015-05-19 VITALS — BP 108/64 | Ht 64.0 in | Wt 164.0 lb

## 2015-05-19 DIAGNOSIS — G8220 Paraplegia, unspecified: Secondary | ICD-10-CM
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
## 2015-05-20 ENCOUNTER — Encounter: Payer: Self-pay | Admitting: Family Medicine

## 2015-05-25 ENCOUNTER — Ambulatory Visit: Payer: Medicaid Other | Admitting: Family Medicine

## 2015-06-11 ENCOUNTER — Ambulatory Visit (HOSPITAL_COMMUNITY): Payer: Medicaid Other | Attending: Family Medicine | Admitting: Physical Therapy

## 2015-06-11 DIAGNOSIS — G8222 Paraplegia, incomplete: Secondary | ICD-10-CM | POA: Diagnosis present

## 2015-06-11 NOTE — Patient Instructions (Signed)
Abduction With Resistance    Put legs together and place hands on outside of thighs. Spread legs while resisting with hands for _2__ seconds. Repeat _10__ times. Do __1_ sessions per day. Note: If possible, place feet on floor.  Copyright  VHI. All rights reserved.  Adduction With Resistance    Spread legs and place hands on inside of thighs. Squeeze legs together while resisting with hands for _3__ seconds. Repeat __10_ times. Do _2__ sessions per day. Note: If possible, place feet on floor. May use a pillow between your knees.  Copyright  VHI. All rights reserved.  Heel Dig    Leg supports removed, feet on floor, one leg out but slightly bent, press heel into floor. Hold __2_ seconds. Press heel _10__ times each leg. Do _2__ sessions per day.  Copyright  VHI. All rights reserved.  Heel Raise   Attempt  Rise up on toes of both feet. Hold _1-2__ seconds. Repeat _10__ times. Do _1__ sessions per day. Apply manual resistance to thighs. Note: If possible, place feet on floor. If unable to do with both legs, do one leg at a time.  Copyright  VHI. All rights reserved.  Knee Extension (Sitting)    Attempt to straighten your Right leg out as far as possible.  Try and hold for 2 seconds  Repeat __10__ times per set. Do ____ sets per session. Do 2____ sessions per day. 1 http://orth.exer.us/732   Copyright  VHI. All rights reserved.  Dorsiflexion: Stretch - Heel Cord / Gastrocnemius    Position (A) Patient: Lie or sit with knee straight. Helper: Cup left heel. Make sure grip is firm. Motion (B) - Helper uses forearm to apply pressure to entire sole of foot, stretching foot toward shin. CAUTION: Stretch should be felt in calf. Do not allow foot to twist. Hold _20__ seconds. Repeat _3__ times. Repeat with other leg. Do _1__ sessions per day. Variation: Contract method: Resist ___ seconds. (see card G.G.-14)   Copyright  VHI. All rights reserved.  PROM: Ankle Plantar /  Dorsiflexion    Gently grasp right foot and bend foot and ankle up  Hold  position __15__ seconds. Repeat ___3_ times per set. Do _1___ sets per session. Do _2___ sessions per day. Have someone else move foot.  http://orth.exer.us/62   Copyright  VHI. All rights reserved.
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
## 2015-06-17 ENCOUNTER — Encounter: Payer: Medicaid Other | Admitting: Obstetrics & Gynecology

## 2015-07-01 ENCOUNTER — Encounter: Payer: Self-pay | Admitting: Obstetrics & Gynecology

## 2015-07-01 ENCOUNTER — Ambulatory Visit (INDEPENDENT_AMBULATORY_CARE_PROVIDER_SITE_OTHER): Payer: Medicaid Other | Admitting: Obstetrics & Gynecology

## 2015-07-01 VITALS — BP 100/60 | HR 74 | Wt 165.0 lb

## 2015-07-01 DIAGNOSIS — Z3009 Encounter for other general counseling and advice on contraception: Secondary | ICD-10-CM | POA: Diagnosis not present

## 2015-07-01 NOTE — Progress Notes (Signed)
Patient ID: Theresa Shaw, female   DOB: 1990/06/04, 25 y.o.   MRN: [id number] Pt in to discuss mirena vs other options Pt is a paraplegic and has had before and did well  She loved it amenorrheic Recommend replace on menses  She will call on her period No need for ordering     Face to face time:  10 minutes  Greater than 50% of the visit time was spent in counseling and coordination of care with the patient.  The summary and outline of the counseling and care coordination is summarized in the note above.   All questions were answered.

## 2015-07-28 ENCOUNTER — Ambulatory Visit: Payer: Medicaid Other | Admitting: Obstetrics and Gynecology

## 2015-08-02 ENCOUNTER — Encounter (HOSPITAL_COMMUNITY): Payer: Self-pay

## 2015-08-30 ENCOUNTER — Encounter: Payer: Self-pay | Admitting: *Deleted

## 2015-08-30 ENCOUNTER — Ambulatory Visit: Payer: Medicaid Other | Admitting: Obstetrics and Gynecology

## 2015-09-10 ENCOUNTER — Emergency Department (HOSPITAL_COMMUNITY): Payer: Medicaid Other

## 2015-09-10 ENCOUNTER — Emergency Department (HOSPITAL_COMMUNITY)
Admission: EM | Admit: 2015-09-10 | Discharge: 2015-09-10 | Disposition: A | Discharge status: Home / Self Care | Payer: Medicaid Other | Attending: Emergency Medicine | Admitting: Emergency Medicine

## 2015-09-10 ENCOUNTER — Encounter (HOSPITAL_COMMUNITY): Payer: Self-pay | Admitting: Emergency Medicine

## 2015-09-10 ENCOUNTER — Ambulatory Visit (HOSPITAL_COMMUNITY)
Admission: EM | Admit: 2015-09-10 | Discharge: 2015-09-10 | Disposition: A | Discharge status: Home / Self Care | Payer: No Typology Code available for payment source | Source: Ambulatory Visit | Attending: Emergency Medicine | Admitting: Emergency Medicine

## 2015-09-10 DIAGNOSIS — Z23 Encounter for immunization: Secondary | ICD-10-CM | POA: Insufficient documentation

## 2015-09-10 DIAGNOSIS — F17210 Nicotine dependence, cigarettes, uncomplicated: Secondary | ICD-10-CM | POA: Insufficient documentation

## 2015-09-10 DIAGNOSIS — Y999 Unspecified external cause status: Secondary | ICD-10-CM | POA: Diagnosis not present

## 2015-09-10 DIAGNOSIS — Y929 Unspecified place or not applicable: Secondary | ICD-10-CM | POA: Diagnosis not present

## 2015-09-10 DIAGNOSIS — T7421XA Adult sexual abuse, confirmed, initial encounter: Secondary | ICD-10-CM | POA: Diagnosis not present

## 2015-09-10 DIAGNOSIS — F329 Major depressive disorder, single episode, unspecified: Secondary | ICD-10-CM | POA: Diagnosis not present

## 2015-09-10 DIAGNOSIS — S0083XA Contusion of other part of head, initial encounter: Secondary | ICD-10-CM | POA: Insufficient documentation

## 2015-09-10 DIAGNOSIS — Y9389 Activity, other specified: Secondary | ICD-10-CM | POA: Insufficient documentation

## 2015-09-10 DIAGNOSIS — Z0441 Encounter for examination and observation following alleged adult rape: Secondary | ICD-10-CM | POA: Diagnosis not present

## 2015-09-10 LAB — POC URINE PREG, ED: PREG TEST UR: NEGATIVE

## 2015-09-10 LAB — URINALYSIS, ROUTINE W REFLEX MICROSCOPIC
BILIRUBIN URINE: NEGATIVE
GLUCOSE, UA: NEGATIVE mg/dL
Leukocytes, UA: NEGATIVE
Nitrite: POSITIVE — AB
Specific Gravity, Urine: 1.025 (ref 1.005–1.030)
pH: 6 (ref 5.0–8.0)

## 2015-09-10 LAB — URINE MICROSCOPIC-ADD ON

## 2015-09-10 MED ORDER — TETANUS-DIPHTH-ACELL PERTUSSIS 5-2.5-18.5 LF-MCG/0.5 IM SUSP
0.5000 mL | Freq: Once | INTRAMUSCULAR | Status: AC
  Administered 2015-09-10: 0.5 mL via INTRAMUSCULAR
  Filled 2015-09-10: qty 0.5

## 2015-09-10 MED ORDER — ULIPRISTAL ACETATE 30 MG PO TABS
30.0000 mg | ORAL_TABLET | Freq: Once | ORAL | Status: AC
Start: 2015-09-10 — End: 2015-09-10
  Administered 2015-09-10: 30 mg via ORAL

## 2015-09-10 MED ORDER — MORPHINE SULFATE (PF) 4 MG/ML IV SOLN
4.0000 mg | Freq: Once | INTRAVENOUS | Status: AC
  Administered 2015-09-10: 4 mg via INTRAMUSCULAR
  Filled 2015-09-10: qty 1

## 2015-09-10 MED ORDER — AZITHROMYCIN 250 MG PO TABS
1000.0000 mg | ORAL_TABLET | Freq: Once | ORAL | Status: AC
  Administered 2015-09-10: 1000 mg via ORAL

## 2015-09-10 MED ORDER — CEPHALEXIN 500 MG PO CAPS: 500.0000 mg | ORAL_CAPSULE | Freq: Four times a day (QID) | ORAL | Status: DC

## 2015-09-10 MED ORDER — PROMETHAZINE HCL 12.5 MG PO TABS
25.0000 mg | ORAL_TABLET | Freq: Four times a day (QID) | ORAL | Status: DC | PRN
Start: 2015-09-10 — End: 2015-09-10
  Administered 2015-09-10: 25 mg via ORAL

## 2015-09-10 MED ORDER — HYDROCODONE-ACETAMINOPHEN 5-325 MG PO TABS
2.0000 | ORAL_TABLET | Freq: Once | ORAL | Status: DC
  Filled 2015-09-10: qty 2

## 2015-09-10 MED ORDER — ACETAMINOPHEN 325 MG PO TABS: 650.0000 mg | ORAL_TABLET | Freq: Once | ORAL | Status: DC

## 2015-09-10 MED ORDER — LIDOCAINE HCL (PF) 1 % IJ SOLN
0.9000 mL | Freq: Once | INTRAMUSCULAR | Status: AC
  Administered 2015-09-10: 0.9 mL

## 2015-09-10 MED ORDER — METRONIDAZOLE 500 MG PO TABS
2000.0000 mg | ORAL_TABLET | Freq: Once | ORAL | Status: AC
  Administered 2015-09-10: 2000 mg via ORAL

## 2015-09-10 MED ORDER — CEFTRIAXONE SODIUM 250 MG IJ SOLR
250.0000 mg | Freq: Once | INTRAMUSCULAR | Status: AC
  Administered 2015-09-10: 250 mg via INTRAMUSCULAR
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
## 2015-09-10 NOTE — ED Notes (Signed)
Patient refusing vicodin, states she is taking suboxone.

## 2015-09-10 NOTE — ED Notes (Signed)
SANE nurse at bedside.

## 2015-09-10 NOTE — ED Notes (Signed)
SANE nurse called at request of patient. [employer] at bedside.
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
## 2015-09-10 NOTE — ED Notes (Signed)
Patient d/c papers given and reviewed. No further questions. Patient d/c'd in care of mother.
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
## 2015-09-13 LAB — URINE CULTURE: Culture: >=100000 — AB

## 2015-09-14 ENCOUNTER — Telehealth: Payer: Self-pay | Admitting: *Deleted

## 2015-09-14 NOTE — ED Notes (Signed)
Post ED Visit - Positive Culture Follow-up  Culture report reviewed by antimicrobial stewardship pharmacist:  []  Enzo BiNathan Batchelder, Pharm.D. []  Celedonio MiyamotoJeremy Frens, Pharm.D., BCPS []  Garvin FilaMike Maccia, Pharm.D. []  Georgina PillionElizabeth Martin, Pharm.D., BCPS []  WhitestoneMinh Pham, [place]Pharm[place]D., BCPS, AAHIVP [x]  Estella HuskMichelle Turner, Pharm.D., BCPS, AAHIVP []  Tennis Mustassie Stewart, Pharm.D. []  Rob FredericVincent, [address] BoulevardPharm[address]  Positive urine culture Treated with Cephalexin, organism sensitive to the same and no further patient follow-up is required at this time.  Virl AxeRobertson, Obie Silos [facility] 09/14/2015, 11:09 AM

## 2015-09-16 ENCOUNTER — Telehealth: Payer: Self-pay | Admitting: Family Medicine

## 2015-09-16 DIAGNOSIS — Z79899 Other long term (current) drug therapy: Secondary | ICD-10-CM

## 2015-09-16 DIAGNOSIS — R5383 Other fatigue: Secondary | ICD-10-CM

## 2015-09-16 DIAGNOSIS — Z131 Encounter for screening for diabetes mellitus: Secondary | ICD-10-CM

## 2015-09-16 DIAGNOSIS — R748 Abnormal levels of other serum enzymes: Secondary | ICD-10-CM

## 2015-09-16 NOTE — Telephone Encounter (Signed)
Patient is requesting complete lab work up.

## 2015-09-16 NOTE — Telephone Encounter (Signed)
Not quite sure what this means. It is not like there is an extensive amount of testing done for someone her age. Need a little bit more details in regards to what the patient is trying to detect or find. Or is it just a matter of trying to figure out how her cholesterol and sugar is?

## 2015-09-17 NOTE — Telephone Encounter (Signed)
Pt wants screening bw for diabetes, liver, kidneys and also bw to check to see why cocaine metabolites keep showing up in her system. Pt states she is seeing dr Gerilyn [place] and has tested positive the last four times. She is on suboxone and states she has not done cocaine. It has never been her drug of choice.  Pt states he has also done two swabs that came back positive for cocaine and wants to know why this is showing up when she has not done any cocaine.

## 2015-09-17 NOTE — Telephone Encounter (Signed)
TCNA 

## 2015-09-19 NOTE — Telephone Encounter (Signed)
I would recommend testing hemoglobin A1c, anabolic 7, liver, CBC. As for why her tests keeps coming back positive for cocaine I do not have a good answer for that. The patient can request to do a urine drug screen with Dr.[place] possibly that will be more accurate but I don't know.

## 2015-09-20 NOTE — Telephone Encounter (Signed)
TCNA (vm box not setup) 

## 2015-09-20 NOTE — Addendum Note (Signed)
Addended by: Dereck LigasJOHNSON, Sarahmarie Leavey P on: 09/20/2015 08:57 AM   Modules accepted: Orders

## 2015-09-20 NOTE — Telephone Encounter (Signed)
Notified patient would recommend testing hemoglobin A1c, metabolic 7, liver, CBC. As for why her tests keeps coming back positive for cocaine do not have a good answer for that. The patient can request to do a urine drug screen with Dr.[place] possibly that will be more accurate. Patient verbalized understanding.

## 2015-09-22 LAB — CBC WITH DIFFERENTIAL/PLATELET
BASOS ABS: 0 10*3/uL (ref 0.0–0.2)
Basos: 0 %
EOS (ABSOLUTE): 0.2 10*3/uL (ref 0.0–0.4)
Eos: 3 %
HEMATOCRIT: 42.4 % (ref 34.0–46.6)
HEMOGLOBIN: 14.2 g/dL (ref 11.1–15.9)
IMMATURE GRANS (ABS): 0 10*3/uL (ref 0.0–0.1)
IMMATURE GRANULOCYTES: 0 %
LYMPHS ABS: 1.8 10*3/uL (ref 0.7–3.1)
LYMPHS: 23 %
MCH: 29.2 pg (ref 26.6–33.0)
MCHC: 33.5 g/dL (ref 31.5–35.7)
MCV: 87 fL (ref 79–97)
Monocytes Absolute: 0.6 10*3/uL (ref 0.1–0.9)
Monocytes: 8 %
NEUTROS ABS: 5 10*3/uL (ref 1.4–7.0)
Neutrophils: 66 %
PLATELETS: 250 10*3/uL (ref 150–379)
RBC: 4.87 x10E6/uL (ref 3.77–5.28)
RDW: 12.6 % (ref 12.3–15.4)
WBC: 7.7 10*3/uL (ref 3.4–10.8)

## 2015-09-22 LAB — HEPATIC FUNCTION PANEL
ALT: 150 IU/L — AB (ref 0–32)
AST: 67 IU/L — AB (ref 0–40)
Albumin: 4.6 g/dL (ref 3.5–5.5)
Alkaline Phosphatase: 72 IU/L (ref 39–117)
BILIRUBIN TOTAL: 0.4 mg/dL (ref 0.0–1.2)
BILIRUBIN, DIRECT: 0.08 mg/dL (ref 0.00–0.40)
Total Protein: 7.3 g/dL (ref 6.0–8.5)

## 2015-09-22 LAB — BASIC METABOLIC PANEL
BUN/Creatinine Ratio: 19 (ref 9–23)
BUN: 13 mg/dL (ref 6–20)
CALCIUM: 9.8 mg/dL (ref 8.7–10.2)
CO2: 25 mmol/L (ref 18–29)
Chloride: 98 mmol/L (ref 96–106)
Creatinine, Ser: 0.69 mg/dL (ref 0.57–1.00)
GFR, EST AFRICAN AMERICAN: 139 mL/min/{1.73_m2} (ref >59)
GFR, EST NON AFRICAN AMERICAN: 121 mL/min/{1.73_m2} (ref >59)
Glucose: 55 mg/dL — ABNORMAL LOW (ref 65–99)
POTASSIUM: 4.2 mmol/L (ref 3.5–5.2)
Sodium: 139 mmol/L (ref 134–144)

## 2015-09-22 LAB — HEMOGLOBIN A1C
Est. average glucose Bld gHb Est-mCnc: 103 mg/dL
HEMOGLOBIN A1C: 5.2 % (ref 4.8–5.6)

## 2015-09-27 NOTE — Addendum Note (Signed)
Addended by: Jeralene PetersREWS, Deandre Brannan R on: 09/27/2015 03:42 PM   Modules accepted: Orders

## 2015-09-29 ENCOUNTER — Telehealth: Payer: Self-pay | Admitting: Family Medicine

## 2015-09-29 NOTE — Telephone Encounter (Signed)
Mom called stating that the pt just failed another drug test and was told that her liver/kidney enzymes could be causing false positives. Mother is worried that the pt will fail another one and go to jail for 9 months. Mom wants to know if there is another test that can be done. Mom is positive that the pt is not on drugs. Please advise.

## 2015-10-01 NOTE — Telephone Encounter (Signed)
According to the medical literature that I am reading the test for cocaine looks for metabolite of cocaine. There can be rare false positives-that means the tests says a person is using when they are not-I would recommend that any tests be certain that it is being done through a facility that accurately tests. Plus also the doctor who orders the tests can talk with a toxicologist who works with the lab to see if there are any other signs that could indicate faults positive test. Although there are some Google articles where some people relate liver and kidney can cause false positive testing I am not aware of this phenomena. I would recommend that Theresa Shaw talk directly with the doctor who is doing her testing and ask that Dr. to talk with toxicology to see if there are other causes that could trigger a faults positive cocaine test.. Also see if that Dr. would be willing to do a blood test rather than urine to see if there is any difference in the results. Outside of this information I do not have additional suggestions at this time

## 2015-10-01 NOTE — Telephone Encounter (Signed)
Spoke with patient's mother and informed her per Dr.Scott Luking- According to the medical literature that I am reading the test for cocaine looks for metabolite of cocaine. There can be rare false positives-that means the tests says a person is using when they are not-I would recommend that any tests be certain that it is being done through a facility that accurately tests. Plus also the doctor who orders the tests can talk with a toxicologist who works with the lab to see if there are any other signs that could indicate faults positive test. Although there are some Google articles where some people relate liver and kidney can cause false positive testing I am not aware of this phenomena. I would recommend that [name] talk directly with the doctor who is doing her testing and ask that Dr. to talk with toxicology to see if there are other causes that could trigger a faults positive cocaine test.. Also see if that Dr. would be willing to do a blood test rather than urine to see if there is any difference in the results. Outside of this information I do not have additional suggestions at this time. Patient's mother verbalized understanding.

## 2015-12-07 ENCOUNTER — Encounter: Payer: Self-pay | Admitting: Advanced Practice Midwife

## 2015-12-07 ENCOUNTER — Ambulatory Visit (INDEPENDENT_AMBULATORY_CARE_PROVIDER_SITE_OTHER): Payer: Medicaid Other | Admitting: Advanced Practice Midwife

## 2015-12-07 ENCOUNTER — Other Ambulatory Visit (HOSPITAL_COMMUNITY)
Admission: RE | Admit: 2015-12-07 | Discharge: 2015-12-07 | Disposition: A | Discharge status: Home / Self Care | Payer: Medicaid Other | Source: Ambulatory Visit | Attending: Advanced Practice Midwife | Admitting: Advanced Practice Midwife

## 2015-12-07 VITALS — BP 120/70 | HR 114 | Ht 64.0 in | Wt 180.0 lb

## 2015-12-07 DIAGNOSIS — Z124 Encounter for screening for malignant neoplasm of cervix: Secondary | ICD-10-CM

## 2015-12-07 DIAGNOSIS — Z0000 Encounter for general adult medical examination without abnormal findings: Secondary | ICD-10-CM | POA: Diagnosis not present

## 2015-12-07 DIAGNOSIS — R319 Hematuria, unspecified: Secondary | ICD-10-CM

## 2015-12-07 DIAGNOSIS — Z1389 Encounter for screening for other disorder: Secondary | ICD-10-CM

## 2015-12-07 DIAGNOSIS — Z113 Encounter for screening for infections with a predominantly sexual mode of transmission: Secondary | ICD-10-CM | POA: Diagnosis present

## 2015-12-07 DIAGNOSIS — F1120 Opioid dependence, uncomplicated: Secondary | ICD-10-CM

## 2015-12-07 DIAGNOSIS — Z01411 Encounter for gynecological examination (general) (routine) with abnormal findings: Secondary | ICD-10-CM | POA: Insufficient documentation

## 2015-12-07 DIAGNOSIS — Z1151 Encounter for screening for human papillomavirus (HPV): Secondary | ICD-10-CM | POA: Diagnosis present

## 2015-12-07 LAB — POCT URINALYSIS DIPSTICK
GLUCOSE UA: NEGATIVE
Ketones, UA: NEGATIVE
LEUKOCYTES UA: NEGATIVE
Nitrite, UA: NEGATIVE
PROTEIN UA: NEGATIVE
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
## 2015-12-08 LAB — HEPATIC FUNCTION PANEL
ALT: 126 IU/L — AB (ref 0–32)
AST: 63 IU/L — AB (ref 0–40)
Albumin: 4.4 g/dL (ref 3.5–5.5)
Alkaline Phosphatase: 70 IU/L (ref 39–117)
Bilirubin Total: 0.3 mg/dL (ref 0.0–1.2)
Bilirubin, Direct: 0.12 mg/dL (ref 0.00–0.40)
Total Protein: 7 g/dL (ref 6.0–8.5)

## 2015-12-09 LAB — CYTOLOGY - PAP

## 2015-12-09 LAB — URINE CULTURE

## 2015-12-15 ENCOUNTER — Encounter: Payer: Self-pay | Admitting: Advanced Practice Midwife

## 2015-12-20 ENCOUNTER — Telehealth: Payer: Self-pay | Admitting: Advanced Practice Midwife

## 2015-12-20 NOTE — Addendum Note (Signed)
Addended by: Jeralene Peters on: 12/20/2015 03:52 PM   Modules accepted: Orders

## 2015-12-27 ENCOUNTER — Encounter: Payer: Self-pay | Admitting: *Deleted

## 2015-12-27 ENCOUNTER — Encounter: Payer: Self-pay | Admitting: Family Medicine

## 2015-12-27 ENCOUNTER — Telehealth: Payer: Self-pay | Admitting: Advanced Practice Midwife

## 2015-12-27 NOTE — Telephone Encounter (Signed)
Pt was calling to schedule colposcopy, call transferred to front staff for appointment to be scheduled.

## 2015-12-27 NOTE — Telephone Encounter (Signed)
Pt called stating that she was referred to get a Colonoscopy and would like to speak with fran or her nurse. Please contact pt

## 2015-12-27 NOTE — Telephone Encounter (Signed)
Pt called back on 12/27/15 to schedule colposcopy.

## 2016-01-03 ENCOUNTER — Emergency Department (HOSPITAL_COMMUNITY)
Admission: EM | Admit: 2016-01-03 | Discharge: 2016-01-03 | Disposition: A | Discharge status: Home / Self Care | Payer: Medicaid Other | Attending: Emergency Medicine | Admitting: Emergency Medicine

## 2016-01-03 ENCOUNTER — Encounter (HOSPITAL_COMMUNITY): Payer: Self-pay | Admitting: Emergency Medicine

## 2016-01-03 ENCOUNTER — Encounter: Payer: Self-pay | Admitting: Gastroenterology

## 2016-01-03 DIAGNOSIS — Z79899 Other long term (current) drug therapy: Secondary | ICD-10-CM | POA: Insufficient documentation

## 2016-01-03 DIAGNOSIS — F17210 Nicotine dependence, cigarettes, uncomplicated: Secondary | ICD-10-CM | POA: Diagnosis not present

## 2016-01-03 DIAGNOSIS — H9201 Otalgia, right ear: Secondary | ICD-10-CM | POA: Diagnosis present

## 2016-01-03 DIAGNOSIS — H66001 Acute suppurative otitis media without spontaneous rupture of ear drum, right ear: Secondary | ICD-10-CM | POA: Insufficient documentation

## 2016-01-03 DIAGNOSIS — R05 Cough: Secondary | ICD-10-CM | POA: Diagnosis not present

## 2016-01-03 MED ORDER — AMOXICILLIN-POT CLAVULANATE 875-125 MG PO TABS: 1.0000 | ORAL_TABLET | Freq: Two times a day (BID) | ORAL | 0 refills | Status: DC

## 2016-01-03 MED ORDER — TRAMADOL HCL 50 MG PO TABS: 50.0000 mg | ORAL_TABLET | Freq: Four times a day (QID) | ORAL | 0 refills | Status: DC | PRN

## 2016-01-03 MED ORDER — NEOMYCIN-POLYMYXIN-HC 3.5-10000-1 OT SUSP: 4.0000 [drp] | Freq: Three times a day (TID) | OTIC | 0 refills | Status: DC

## 2016-01-03 NOTE — Discharge Instructions (Signed)
Follow-up with your primary doctor or return here if symptoms are not improving

## 2016-01-03 NOTE — ED Triage Notes (Signed)
Pt reports right ear pain x1 week. Pt reports intense pain began yesterday with minimal yellow drainage noted from site. Pt denies any known injury. Pt reports headache x1 week as well.
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
## 2016-01-06 ENCOUNTER — Ambulatory Visit (INDEPENDENT_AMBULATORY_CARE_PROVIDER_SITE_OTHER): Payer: Medicaid Other | Admitting: Obstetrics & Gynecology

## 2016-01-06 ENCOUNTER — Encounter: Payer: Self-pay | Admitting: Obstetrics & Gynecology

## 2016-01-06 VITALS — BP 108/80 | HR 96 | Ht 64.0 in | Wt 180.0 lb

## 2016-01-06 DIAGNOSIS — R87810 Cervical high risk human papillomavirus (HPV) DNA test positive: Secondary | ICD-10-CM

## 2016-01-06 DIAGNOSIS — R896 Abnormal cytological findings in specimens from other organs, systems and tissues: Secondary | ICD-10-CM

## 2016-01-06 DIAGNOSIS — IMO0002 Reserved for concepts with insufficient information to code with codable children: Secondary | ICD-10-CM

## 2016-01-06 DIAGNOSIS — R87610 Atypical squamous cells of undetermined significance on cytologic smear of cervix (ASC-US): Secondary | ICD-10-CM | POA: Diagnosis not present

## 2016-01-06 DIAGNOSIS — Z3202 Encounter for pregnancy test, result negative: Secondary | ICD-10-CM

## 2016-01-06 LAB — POCT URINE PREGNANCY: Preg Test, Ur: NEGATIVE

## 2016-01-06 NOTE — Progress Notes (Signed)
Colposcopy Procedure Note:  Colposcopy Procedure Note  Indications: Pap smear 1 months ago showed: ASCUS with POSITIVE high risk HPV. The prior pap showed no abnormalities.  Prior cervical/vaginal disease: normal exam without visible pathology. Prior cervical treatment: no treatment.  Smoker:  Yes.   New sexual partner:  No.  : time frame:    History of abnormal Pap: yes  Procedure Details  The risks and benefits of the procedure and Written informed consent obtained.  Speculum placed in vagina and excellent visualization of cervix achieved, cervix swabbed x 3 with acetic acid solution.  Findings: Cervix: no visible lesions, no mosaicism, no punctation and no abnormal vasculature; no biopsies taken. Vaginal inspection: vaginal colposcopy not performed. Vulvar colposcopy: vulvar colposcopy not performed.  Specimens: none  Complications: none.  Plan: Repeat Pap 1 year

## 2016-01-14 ENCOUNTER — Ambulatory Visit (INDEPENDENT_AMBULATORY_CARE_PROVIDER_SITE_OTHER): Payer: Medicaid Other | Admitting: Family Medicine

## 2016-01-14 ENCOUNTER — Encounter: Payer: Self-pay | Admitting: Family Medicine

## 2016-01-14 VITALS — BP 112/64 | Temp 98.5°F | Ht 64.0 in | Wt 180.0 lb

## 2016-01-14 DIAGNOSIS — K649 Unspecified hemorrhoids: Secondary | ICD-10-CM

## 2016-01-14 DIAGNOSIS — G894 Chronic pain syndrome: Secondary | ICD-10-CM | POA: Diagnosis not present

## 2016-01-14 DIAGNOSIS — H9201 Otalgia, right ear: Secondary | ICD-10-CM | POA: Diagnosis not present

## 2016-01-14 DIAGNOSIS — R748 Abnormal levels of other serum enzymes: Secondary | ICD-10-CM | POA: Diagnosis not present

## 2016-01-14 DIAGNOSIS — R635 Abnormal weight gain: Secondary | ICD-10-CM | POA: Diagnosis not present

## 2016-01-14 MED ORDER — CEFPROZIL 250 MG PO TABS: 250.0000 mg | ORAL_TABLET | Freq: Two times a day (BID) | ORAL | 0 refills | Status: DC
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
## 2016-01-15 LAB — CBC WITH DIFFERENTIAL/PLATELET
BASOS ABS: 0 10*3/uL (ref 0.0–0.2)
BASOS: 0 %
EOS (ABSOLUTE): 0.3 10*3/uL (ref 0.0–0.4)
Eos: 3 %
Hematocrit: 42.8 % (ref 34.0–46.6)
Hemoglobin: 14.3 g/dL (ref 11.1–15.9)
IMMATURE GRANS (ABS): 0 10*3/uL (ref 0.0–0.1)
IMMATURE GRANULOCYTES: 0 %
LYMPHS: 16 %
Lymphocytes Absolute: 1.6 10*3/uL (ref 0.7–3.1)
MCH: 29.3 pg (ref 26.6–33.0)
MCHC: 33.4 g/dL (ref 31.5–35.7)
MCV: 88 fL (ref 79–97)
MONOS ABS: 0.6 10*3/uL (ref 0.1–0.9)
Monocytes: 7 %
NEUTROS PCT: 74 %
Neutrophils Absolute: 7.3 10*3/uL — ABNORMAL HIGH (ref 1.4–7.0)
PLATELETS: 251 10*3/uL (ref 150–379)
RBC: 4.88 x10E6/uL (ref 3.77–5.28)
RDW: 13.4 % (ref 12.3–15.4)
WBC: 9.8 10*3/uL (ref 3.4–10.8)

## 2016-01-15 LAB — FERRITIN: Ferritin: 63 ng/mL (ref 15–150)

## 2016-01-15 LAB — HEPATITIS B SURFACE ANTIGEN: HEP B S AG: NEGATIVE

## 2016-01-15 LAB — HEPATITIS C ANTIBODY: Hep C Virus Ab: >11 s/co ratio — ABNORMAL HIGH (ref 0.0–0.9)

## 2016-01-15 LAB — ANA: ANA: NEGATIVE

## 2016-01-15 LAB — SEDIMENTATION RATE: SED RATE: 5 mm/h (ref 0–32)

## 2016-01-15 LAB — CERULOPLASMIN: CERULOPLASMIN: 32.6 mg/dL (ref 19.0–39.0)

## 2016-01-15 LAB — TSH: TSH: 0.754 u[IU]/mL (ref 0.450–4.500)

## 2016-01-18 ENCOUNTER — Ambulatory Visit (HOSPITAL_COMMUNITY)
Admission: RE | Admit: 2016-01-18 | Discharge: 2016-01-18 | Disposition: A | Discharge status: Home / Self Care | Payer: Medicaid Other | Source: Ambulatory Visit | Attending: Family Medicine | Admitting: Family Medicine

## 2016-01-18 DIAGNOSIS — R635 Abnormal weight gain: Secondary | ICD-10-CM | POA: Insufficient documentation

## 2016-01-18 DIAGNOSIS — R748 Abnormal levels of other serum enzymes: Secondary | ICD-10-CM | POA: Insufficient documentation

## 2016-01-21 ENCOUNTER — Encounter: Payer: Self-pay | Admitting: Family Medicine

## 2016-01-28 ENCOUNTER — Ambulatory Visit (INDEPENDENT_AMBULATORY_CARE_PROVIDER_SITE_OTHER): Payer: Medicaid Other | Admitting: Gastroenterology

## 2016-01-28 ENCOUNTER — Encounter: Payer: Self-pay | Admitting: Gastroenterology

## 2016-01-28 VITALS — BP 124/79 | HR 110 | Temp 97.9°F | Ht 64.0 in

## 2016-01-28 DIAGNOSIS — R768 Other specified abnormal immunological findings in serum: Secondary | ICD-10-CM

## 2016-01-28 DIAGNOSIS — R1013 Epigastric pain: Secondary | ICD-10-CM

## 2016-01-28 DIAGNOSIS — R894 Abnormal immunological findings in specimens from other organs, systems and tissues: Secondary | ICD-10-CM | POA: Diagnosis not present

## 2016-01-28 MED ORDER — PROMETHAZINE HCL 25 MG PO TABS: 25.0000 mg | ORAL_TABLET | Freq: Four times a day (QID) | ORAL | 2 refills | Status: DC | PRN

## 2016-01-28 MED ORDER — PANTOPRAZOLE SODIUM 40 MG PO TBEC: 40.0000 mg | DELAYED_RELEASE_TABLET | Freq: Every day | ORAL | 3 refills | Status: DC
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
## 2016-01-28 NOTE — Patient Instructions (Signed)
Please have blood work done today.  We have also scheduled you for a special ultrasound to further evaluate your liver for insurance purposes.   Please have hepatitis A/B vaccinations done with Dr. Gerda DissLuking.   I have started you on Protonix to take once each morning, 30 minutes before breakfast daily. I have also sent in Phenergan to take as needed for nausea.   I will see you in 6 weeks. We will discuss treatment at that point. We may be able to submit for treatment prior to that point.

## 2016-02-04 ENCOUNTER — Telehealth: Payer: Self-pay | Admitting: Gastroenterology

## 2016-02-04 DIAGNOSIS — R1013 Epigastric pain: Secondary | ICD-10-CM | POA: Insufficient documentation

## 2016-02-04 DIAGNOSIS — R768 Other specified abnormal immunological findings in serum: Secondary | ICD-10-CM | POA: Insufficient documentation

## 2016-02-04 NOTE — Assessment & Plan Note (Signed)
Query atypical GERD symptoms. Start on Protonix once daily. Return in 6 weeks. No alarm symptoms.

## 2016-02-04 NOTE — Telephone Encounter (Signed)
I have ordered an HIV test and an additional Hep B test. I don't see where she has had her labs done yet. Can you let her know that I ordered 2 additional tests, so she can get this done at the same time. Thanks!

## 2016-02-04 NOTE — Assessment & Plan Note (Signed)
Treatment naive, unknown viral load or genotype. Needs HCV RNA with genotype reflex, elastography, further assessment of Hep B total antibody. Will also need vaccinations. HIV negative 3 years ago but will recheck this to be thorough. She has been clean from any IV drug use since 2015 and no past or current use of ETOH. I feel she is motivated to pursue therapy. Labs ordered and elastography today. Return in 6 weeks for follow-up.

## 2016-02-07 NOTE — Telephone Encounter (Signed)
Contacted patient and informed that I just needed additional blood work in order to rule out any co-existing underlying infection. She stated she understood and felt much better about this.

## 2016-02-07 NOTE — Progress Notes (Signed)
CC'ED TO PCP 

## 2016-02-07 NOTE — Telephone Encounter (Signed)
Pt called and was informed.  Said she was recently started on Suvoxone.  She also is requesting a call from Tobi Bastosnna to discuss the lab tests ordered.

## 2016-02-07 NOTE — Telephone Encounter (Signed)
Tried to call and VM not set up. Mailing a letter about the additional labs.

## 2016-02-11 LAB — HIV ANTIBODY (ROUTINE TESTING W REFLEX): HIV: NONREACTIVE

## 2016-02-11 LAB — HEPATITIS B CORE ANTIBODY, TOTAL: HEP B C TOTAL AB: NONREACTIVE

## 2016-02-17 ENCOUNTER — Telehealth: Payer: Self-pay | Admitting: Family Medicine

## 2016-02-17 NOTE — Telephone Encounter (Signed)
The patient recently saw gastroenterology. They recommend that she have hepatitis A and hepatitis B vaccinations due to her having hepatitis C. They had told her to get these done through our office. Because of her age I recommend referring her to the health department for these. Least talk with the patient let her know that we received communication from the gastroenterologist recommending these. Let the patient know that hepatitis A and hepatitis B vaccine series is through the health department at her age thank you

## 2016-02-18 NOTE — Telephone Encounter (Signed)
Spoke with the patient and informed her per Dr.Scott Luking- Dr.Scott received information from your gastroenterologist in regards to them recommending hepatitis A and B vaccines due to you having hepatitis C. They recommend our office for vaccines but due to your age we recommend you going to the health department for these vaccines. Patient verbalized understanding.

## 2016-02-26 NOTE — Progress Notes (Signed)
HIV negative. Hep B completed. She still hasn't done the HCV labs? Or INR? Need this and elastography before we submit to insurance.

## 2016-02-28 NOTE — Progress Notes (Signed)
Tried to call ad Vm not set up. Mailing a letter to call.

## 2016-03-06 ENCOUNTER — Telehealth: Payer: Self-pay
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
## 2016-03-08 NOTE — Telephone Encounter (Signed)
I'm so sorry to hear about her break-up. I understand it will be difficult to get around. No rush, just wanted to let her know I couldn't move forward without that information.

## 2016-03-13 ENCOUNTER — Encounter: Payer: Self-pay | Admitting: Gastroenterology

## 2016-03-13 ENCOUNTER — Ambulatory Visit: Payer: Self-pay | Admitting: Gastroenterology

## 2016-03-13 ENCOUNTER — Telehealth: Payer: Self-pay | Admitting: Gastroenterology

## 2016-03-13 NOTE — Telephone Encounter (Signed)
PT WAS A NO SHOW AND LETTER SENT  °

## 2016-08-14 ENCOUNTER — Encounter: Payer: Self-pay | Admitting: Gastroenterology

## 2016-08-18 ENCOUNTER — Emergency Department (HOSPITAL_COMMUNITY)
Admission: EM | Admit: 2016-08-18 | Discharge: 2016-08-18 | Disposition: A | Discharge status: Home / Self Care | Payer: Medicaid Other | Attending: Emergency Medicine | Admitting: Emergency Medicine

## 2016-08-18 ENCOUNTER — Encounter (HOSPITAL_COMMUNITY): Payer: Self-pay | Admitting: Emergency Medicine

## 2016-08-18 DIAGNOSIS — Z79899 Other long term (current) drug therapy: Secondary | ICD-10-CM | POA: Insufficient documentation

## 2016-08-18 DIAGNOSIS — J340 Abscess, furuncle and carbuncle of nose: Secondary | ICD-10-CM | POA: Diagnosis not present

## 2016-08-18 DIAGNOSIS — F17210 Nicotine dependence, cigarettes, uncomplicated: Secondary | ICD-10-CM | POA: Insufficient documentation

## 2016-08-18 DIAGNOSIS — L02411 Cutaneous abscess of right axilla: Secondary | ICD-10-CM | POA: Diagnosis not present

## 2016-08-18 DIAGNOSIS — L0231 Cutaneous abscess of buttock: Secondary | ICD-10-CM | POA: Insufficient documentation

## 2016-08-18 DIAGNOSIS — L0291 Cutaneous abscess, unspecified: Secondary | ICD-10-CM

## 2016-08-18 MED ORDER — MUPIROCIN 2 % EX OINT
TOPICAL_OINTMENT | Freq: Once | CUTANEOUS | Status: AC
  Administered 2016-08-18: 1 via NASAL
  Filled 2016-08-18: qty 22

## 2016-08-18 MED ORDER — KETOROLAC TROMETHAMINE 10 MG PO TABS
10.0000 mg | ORAL_TABLET | Freq: Once | ORAL | Status: AC
  Administered 2016-08-18: 10 mg via ORAL
  Filled 2016-08-18: qty 1

## 2016-08-18 MED ORDER — PROCHLORPERAZINE EDISYLATE 5 MG/ML IJ SOLN
5.0000 mg | Freq: Once | INTRAMUSCULAR | Status: AC
Start: 2016-08-18 — End: 2016-08-18
  Administered 2016-08-18: 5 mg via INTRAVENOUS
  Filled 2016-08-18: qty 2

## 2016-08-18 MED ORDER — DOXYCYCLINE HYCLATE 100 MG PO CAPS: 100.0000 mg | ORAL_CAPSULE | Freq: Two times a day (BID) | ORAL | 0 refills | Status: DC

## 2016-08-18 MED ORDER — CLINDAMYCIN PHOSPHATE 600 MG/50ML IV SOLN
600.0000 mg | Freq: Once | INTRAVENOUS | Status: AC
  Administered 2016-08-18: 600 mg via INTRAVENOUS
  Filled 2016-08-18: qty 50

## 2016-08-18 MED ORDER — MUPIROCIN 2 % EX OINT
TOPICAL_OINTMENT | Freq: Two times a day (BID) | CUTANEOUS | Status: DC
  Filled 2016-08-18: qty 22

## 2016-08-18 MED ORDER — HYDROCODONE-ACETAMINOPHEN 5-325 MG PO TABS
2.0000 | ORAL_TABLET | Freq: Once | ORAL | Status: DC
  Filled 2016-08-18: qty 2

## 2016-08-18 MED ORDER — MUPIROCIN CALCIUM 2 % NA OINT: TOPICAL_OINTMENT | NASAL | 1 refills | Status: DC

## 2016-08-18 MED ORDER — MUPIROCIN 2 % EX OINT
TOPICAL_OINTMENT | CUTANEOUS | Status: AC
  Filled 2016-08-18: qty 22

## 2016-08-18 NOTE — ED Triage Notes (Signed)
Boil in left nostril x 2 months getting worse. Boil under arm and rash on buttocks today. nad.

## 2016-08-18 NOTE — ED Notes (Signed)
Pt made aware to return if symptoms worsen or if any life threatening symptoms occur.   

## 2016-08-18 NOTE — Discharge Instructions (Signed)
Please wash hands frequently. Please apply Bactroban to the abscess area of your nose 2 times daily until healed. Use doxycycline 2 times daily with food until all taken. You were treated with intravenous clindamycin here in the emergency department. Please soak in all total warm Epsom salt water for 10-15 minutes 2 times daily. It is important that you engage the right under arm and the right buttocks. Please see Dr.Luking next week for follow-up and recheck of the abscess areas. Please return to the emergency department if you develop a fever that would not resolve with Tylenol or ibuprofen, you develop repeated nausea/vomiting, or you notice deterioration in your general condition.
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
## 2016-08-21 LAB — AEROBIC CULTURE W GRAM STAIN (SUPERFICIAL SPECIMEN)

## 2016-08-22 ENCOUNTER — Telehealth: Payer: Self-pay | Admitting: *Deleted

## 2016-08-22 NOTE — Telephone Encounter (Signed)
Post ED Visit - Positive Culture Follow-up  Culture report reviewed by antimicrobial stewardship pharmacist:  []  Enzo BiNathan Batchelder, Pharm.D. []  Celedonio MiyamotoJeremy Frens, Pharm.D., BCPS AQ-ID []  Garvin FilaMike Maccia, Pharm.D., BCPS [x]  Georgina PillionElizabeth Martin, Pharm.D., BCPS []  McCrackenMinh Pham, VermontPharm.D., BCPS, AAHIVP []  Estella HuskMichelle Turner, Pharm.D., BCPS, AAHIVP []  Lysle Pearlachel Rumbarger, PharmD, BCPS []  Casilda Carlsaylor Stone, PharmD, BCPS []  Pollyann SamplesAndy Johnston, PharmD, BCPS  Positive wound culture Treated with Doxycycline Hyclate, organism sensitive to the same and no further patient follow-up is required at this time.  Virl AxeRobertson, Edgar Corrigan [facility] 08/22/2016, 12:51 PM

## 2016-08-30 ENCOUNTER — Ambulatory Visit: Payer: Medicaid Other | Admitting: Family Medicine

## 2016-08-30 ENCOUNTER — Encounter: Payer: Self-pay | Admitting: Family Medicine

## 2016-08-30 ENCOUNTER — Ambulatory Visit (INDEPENDENT_AMBULATORY_CARE_PROVIDER_SITE_OTHER): Payer: Medicaid Other | Admitting: Family Medicine

## 2016-08-30 VITALS — Ht 64.0 in | Wt 180.0 lb

## 2016-08-30 DIAGNOSIS — Z3201 Encounter for pregnancy test, result positive: Secondary | ICD-10-CM

## 2016-08-30 DIAGNOSIS — Z3490 Encounter for supervision of normal pregnancy, unspecified, unspecified trimester: Secondary | ICD-10-CM

## 2016-08-30 DIAGNOSIS — N912 Amenorrhea, unspecified: Secondary | ICD-10-CM | POA: Diagnosis not present

## 2016-08-30 LAB — POCT URINE PREGNANCY: PREG TEST UR: POSITIVE — AB

## 2016-08-30 NOTE — Progress Notes (Signed)
Urgent referral ordered in EPIC. 

## 2016-08-30 NOTE — Addendum Note (Signed)
Addended by: Margaretha Sheffield on: 08/30/2016 01:32 PM   Modules accepted: Orders

## 2016-08-30 NOTE — Progress Notes (Signed)
   Subjective:    Patient ID: Theresa Shaw, female    DOB: 1990/08/11, 26 y.o.   MRN: [id number]  HPI Patient arrives with c/o sore on her right side, Patient states her insurance and an clinic needs to verify she is not pregnant due to lack of menstruation. She relates that she had an infection underneath the right side of her arm that is now doing much better she also states that she missed her cycle but did bleed one day over the weekend denies any severe abdominal pain sweats chills vomiting denies any bleeding currently she has been sexually active does not use protection  Review of Systems Please see above    Objective:   Physical Exam Lungs clear heart regular  Urine pregnancy test positive  Patient requested a letter stating that she was pregnant that she would be able to send to her pain management clinic this was written    patient was warned that if she starts having severe abdominal pain or bleeding issues immediately go to ER she states she would go see gynecology if we set them up on Friday morning she did not have time today or tomorrow to do this because of lack of transportation and patient does not one to use public transportation Assessment & Plan:  Patient intends to get an abortion. I did recommend quantitative beta hCG because of having one day of bleeding over the weekend I did recommend gynecology referral for ultrasound I doubt ectopic pregnancy because she's not having pain but I do feel it would be best for her to see gynecology within the next couple days

## 2016-08-31 ENCOUNTER — Telehealth: Payer: Self-pay | Admitting: Family Medicine

## 2016-08-31 NOTE — Telephone Encounter (Signed)
FYI - Please see notes below for attempts to reach pt for urgent gyn referral       General 08/31/2016 4:17 PM Noreene Larsson, RMA - - Pt called back and left a voicemail on our machine at [facility] stating that she is not interested in seeing [place] because she knows what is going on with her body and she is having a miscarriage.  08-31-16  AS    General 08/31/2016 2:24 PM Enid Derry E Todd - -  Left message on mom's voicemail, asking for pt to call me to give info on a referral & that we've been unable to reach pt due to voicemail is full     General 08/31/2016 8:33 AM Noreene Larsson, RMA - - Unable to reach pt to schedule appointment.  Pt's voicemail was full.  08-31-16  AS

## 2016-09-03 NOTE — Telephone Encounter (Signed)
Please attempt to call the patient let her know it is in her best interest to follow-up with OB/GYN. If she does not respond to this phone call please let me know and I will send a certified letter thank you

## 2016-09-05 ENCOUNTER — Ambulatory Visit: Payer: Medicaid Other | Admitting: Gastroenterology

## 2016-09-06 NOTE — Telephone Encounter (Signed)
Pt returned my call States that her boyfriend's father had a stroke recently and she's been really busy with that She states she has RH negative blood & has miscarried before and is not really worried States she took a pregnancy test today from the [employer] that was negative States she will call OB/GYN to get in as soon as she can if Dr. Lorin Picket feels it's important

## 2016-09-06 NOTE — Telephone Encounter (Signed)
LMOVM - explained to pt that Dr. Lorin Picket feels strongly that it is in her best interest to follow up with OB/GYN, to please call & let me know if she plans to see OB/GYN so that I may let Dr. Lorin Picket know

## 2016-09-06 NOTE — Telephone Encounter (Signed)
I believe it is important for the patient to be seen by OB/GYN. She should call them and schedule to follow-up on this issue. Please document that you've told her to do so

## 2016-09-07 NOTE — Telephone Encounter (Signed)
Left message to return call 

## 2016-09-14 ENCOUNTER — Telehealth: Payer: Self-pay | Admitting: Family Medicine

## 2016-09-14 NOTE — Telephone Encounter (Signed)
Please see form from [employer] regarding patient's urinary catheter supplies. Original form that was completed had missing answers to question 21-22 so new form was faxed. Please sign. Form in yellow folder in Dr.Scott's office

## 2016-09-14 NOTE — Telephone Encounter (Signed)
This form was completed thank you 

## 2016-09-15 ENCOUNTER — Encounter: Payer: Self-pay | Admitting: Family Medicine

## 2016-09-19 ENCOUNTER — Ambulatory Visit: Payer: Medicaid Other | Admitting: Gastroenterology

## 2016-09-19 ENCOUNTER — Encounter: Payer: Self-pay | Admitting: Gastroenterology

## 2016-09-19 ENCOUNTER — Telehealth: Payer: Self-pay | Admitting: Gastroenterology

## 2016-09-19 NOTE — Telephone Encounter (Signed)
Letter was sent to the patient regarding the importance of being seen

## 2016-09-19 NOTE — Telephone Encounter (Signed)
Pt has not returned call.

## 2016-09-19 NOTE — Telephone Encounter (Signed)
PATIENT WAS A NO SHOW AND LETTER SENT  °

## 2017-01-01 ENCOUNTER — Other Ambulatory Visit: Payer: Medicaid Other

## 2017-01-01 ENCOUNTER — Encounter: Payer: Self-pay | Admitting: *Deleted

## 2017-01-01 ENCOUNTER — Other Ambulatory Visit (INDEPENDENT_AMBULATORY_CARE_PROVIDER_SITE_OTHER): Payer: Medicaid Other | Admitting: Women's Health

## 2017-01-23 ENCOUNTER — Ambulatory Visit (INDEPENDENT_AMBULATORY_CARE_PROVIDER_SITE_OTHER): Payer: Medicaid Other | Admitting: Gastroenterology

## 2017-01-23 ENCOUNTER — Encounter: Payer: Self-pay | Admitting: Gastroenterology

## 2017-01-23 VITALS — BP 132/76 | HR 106 | Temp 97.0°F | Ht 64.0 in

## 2017-01-23 DIAGNOSIS — K5900 Constipation, unspecified: Secondary | ICD-10-CM

## 2017-01-23 DIAGNOSIS — R768 Other specified abnormal immunological findings in serum: Secondary | ICD-10-CM | POA: Diagnosis not present

## 2017-01-23 NOTE — Patient Instructions (Addendum)
Please have blood work done today. We have also scheduled an ultrasound in the future.   Continue Movantik each day for now.   Once we review all the labs, we can submit for treatment!
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
## 2017-01-25 ENCOUNTER — Encounter: Payer: Self-pay | Admitting: Gastroenterology

## 2017-01-25 ENCOUNTER — Telehealth: Payer: Self-pay | Admitting: Gastroenterology

## 2017-01-25 NOTE — Progress Notes (Signed)
cc'ed to pcp °

## 2017-01-25 NOTE — Assessment & Plan Note (Addendum)
26 year old treatment naive, unknown viral load or genotype. Hep B surface antigen negative, HIV negative. I note she had a positive pregnancy urine test in April 2018, which she states she was not aware. She states she had negative tests at home. I see where PCP reached out to her several times and stressed importance of follow-up and sent letter as well. I discussed that we needed quantitative pregnancy test today, and I am referring her to GYN as well. If she is not pregnant, she needs contraception and desires IUD. Will do routine labs today and schedule for elastography while waiting on further pregnancy tests and appt with GYN. No medications were prescribed today or any changes in therapy from a GI perspective due to unknown pregnancy status. She notes low-volume hematochezia in the setting of constipation. She will need colonoscopy at some point but will need to document negative pregnancy tests and have contraception in place prior to proceeding with further interventions from a GI standpoint.

## 2017-01-25 NOTE — Telephone Encounter (Signed)
Tried to call pt. Mailbox full and could not leave a message. Letter mailed for her to call for plan of care.

## 2017-01-25 NOTE — Telephone Encounter (Signed)
Please make contact with patient and advise the utmost importance of getting labs done. She had positive urine pregnancy test in April 2018 and has not followed up from this. She is on multiple medications that would likely need to be adjusted by GYN (possibly). I did not change anything with medications when I saw her.   1. Please reach out to patient to have labs done (pregnancy, etc) 2. Refer to Theresa MourningJennifer Griffin, NP with GYN ASAP. If she is not pregnant, needs to discuss contraception prior to any consideration for Hep C treatment. She was pregnant in 2016 as well and seen by GYN.

## 2017-01-30 NOTE — Patient Instructions (Signed)
PA#  [id number] for [place]

## 2017-01-31 ENCOUNTER — Ambulatory Visit (HOSPITAL_COMMUNITY): Admission: RE | Admit: 2017-01-31 | Payer: Medicaid Other | Source: Ambulatory Visit

## 2017-02-05 NOTE — Telephone Encounter (Signed)
Spoke with patient. She has had 2 deaths in the family. Going to lab tomorrow. Ultrasound scheduled in near future.

## 2017-02-13 ENCOUNTER — Ambulatory Visit (HOSPITAL_COMMUNITY)
Admission: RE | Admit: 2017-02-13 | Discharge: 2017-02-13 | Disposition: A | Discharge status: Home / Self Care | Payer: Medicaid Other | Source: Ambulatory Visit | Attending: Gastroenterology | Admitting: Gastroenterology

## 2017-02-13 DIAGNOSIS — R768 Other specified abnormal immunological findings in serum: Secondary | ICD-10-CM | POA: Diagnosis present

## 2017-02-14 NOTE — Telephone Encounter (Signed)
Pt is calling to see about the results of her [place]. Please advise

## 2017-02-15 NOTE — Telephone Encounter (Signed)
Liver with fibrosis, some F3/F4 but I would not call this cirrhosis in this situation. Elastography can be "overcall" especially in setting of chronic Hep C. We will follow this due to concern for advanced fibrosis.  SHE NEEDS TO DO THE LABS ASAP. She had a positive pregnancy test in April WITHOUT ANY FOLLOW-UP. We can't move forward without having the labs done. It is IMPERATIVE.

## 2017-02-15 NOTE — Telephone Encounter (Signed)
Tried to call. VM not set up. Mailing a letter to call for results and to do labs ASAP.

## 2017-02-19 ENCOUNTER — Other Ambulatory Visit: Payer: Medicaid Other | Admitting: Adult Health

## 2017-02-20 NOTE — Progress Notes (Signed)
For some reason, I thought we had ordered a Hep B surface antibody. It may be too late to add this on.  pregnancy test negative. Liver numbers normal. Awaiting final Hep C quantitative with genotype, which is still pending. Metavir score F3/F4. She will need Hep A and Hep B vaccination. We can address that in clinic. For now, awaiting Hep C quantitative, then we can submit hopefully.

## 2017-02-20 NOTE — Progress Notes (Signed)
I called [facility] customer service and spoke to [place] and she has added the Hep B surface antibody.

## 2017-02-21 ENCOUNTER — Other Ambulatory Visit: Payer: Medicaid Other | Admitting: Women's Health

## 2017-02-21 LAB — HEPATITIS B SURFACE ANTIBODY,QUALITATIVE: HEP B S AB: BORDERLINE — AB

## 2017-02-21 LAB — COMPLETE METABOLIC PANEL WITH GFR
AG Ratio: 1.7 (calc) (ref 1.0–2.5)
ALKALINE PHOSPHATASE (APISO): 59 U/L (ref 33–115)
ALT: 11 U/L (ref 6–29)
AST: 13 U/L (ref 10–30)
Albumin: 4.2 g/dL (ref 3.6–5.1)
BILIRUBIN TOTAL: 0.3 mg/dL (ref 0.2–1.2)
BUN: 12 mg/dL (ref 7–25)
CHLORIDE: 109 mmol/L (ref 98–110)
CO2: 25 mmol/L (ref 20–32)
Calcium: 9 mg/dL (ref 8.6–10.2)
Creat: 0.75 mg/dL (ref 0.50–1.10)
GFR, Est African American: 126 mL/min/{1.73_m2} (ref >=60)
GFR, Est Non African American: 108 mL/min/{1.73_m2} (ref >=60)
GLUCOSE: 92 mg/dL (ref 65–139)
Globulin: 2.5 g/dL (calc) (ref 1.9–3.7)
Potassium: 4.2 mmol/L (ref 3.5–5.3)
SODIUM: 139 mmol/L (ref 135–146)
Total Protein: 6.7 g/dL (ref 6.1–8.1)

## 2017-02-21 LAB — CBC WITH DIFFERENTIAL/PLATELET
BASOS PCT: 0.5 %
Basophils Absolute: 40 cells/uL (ref 0–200)
EOS PCT: 6.7 %
Eosinophils Absolute: 536 cells/uL — ABNORMAL HIGH (ref 15–500)
HCT: 38.2 % (ref 35.0–45.0)
Hemoglobin: 12.8 g/dL (ref 11.7–15.5)
Lymphs Abs: 2032 cells/uL (ref 850–3900)
MCH: 29 pg (ref 27.0–33.0)
MCHC: 33.5 g/dL (ref 32.0–36.0)
MCV: 86.4 fL (ref 80.0–100.0)
MPV: 11.6 fL (ref 7.5–12.5)
Monocytes Relative: 6.6 %
NEUTROS PCT: 60.8 %
Neutro Abs: 4864 cells/uL (ref 1500–7800)
Platelets: 221 10*3/uL (ref 140–400)
RBC: 4.42 10*6/uL (ref 3.80–5.10)
RDW: 12.2 % (ref 11.0–15.0)
TOTAL LYMPHOCYTE: 25.4 %
WBC mixed population: 528 cells/uL (ref 200–950)
WBC: 8 10*3/uL (ref 3.8–10.8)

## 2017-02-21 LAB — TEST AUTHORIZATION

## 2017-02-21 LAB — HCG, QUANTITATIVE, PREGNANCY: HCG, Total, QN: <2 m[IU]/mL

## 2017-02-21 LAB — PROTIME-INR
INR: 0.9
PROTHROMBIN TIME: 9.7 s (ref 9.0–11.5)

## 2017-02-21 LAB — HCV RNA,QN PCR RFLX GENO, LIPABAD: HCV RNA, PCR, QN (Log): <1.18 log IU/mL

## 2017-02-21 LAB — HEPATITIS A ANTIBODY, TOTAL: HEPATITIS A AB,TOTAL: NONREACTIVE

## 2017-02-22 NOTE — Telephone Encounter (Signed)
Pt called office, she has had her labs done. States she is not pregnant. Has OB/GYN appt 03/01/17. She has to get pap smear done before receiving IUD. Routing to AB.

## 2017-02-27 NOTE — Telephone Encounter (Signed)
Noted  

## 2017-02-27 NOTE — Telephone Encounter (Signed)
See result note. She has positive Hep C antibody but negative viral load. She cleared on her own. We can see her in follow-up for routine GI purposes.

## 2017-02-27 NOTE — Progress Notes (Signed)
Tried to call and VM not set up.  

## 2017-03-01 ENCOUNTER — Other Ambulatory Visit: Payer: Medicaid Other | Admitting: Adult Health

## 2017-03-01 NOTE — Progress Notes (Signed)
Letter mailed for pt to call.  

## 2017-03-04 ENCOUNTER — Encounter (HOSPITAL_COMMUNITY): Payer: Self-pay | Admitting: Emergency Medicine

## 2017-03-04 ENCOUNTER — Emergency Department (HOSPITAL_COMMUNITY)
Admission: EM | Admit: 2017-03-04 | Discharge: 2017-03-05 | Disposition: A | Discharge status: Home / Self Care | Payer: Medicaid Other | Attending: Emergency Medicine | Admitting: Emergency Medicine

## 2017-03-04 DIAGNOSIS — Z79899 Other long term (current) drug therapy: Secondary | ICD-10-CM | POA: Diagnosis not present

## 2017-03-04 DIAGNOSIS — R202 Paresthesia of skin: Secondary | ICD-10-CM | POA: Diagnosis not present

## 2017-03-04 DIAGNOSIS — F17210 Nicotine dependence, cigarettes, uncomplicated: Secondary | ICD-10-CM | POA: Insufficient documentation

## 2017-03-04 DIAGNOSIS — R42 Dizziness and giddiness: Secondary | ICD-10-CM | POA: Diagnosis not present

## 2017-03-04 DIAGNOSIS — R51 Headache: Secondary | ICD-10-CM | POA: Diagnosis not present

## 2017-03-04 DIAGNOSIS — R519 Headache, unspecified: Secondary | ICD-10-CM
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
## 2017-03-05 ENCOUNTER — Emergency Department (HOSPITAL_COMMUNITY): Payer: Medicaid Other

## 2017-03-05 MED ORDER — METOCLOPRAMIDE HCL 5 MG/ML IJ SOLN
10.0000 mg | Freq: Once | INTRAMUSCULAR | Status: DC
  Filled 2017-03-05: qty 2

## 2017-03-05 MED ORDER — KETOROLAC TROMETHAMINE 30 MG/ML IJ SOLN
30.0000 mg | Freq: Once | INTRAMUSCULAR | Status: DC
  Filled 2017-03-05: qty 1

## 2017-03-05 MED ORDER — DIPHENHYDRAMINE HCL 50 MG/ML IJ SOLN
25.0000 mg | Freq: Once | INTRAMUSCULAR | Status: DC
  Filled 2017-03-05: qty 1

## 2017-03-05 NOTE — ED Notes (Signed)
The pt does not want the iv and iv meds she only wants the c-t due to the pressure in her head

## 2017-03-05 NOTE — ED Notes (Signed)
Returned from ct 
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
## 2017-03-22 ENCOUNTER — Telehealth: Payer: Self-pay | Admitting: Gastroenterology

## 2017-03-22 NOTE — Telephone Encounter (Signed)
PLEASE CALL PATIENT, HAVING ABD PAIN AND NAUSEA

## 2017-03-22 NOTE — Telephone Encounter (Signed)
2nd attempt to call pt, no answer, VM not set up.

## 2017-03-22 NOTE — Telephone Encounter (Signed)
Called Pt, no Answer, no VM set up. Will call back

## 2017-03-26 NOTE — Telephone Encounter (Signed)
Tried calling pt again, phone number is disconnected. Closing note.
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
## 2017-04-11 ENCOUNTER — Encounter (HOSPITAL_COMMUNITY): Payer: Self-pay | Admitting: Emergency Medicine

## 2017-04-11 ENCOUNTER — Emergency Department (HOSPITAL_COMMUNITY)
Admission: EM | Admit: 2017-04-11 | Discharge: 2017-04-11 | Disposition: A | Discharge status: Home / Self Care | Payer: Medicaid Other | Attending: Emergency Medicine | Admitting: Emergency Medicine

## 2017-04-11 ENCOUNTER — Other Ambulatory Visit: Payer: Self-pay

## 2017-04-11 DIAGNOSIS — R1114 Bilious vomiting: Secondary | ICD-10-CM | POA: Diagnosis not present

## 2017-04-11 DIAGNOSIS — N390 Urinary tract infection, site not specified: Secondary | ICD-10-CM | POA: Diagnosis not present

## 2017-04-11 DIAGNOSIS — F17210 Nicotine dependence, cigarettes, uncomplicated: Secondary | ICD-10-CM | POA: Diagnosis not present

## 2017-04-11 DIAGNOSIS — Z79899 Other long term (current) drug therapy: Secondary | ICD-10-CM | POA: Diagnosis not present

## 2017-04-11 HISTORY — DX: Other chronic pain: G89.29

## 2017-04-11 LAB — URINALYSIS, ROUTINE W REFLEX MICROSCOPIC
BILIRUBIN URINE: NEGATIVE
GLUCOSE, UA: NEGATIVE mg/dL
KETONES UR: 20 mg/dL — AB
NITRITE: POSITIVE — AB
Protein, ur: NEGATIVE mg/dL
Specific Gravity, Urine: 1.013 (ref 1.005–1.030)
pH: 6 (ref 5.0–8.0)

## 2017-04-11 LAB — LIPASE, BLOOD: Lipase: 19 U/L (ref 11–51)

## 2017-04-11 LAB — CBC WITH DIFFERENTIAL/PLATELET
BASOS ABS: 0 10*3/uL (ref 0.0–0.1)
BASOS PCT: 0 %
Eosinophils Absolute: 0.1 10*3/uL (ref 0.0–0.7)
Eosinophils Relative: 1 %
HEMATOCRIT: 38.5 % (ref 36.0–46.0)
HEMOGLOBIN: 13.5 g/dL (ref 12.0–15.0)
LYMPHS PCT: 15 %
Lymphs Abs: 1.5 10*3/uL (ref 0.7–4.0)
MCH: 30.5 pg (ref 26.0–34.0)
MCHC: 35.1 g/dL (ref 30.0–36.0)
MCV: 87.1 fL (ref 78.0–100.0)
MONO ABS: 0.5 10*3/uL (ref 0.1–1.0)
MONOS PCT: 5 %
NEUTROS ABS: 7.9 10*3/uL — AB (ref 1.7–7.7)
NEUTROS PCT: 79 %
Platelets: 233 10*3/uL (ref 150–400)
RBC: 4.42 MIL/uL (ref 3.87–5.11)
RDW: 11.4 % — AB (ref 11.5–15.5)
WBC: 10.1 10*3/uL (ref 4.0–10.5)

## 2017-04-11 LAB — COMPREHENSIVE METABOLIC PANEL
ALT: 22 U/L (ref 14–54)
AST: 19 U/L (ref 15–41)
Albumin: 4.5 g/dL (ref 3.5–5.0)
Alkaline Phosphatase: 62 U/L (ref 38–126)
Anion gap: 8 (ref 5–15)
BILIRUBIN TOTAL: 0.5 mg/dL (ref 0.3–1.2)
BUN: 8 mg/dL (ref 6–20)
CHLORIDE: 102 mmol/L (ref 101–111)
CO2: 26 mmol/L (ref 22–32)
Calcium: 9.7 mg/dL (ref 8.9–10.3)
Creatinine, Ser: 0.52 mg/dL (ref 0.44–1.00)
GFR calc Af Amer: >60 mL/min (ref >60)
GLUCOSE: 111 mg/dL — AB (ref 65–99)
Potassium: 3.9 mmol/L (ref 3.5–5.1)
Sodium: 136 mmol/L (ref 135–145)
TOTAL PROTEIN: 8 g/dL (ref 6.5–8.1)

## 2017-04-11 LAB — POC URINE PREG, ED: PREG TEST UR: NEGATIVE

## 2017-04-11 MED ORDER — CEPHALEXIN 500 MG PO CAPS: 500.0000 mg | ORAL_CAPSULE | Freq: Four times a day (QID) | ORAL | 0 refills | Status: DC

## 2017-04-11 MED ORDER — PROMETHAZINE HCL 25 MG PO TABS: 25.0000 mg | ORAL_TABLET | Freq: Four times a day (QID) | ORAL | 0 refills | Status: DC | PRN

## 2017-04-11 MED ORDER — CEPHALEXIN 500 MG PO CAPS
500.0000 mg | ORAL_CAPSULE | Freq: Once | ORAL | Status: AC
  Administered 2017-04-11: 500 mg via ORAL
  Filled 2017-04-11: qty 1

## 2017-04-11 MED ORDER — SODIUM CHLORIDE 0.9 % IV BOLUS (SEPSIS)
1000.0000 mL | Freq: Once | INTRAVENOUS | Status: AC
  Administered 2017-04-11: 1000 mL via INTRAVENOUS

## 2017-04-11 MED ORDER — ONDANSETRON HCL 4 MG/2ML IJ SOLN
4.0000 mg | Freq: Once | INTRAMUSCULAR | Status: AC
  Administered 2017-04-11: 4 mg via INTRAVENOUS
  Filled 2017-04-11: qty 2
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
## 2017-04-11 NOTE — Discharge Instructions (Signed)
Small frequent sips of fluids.  Bland diet as tolerated.  Take the antibiotic as directed until its finished.  Follow-up with your doctor for recheck, return here for any worsening symptoms

## 2017-04-13 LAB — URINE CULTURE: Culture: >=100000 — AB

## 2017-04-14 ENCOUNTER — Telehealth: Payer: Self-pay

## 2017-04-14 NOTE — Telephone Encounter (Signed)
Post ED Visit - Positive Culture Follow-up  Culture report reviewed by antimicrobial stewardship pharmacist:  []  Enzo BiNathan Batchelder, Pharm.D. []  Celedonio MiyamotoJeremy Frens, Pharm.D., BCPS AQ-ID []  Garvin FilaMike Maccia, Pharm.D., BCPS []  Georgina PillionElizabeth Martin, [address]Ph[address]D., BCPS []  Country Club HeightsMinh Pham, [address]Pharm[address]D., BCPS, AAHIVP []  Estella HuskMichelle Turner, Pharm.D., BCPS, AAHIVP [x]  Lysle Pearlachel Rumbarger, PharmD, BCPS []  Casilda Carlsaylor Stone, PharmD, BCPS []  Pollyann SamplesAndy Johnston, PharmD, BCPS  Positive urine culture Treated with Cephalexin, organism sensitive to the same and no further patient follow-up is required at this time.  Theresa Shaw, Theresa Shaw 04/14/2017, 11:09 AM

## 2017-04-27 ENCOUNTER — Ambulatory Visit: Payer: Medicaid Other | Admitting: Family Medicine

## 2017-05-04 ENCOUNTER — Ambulatory Visit: Payer: Medicaid Other | Admitting: Gastroenterology

## 2017-05-29 NOTE — L&D Delivery Note (Signed)
Delivery Note At 5:25 PM a viable and healthy female was delivered via Vaginal,delivery over pushing x 3 contractions. Spontaneous (Presentation vertex, LOA, with right shoulder within the elongate cervix, and responding nicely to Counterclockwise rotation of the shoulders, rotating the left shoulder to anterior position, and releasing the arm.: ;  ).  APGAR: 8, 9; weight  .   Placenta status: intact schultz, spontaneous., .  Cord:  with the following complications: .  Cord pH: not done  Anesthesia:  None  Episiotomy: None Lacerations: None Suture Repair:  Est. Blood Loss (mL):  100 cc  Mom to postpartum.  Baby to Couplet care / Skin to Skin.  Tilda BurrowJohn V Reginal Wojcicki 12/16/2017, 5:44 PM

## 2017-06-08 ENCOUNTER — Ambulatory Visit: Payer: Medicaid Other | Admitting: Adult Health

## 2017-06-12 ENCOUNTER — Encounter: Payer: Self-pay | Admitting: Nurse Practitioner

## 2017-06-12 ENCOUNTER — Telehealth: Payer: Self-pay | Admitting: Nurse Practitioner

## 2017-06-12 ENCOUNTER — Ambulatory Visit: Payer: Medicaid Other | Admitting: Nurse Practitioner

## 2017-06-12 ENCOUNTER — Encounter: Payer: Self-pay | Admitting: Gastroenterology

## 2017-06-12 NOTE — Telephone Encounter (Signed)
PATIENT WAS A NO SHOW AND LETTER SENT  °

## 2017-06-12 NOTE — Telephone Encounter (Signed)
Noted  

## 2017-06-13 ENCOUNTER — Ambulatory Visit (INDEPENDENT_AMBULATORY_CARE_PROVIDER_SITE_OTHER): Payer: Medicaid Other

## 2017-06-13 ENCOUNTER — Telehealth: Payer: Self-pay | Admitting: Adult Health

## 2017-06-13 ENCOUNTER — Other Ambulatory Visit: Payer: Self-pay | Admitting: Adult Health

## 2017-06-13 ENCOUNTER — Ambulatory Visit: Payer: Medicaid Other | Admitting: Adult Health

## 2017-06-13 ENCOUNTER — Encounter: Payer: Self-pay | Admitting: Adult Health

## 2017-06-13 ENCOUNTER — Other Ambulatory Visit: Payer: Medicaid Other

## 2017-06-13 VITALS — BP 130/70 | HR 117 | Ht 64.0 in

## 2017-06-13 DIAGNOSIS — Z86718 Personal history of other venous thrombosis and embolism: Secondary | ICD-10-CM | POA: Diagnosis not present

## 2017-06-13 DIAGNOSIS — Z3682 Encounter for antenatal screening for nuchal translucency: Secondary | ICD-10-CM

## 2017-06-13 DIAGNOSIS — Z3490 Encounter for supervision of normal pregnancy, unspecified, unspecified trimester: Secondary | ICD-10-CM

## 2017-06-13 DIAGNOSIS — O09219 Supervision of pregnancy with history of pre-term labor, unspecified trimester: Secondary | ICD-10-CM

## 2017-06-13 DIAGNOSIS — Z3201 Encounter for pregnancy test, result positive: Secondary | ICD-10-CM | POA: Diagnosis not present

## 2017-06-13 DIAGNOSIS — R112 Nausea with vomiting, unspecified: Secondary | ICD-10-CM | POA: Diagnosis not present

## 2017-06-13 DIAGNOSIS — O3680X0 Pregnancy with inconclusive fetal viability, not applicable or unspecified: Secondary | ICD-10-CM

## 2017-06-13 DIAGNOSIS — F32A Depression, unspecified: Secondary | ICD-10-CM

## 2017-06-13 DIAGNOSIS — F329 Major depressive disorder, single episode, unspecified: Secondary | ICD-10-CM

## 2017-06-13 DIAGNOSIS — O219 Vomiting of pregnancy, unspecified: Secondary | ICD-10-CM

## 2017-06-13 DIAGNOSIS — B1920 Unspecified viral hepatitis C without hepatic coma: Secondary | ICD-10-CM

## 2017-06-13 DIAGNOSIS — O09899 Supervision of other high risk pregnancies, unspecified trimester: Secondary | ICD-10-CM

## 2017-06-13 LAB — POCT URINE PREGNANCY: PREG TEST UR: POSITIVE — AB

## 2017-06-13 MED ORDER — ENOXAPARIN SODIUM 40 MG/0.4ML ~~LOC~~ SOLN: 40.0000 mg | SUBCUTANEOUS | 7 refills | Status: DC

## 2017-06-13 MED ORDER — DOXYLAMINE-PYRIDOXINE 10-10 MG PO TBEC: DELAYED_RELEASE_TABLET | ORAL | 3 refills | Status: DC

## 2017-06-13 MED ORDER — PRENATAL PLUS 27-1 MG PO TABS: 1.0000 | ORAL_TABLET | Freq: Every day | ORAL | 12 refills | Status: DC

## 2017-06-13 NOTE — Progress Notes (Signed)
US 12+4 wks,single IUP,anterior pl gr 0,normal left ovary,simple right ovarian cyst 3.9 x 3.3 x 3.8 cm,fhr 166 bpm,NB present,NT 1.5 mm,EDD 12/22/2017 by today's ultrasound,unknown LMP

## 2017-06-13 NOTE — Addendum Note (Signed)
Addended by: Cyril MourningGRIFFIN, Gearold Wainer A on: 06/13/2017 02:35 PM   Modules accepted: Orders

## 2017-06-13 NOTE — Telephone Encounter (Signed)
No voice mail.
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
## 2017-06-16 LAB — INTEGRATED 1
CROWN RUMP LENGTH MAT SCREEN: 62.7 mm
Gest. Age on Collection Date: 12.6 weeks
MATERNAL AGE AT EDD: 29.1 a
NUMBER OF FETUSES: 1
Nuchal Translucency (NT): 1.5 mm
PAPP-A Value: 316.5 ng/mL
Weight: 155 [lb_av]

## 2017-06-21 ENCOUNTER — Other Ambulatory Visit (HOSPITAL_COMMUNITY)
Admission: RE | Admit: 2017-06-21 | Discharge: 2017-06-21 | Disposition: A | Discharge status: Home / Self Care | Payer: Medicaid Other | Source: Ambulatory Visit | Attending: Obstetrics & Gynecology | Admitting: Obstetrics & Gynecology

## 2017-06-21 ENCOUNTER — Encounter: Payer: Self-pay | Admitting: Women's Health

## 2017-06-21 ENCOUNTER — Ambulatory Visit: Payer: Medicaid Other | Admitting: *Deleted

## 2017-06-21 ENCOUNTER — Ambulatory Visit (INDEPENDENT_AMBULATORY_CARE_PROVIDER_SITE_OTHER): Payer: Medicaid Other | Admitting: Women's Health

## 2017-06-21 VITALS — BP 94/78 | HR 72

## 2017-06-21 DIAGNOSIS — F17200 Nicotine dependence, unspecified, uncomplicated: Secondary | ICD-10-CM

## 2017-06-21 DIAGNOSIS — Z3A13 13 weeks gestation of pregnancy: Secondary | ICD-10-CM

## 2017-06-21 DIAGNOSIS — N870 Mild cervical dysplasia: Secondary | ICD-10-CM | POA: Diagnosis not present

## 2017-06-21 DIAGNOSIS — Z3482 Encounter for supervision of other normal pregnancy, second trimester: Secondary | ICD-10-CM | POA: Diagnosis not present

## 2017-06-21 DIAGNOSIS — O09891 Supervision of other high risk pregnancies, first trimester: Secondary | ICD-10-CM

## 2017-06-21 DIAGNOSIS — O99341 Other mental disorders complicating pregnancy, first trimester: Secondary | ICD-10-CM

## 2017-06-21 DIAGNOSIS — F418 Other specified anxiety disorders: Secondary | ICD-10-CM

## 2017-06-21 DIAGNOSIS — G8220 Paraplegia, unspecified: Secondary | ICD-10-CM

## 2017-06-21 DIAGNOSIS — O0990 Supervision of high risk pregnancy, unspecified, unspecified trimester: Secondary | ICD-10-CM | POA: Insufficient documentation

## 2017-06-21 DIAGNOSIS — Z3481 Encounter for supervision of other normal pregnancy, first trimester: Secondary | ICD-10-CM

## 2017-06-21 DIAGNOSIS — O09219 Supervision of pregnancy with history of pre-term labor, unspecified trimester: Secondary | ICD-10-CM

## 2017-06-21 DIAGNOSIS — F17210 Nicotine dependence, cigarettes, uncomplicated: Secondary | ICD-10-CM

## 2017-06-21 DIAGNOSIS — O99321 Drug use complicating pregnancy, first trimester: Secondary | ICD-10-CM

## 2017-06-21 DIAGNOSIS — Z86718 Personal history of other venous thrombosis and embolism: Secondary | ICD-10-CM

## 2017-06-21 DIAGNOSIS — O09211 Supervision of pregnancy with history of pre-term labor, first trimester: Secondary | ICD-10-CM

## 2017-06-21 DIAGNOSIS — O09899 Supervision of other high risk pregnancies, unspecified trimester: Secondary | ICD-10-CM

## 2017-06-21 DIAGNOSIS — Z79891 Long term (current) use of opiate analgesic: Secondary | ICD-10-CM

## 2017-06-21 NOTE — Patient Instructions (Addendum)
Theresa Shaw, I greatly value your feedback.  If you receive a survey following your visit with [place] today, we appreciate you taking the time to fill it out.  Thanks, [name], CNM, WHNP-BC  You will have your sugar test next visit.  Please do not eat or drink anything after midnight the night before you come, not even water.  You will be here for at least two hours.      Nausea & Vomiting  Have saltine crackers or pretzels by your bed and eat a few bites before you raise your head out of bed in the morning  Eat small frequent meals throughout the day instead of large meals  Drink plenty of fluids throughout the day to stay hydrated, just don't drink a lot of fluids with your meals.  This can make your stomach fill up faster making you feel sick  Do not brush your teeth right after you eat  Products with real ginger are good for nausea, like ginger ale and ginger hard candy Make sure it says made with real ginger!  Sucking on sour candy like lemon heads is also good for nausea  If your prenatal vitamins make you nauseated, take them at night so you will sleep through the nausea  Sea Bands  If you feel like you need medicine for the nausea & vomiting please let us know  If you are unable to keep any fluids or food down please let us know   Constipation  Drink plenty of fluid, preferably water, throughout the day  Eat foods high in fiber such as fruits, vegetables, and grains  Exercise, such as walking, is a good way to keep your bowels regular  Drink warm fluids, especially warm prune juice, or decaf coffee  Eat a 1/2 cup of real oatmeal (not instant), 1/2 cup applesauce, and 1/2-1 cup warm prune juice every day  If needed, you may take Colace (docusate sodium) stool softener once or twice a day to help keep the stool soft. If you are pregnant, wait until you are out of your first trimester (12-14 weeks of pregnancy)  If you still are having problems with constipation,  you may take Miralax once daily as needed to help keep your bowels regular.  If you are pregnant, wait until you are out of your first trimester (12-14 weeks of pregnancy)   First Trimester of Pregnancy The first trimester of pregnancy is from week 1 until the end of week 12 (months 1 through 3). A week after a sperm fertilizes an egg, the egg will implant on the wall of the uterus. This embryo will begin to develop into a baby. Genes from you and your partner are forming the baby. The female genes determine whether the baby is a boy or a girl. At 6-8 weeks, the eyes and face are formed, and the heartbeat can be seen on ultrasound. At the end of 12 weeks, all the baby's organs are formed.  Now that you are pregnant, you will want to do everything you can to have a healthy baby. Two of the most important things are to get good prenatal care and to follow your health care provider's instructions. Prenatal care is all the medical care you receive before the baby's birth. This care will help prevent, find, and treat any problems during the pregnancy and childbirth. BODY CHANGES Your body goes through many changes during pregnancy. The changes vary from woman to woman.   You may gain or  lose a couple of pounds at first.  You may feel sick to your stomach (nauseous) and throw up (vomit). If the vomiting is uncontrollable, call your health care provider.  You may tire easily.  You may develop headaches that can be relieved by medicines approved by your health care provider.  You may urinate more often. Painful urination may mean you have a bladder infection.  You may develop heartburn as a result of your pregnancy.  You may develop constipation because certain hormones are causing the muscles that push waste through your intestines to slow down.  You may develop hemorrhoids or swollen, bulging veins (varicose veins).  Your breasts may begin to grow larger and become tender. Your nipples may stick out  more, and the tissue that surrounds them (areola) may become darker.  Your gums may bleed and may be sensitive to brushing and flossing.  Dark spots or blotches (chloasma, mask of pregnancy) may develop on your face. This will likely fade after the baby is born.  Your menstrual periods will stop.  You may have a loss of appetite.  You may develop cravings for certain kinds of food.  You may have changes in your emotions from day to day, such as being excited to be pregnant or being concerned that something may go wrong with the pregnancy and baby.  You may have more vivid and strange dreams.  You may have changes in your hair. These can include thickening of your hair, rapid growth, and changes in texture. Some women also have hair loss during or after pregnancy, or hair that feels dry or thin. Your hair will most likely return to normal after your baby is born. WHAT TO EXPECT AT YOUR PRENATAL VISITS During a routine prenatal visit:  You will be weighed to make sure you and the baby are growing normally.  Your blood pressure will be taken.  Your abdomen will be measured to track your baby's growth.  The fetal heartbeat will be listened to starting around week 10 or 12 of your pregnancy.  Test results from any previous visits will be discussed. Your health care provider may ask you:  How you are feeling.  If you are feeling the baby move.  If you have had any abnormal symptoms, such as leaking fluid, bleeding, severe headaches, or abdominal cramping.  If you have any questions. Other tests that may be performed during your first trimester include:  Blood tests to find your blood type and to check for the presence of any previous infections. They will also be used to check for low iron levels (anemia) and Rh antibodies. Later in the pregnancy, blood tests for diabetes will be done along with other tests if problems develop.  Urine tests to check for infections, diabetes, or  protein in the urine.  An ultrasound to confirm the proper growth and development of the baby.  An amniocentesis to check for possible genetic problems.  Fetal screens for spina bifida and Down syndrome.  You may need other tests to make sure you and the baby are doing well. HOME CARE INSTRUCTIONS  Medicines  Follow your health care provider's instructions regarding medicine use. Specific medicines may be either safe or unsafe to take during pregnancy.  Take your prenatal vitamins as directed.  If you develop constipation, try taking a stool softener if your health care provider approves. Diet  Eat regular, well-balanced meals. Choose a variety of foods, such as meat or vegetable-based protein, fish, milk and low-fat  dairy products, vegetables, fruits, and whole grain breads and cereals. Your health care provider will help you determine the amount of weight gain that is right for you.  Avoid raw meat and uncooked cheese. These carry germs that can cause birth defects in the baby.  Eating four or five small meals rather than three large meals a day may help relieve nausea and vomiting. If you start to feel nauseous, eating a few soda crackers can be helpful. Drinking liquids between meals instead of during meals also seems to help nausea and vomiting.  If you develop constipation, eat more high-fiber foods, such as fresh vegetables or fruit and whole grains. Drink enough fluids to keep your urine clear or pale yellow. Activity and Exercise  Exercise only as directed by your health care provider. Exercising will help you:  Control your weight.  Stay in shape.  Be prepared for labor and delivery.  Experiencing pain or cramping in the lower abdomen or low back is a good sign that you should stop exercising. Check with your health care provider before continuing normal exercises.  Try to avoid standing for long periods of time. Move your legs often if you must stand in one place for  a long time.  Avoid heavy lifting.  Wear low-heeled shoes, and practice good posture.  You may continue to have sex unless your health care provider directs you otherwise. Relief of Pain or Discomfort  Wear a good support bra for breast tenderness.   Take warm sitz baths to soothe any pain or discomfort caused by hemorrhoids. Use hemorrhoid cream if your health care provider approves.   Rest with your legs elevated if you have leg cramps or low back pain.  If you develop varicose veins in your legs, wear support hose. Elevate your feet for 15 minutes, 3-4 times a day. Limit salt in your diet. Prenatal Care  Schedule your prenatal visits by the twelfth week of pregnancy. They are usually scheduled monthly at first, then more often in the last 2 months before delivery.  Write down your questions. Take them to your prenatal visits.  Keep all your prenatal visits as directed by your health care provider. Safety  Wear your seat belt at all times when driving.  Make a list of emergency phone numbers, including numbers for family, friends, the hospital, and police and fire departments. General Tips  Ask your health care provider for a referral to a local prenatal education class. Begin classes no later than at the beginning of month 6 of your pregnancy.  Ask for help if you have counseling or nutritional needs during pregnancy. Your health care provider can offer advice or refer you to specialists for help with various needs.  Do not use hot tubs, steam rooms, or saunas.  Do not douche or use tampons or scented sanitary pads.  Do not cross your legs for long periods of time.  Avoid cat litter boxes and soil used by cats. These carry germs that can cause birth defects in the baby and possibly loss of the fetus by miscarriage or stillbirth.  Avoid all smoking, herbs, alcohol, and medicines not prescribed by your health care provider. Chemicals in these affect the formation and  growth of the baby.  Schedule a dentist appointment. At home, brush your teeth with a soft toothbrush and be gentle when you floss. SEEK MEDICAL CARE IF:   You have dizziness.  You have mild pelvic cramps, pelvic pressure, or nagging pain in the abdominal  area.  You have persistent nausea, vomiting, or diarrhea.  You have a bad smelling vaginal discharge.  You have pain with urination.  You notice increased swelling in your face, hands, legs, or ankles. SEEK IMMEDIATE MEDICAL CARE IF:   You have a fever.  You are leaking fluid from your vagina.  You have spotting or bleeding from your vagina.  You have severe abdominal cramping or pain.  You have rapid weight gain or loss.  You vomit blood or material that looks like coffee grounds.  You are exposed to [place] measles and have never had them.  You are exposed to fifth disease or chickenpox.  You develop a severe headache.  You have shortness of breath.  You have any kind of trauma, such as from a fall or a car accident. Document Released: 05/09/2001 Document Revised: 09/29/2013 Document Reviewed: 03/25/2013 [facility] Patient Information 2015 [place], [place]. This information is not intended to replace advice given to you by your health care provider. Make sure you discuss any questions you have with your health care provider.
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
## 2017-06-22 ENCOUNTER — Telehealth: Payer: Self-pay | Admitting: Women's Health

## 2017-06-22 ENCOUNTER — Ambulatory Visit (INDEPENDENT_AMBULATORY_CARE_PROVIDER_SITE_OTHER): Payer: Medicaid Other | Admitting: Family Medicine

## 2017-06-22 ENCOUNTER — Encounter: Payer: Self-pay | Admitting: Family Medicine

## 2017-06-22 ENCOUNTER — Telehealth: Payer: Self-pay | Admitting: *Deleted

## 2017-06-22 VITALS — BP 104/62 | Temp 98.6°F

## 2017-06-22 DIAGNOSIS — I889 Nonspecific lymphadenitis, unspecified: Secondary | ICD-10-CM | POA: Diagnosis not present

## 2017-06-22 LAB — PMP SCREEN PROFILE (10S), URINE
AMPHETAMINE SCREEN URINE: NEGATIVE ng/mL
BARBITURATE SCREEN URINE: NEGATIVE ng/mL
BENZODIAZEPINE SCREEN, URINE: NEGATIVE ng/mL
CANNABINOIDS UR QL SCN: POSITIVE ng/mL — AB
Cocaine (Metab) Scrn, Ur: NEGATIVE ng/mL
Creatinine(Crt), U: 191.9 mg/dL (ref 20.0–300.0)
METHADONE SCREEN, URINE: POSITIVE ng/mL — AB
OXYCODONE+OXYMORPHONE UR QL SCN: NEGATIVE ng/mL
Opiate Scrn, Ur: NEGATIVE ng/mL
PH UR, DRUG SCRN: 5.8 (ref 4.5–8.9)
PHENCYCLIDINE QUANTITATIVE URINE: NEGATIVE ng/mL
Propoxyphene Scrn, Ur: NEGATIVE ng/mL

## 2017-06-22 LAB — MED LIST OPTION NOT SELECTED

## 2017-06-22 MED ORDER — ENOXAPARIN SODIUM 80 MG/0.8ML ~~LOC~~ SOLN: 80.0000 mg | SUBCUTANEOUS | 7 refills | Status: DC

## 2017-06-22 MED ORDER — AMOXICILLIN 500 MG PO CAPS: 500.0000 mg | ORAL_CAPSULE | Freq: Three times a day (TID) | ORAL | 0 refills | Status: DC

## 2017-06-22 NOTE — Progress Notes (Signed)
   Subjective:    Patient ID: Theresa Shaw, female    DOB: 07/07/1990, 27 y.o.   MRN: [id number]  Patient is four months pregnant.   Sinusitis  This is a new problem. Associated symptoms include congestion and coughing. Past treatments include nothing.   Throat and inflamm  And coughing up stuff    No a lot of a smoker   Patient does smoke a bit, no major fever some tenderness glands right side of throat   Review of Systems  HENT: Positive for congestion.   Respiratory: Positive for cough.        Objective:   Physical Exam Alert active good hydration mild nasal congestion pharynx slight erythema tender anterior glands right anterior neck neck supple lungs clear heart rate and rhythm occasional cough       Assessment & Plan:  Impression respiratory infection with cervical lymphadenitis plan antibiotics prescribed symptom care discussed warning signs discussed

## 2017-06-22 NOTE — Telephone Encounter (Signed)
Tried to call pt, need to let her know Dr. Despina HiddenEure wants to increase Lovenox to 80mg  daily- new rx sent. 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 On 06/13/17 she told Victorino DikeJennifer she was supposed to be starting tx for active HepC? I do not see any labs that indicate active HepC? Theresa MarkerKimberly R. Romanda Shaw, CNM, WHNP-BC 06/22/2017 2:20 PM

## 2017-06-22 NOTE — Telephone Encounter (Signed)
Boyfriend called stating Anessia's lymph nodes were swollen on her right side of her throat and she is concerned was caused by her taking the Lovenox. Informed him I did not think that would cause swollen lymph nodes that patient could have a virus or something coming on. States she has appointment with Dr Gerda DissLuking this afternoon for them to swab for strep. Advised to push fluids and good hand hygiene. Verbalized understanding.

## 2017-06-23 LAB — URINE CULTURE

## 2017-06-25 ENCOUNTER — Telehealth: Payer: Self-pay | Admitting: Women's Health

## 2017-06-25 DIAGNOSIS — O2341 Unspecified infection of urinary tract in pregnancy, first trimester: Secondary | ICD-10-CM | POA: Insufficient documentation

## 2017-06-25 MED ORDER — NITROFURANTOIN MONOHYD MACRO 100 MG PO CAPS: 100.0000 mg | ORAL_CAPSULE | Freq: Two times a day (BID) | ORAL | 0 refills | Status: DC

## 2017-06-25 MED ORDER — NITROFURANTOIN MONOHYD MACRO 100 MG PO CAPS: 100.0000 mg | ORAL_CAPSULE | Freq: Every day | ORAL | 6 refills | Status: DC

## 2017-06-25 NOTE — Telephone Encounter (Signed)
Called pt, notified her Dr. Despina HiddenEure recommends increasing Lovenox to 80mg  daily, rx sent Friday. Urine cx shows UTI, also had one in Nov, rx macrobid bid x 7d, then nightly thereafter for suppression d/t increased r/f uti d/t pregnancy/self-cathing, etc. Discussed HepC hx, states she was originally dx in 2015, thought she was going for tx the day before she came to see us for preg test. Discussed per epic labs did have + HepC Ab8/2017, but neg HepC RNA 01/2017, so virus cleared on it's own.  Cheral MarkerKimberly R. Aqsa Sensabaugh, CNM, [facility] 06/25/2017 4:47 PM

## 2017-06-26 LAB — CYTOLOGY - PAP
Chlamydia: NEGATIVE
Neisseria Gonorrhea: NEGATIVE

## 2017-06-27 ENCOUNTER — Encounter: Payer: Self-pay | Admitting: Women's Health

## 2017-06-27 ENCOUNTER — Telehealth: Payer: Self-pay | Admitting: *Deleted

## 2017-06-27 DIAGNOSIS — R87619 Unspecified abnormal cytological findings in specimens from cervix uteri: Secondary | ICD-10-CM | POA: Insufficient documentation

## 2017-06-27 NOTE — Telephone Encounter (Signed)
VM full

## 2017-06-27 NOTE — Telephone Encounter (Signed)
VM not set up.

## 2017-06-28 NOTE — Telephone Encounter (Signed)
Patient states she does not think she needs to take the 80mg  Lovenox daily. States she cut her finger while shaving her legs and "bled profusely for hours, blood was like water." Advised patient to take the 80mg  but if she wasn't going to take it at least do the 40mg . Patient states that is what she was going to do. Informed she could discuss with Dr Despina HiddenEure at her next visit. Also informed she had abnormal pap and needed colpo. Verbalized understanding.

## 2017-07-12 ENCOUNTER — Other Ambulatory Visit: Payer: Medicaid Other

## 2017-07-12 ENCOUNTER — Encounter: Payer: Self-pay | Admitting: Obstetrics & Gynecology

## 2017-07-12 ENCOUNTER — Ambulatory Visit (INDEPENDENT_AMBULATORY_CARE_PROVIDER_SITE_OTHER): Payer: Medicaid Other | Admitting: Obstetrics & Gynecology

## 2017-07-12 VITALS — BP 122/78 | HR 121 | Wt 180.0 lb

## 2017-07-12 DIAGNOSIS — Z131 Encounter for screening for diabetes mellitus: Secondary | ICD-10-CM

## 2017-07-12 DIAGNOSIS — Z331 Pregnant state, incidental: Secondary | ICD-10-CM

## 2017-07-12 DIAGNOSIS — Z3482 Encounter for supervision of other normal pregnancy, second trimester: Secondary | ICD-10-CM

## 2017-07-12 DIAGNOSIS — Z87440 Personal history of urinary (tract) infections: Secondary | ICD-10-CM

## 2017-07-12 DIAGNOSIS — Z1389 Encounter for screening for other disorder: Secondary | ICD-10-CM

## 2017-07-12 DIAGNOSIS — Z1379 Encounter for other screening for genetic and chromosomal anomalies: Secondary | ICD-10-CM

## 2017-07-12 DIAGNOSIS — Z3A16 16 weeks gestation of pregnancy: Secondary | ICD-10-CM

## 2017-07-12 LAB — POCT URINALYSIS DIPSTICK
GLUCOSE UA: NEGATIVE
KETONES UA: NEGATIVE
Nitrite, UA: POSITIVE
Protein, UA: NEGATIVE
RBC UA: NEGATIVE

## 2017-07-12 MED ORDER — CEPHALEXIN 500 MG PO CAPS: 500.0000 mg | ORAL_CAPSULE | Freq: Three times a day (TID) | ORAL | 0 refills | Status: DC

## 2017-07-12 NOTE — Progress Notes (Signed)
[id number] 2749w5d Estimated Date of Delivery: 12/22/17  Blood pressure 122/78, pulse (!) 121, weight 180 lb (81.6 kg), last menstrual period 03/07/2017.   BP weight and urine results all reviewed and noted.  Please refer to the obstetrical flow sheet for the fundal height and fetal heart rate documentation:  Patient reports good fetal movement, denies any bleeding and no rupture of membranes symptoms or regular contractions. Patient is without complaints. All questions were answered.  Orders Placed This Encounter  Procedures  . Urine Culture  . US OB Comp + 14 Wk  . INTEGRATED 2  . POCT urinalysis dipstick   Meds ordered this encounter  Medications  . cephALEXin (KEFLEX) 500 MG capsule    Sig: Take 1 capsule (500 mg total) by mouth 3 (three) times daily.    Dispense:  28 capsule    Refill:  0     Plan:  Continued routine obstetrical care,  Could not do a colposcopic eval because of anatomy, will rerty at 8 weeks postpartum  Stopped her macrobid(did not get refill) so will culture urine today and start keflex for 7 days then restart her macrobid  Return in about 3 weeks (around 08/02/2017) for 20 week sono, LROB.

## 2017-07-13 LAB — GLUCOSE TOLERANCE, 2 HOURS W/ 1HR
GLUCOSE, 2 HOUR: 126 mg/dL (ref 65–152)
Glucose, 1 hour: 145 mg/dL (ref 65–179)
Glucose, Fasting: 94 mg/dL — ABNORMAL HIGH (ref 65–91)

## 2017-07-14 LAB — URINE CULTURE

## 2017-07-16 ENCOUNTER — Encounter: Payer: Self-pay | Admitting: Women's Health

## 2017-07-16 ENCOUNTER — Other Ambulatory Visit: Payer: Self-pay | Admitting: Women's Health

## 2017-07-16 ENCOUNTER — Telehealth: Payer: Self-pay | Admitting: *Deleted

## 2017-07-16 DIAGNOSIS — O24410 Gestational diabetes mellitus in pregnancy, diet controlled: Secondary | ICD-10-CM | POA: Insufficient documentation

## 2017-07-17 ENCOUNTER — Encounter: Payer: Self-pay | Admitting: *Deleted

## 2017-07-17 NOTE — Telephone Encounter (Signed)
Attempted to call pt twice. No answer. Voicemail not set up. My Chart message sent to inform pt of gestational diabetes

## 2017-07-18 ENCOUNTER — Telehealth: Payer: Self-pay | Admitting: Women's Health

## 2017-07-18 DIAGNOSIS — O24410 Gestational diabetes mellitus in pregnancy, diet controlled: Secondary | ICD-10-CM

## 2017-07-18 NOTE — Telephone Encounter (Signed)
Attempted to call pt to notify of GDM, no answer, VM box not set up. MyChart message sent by RN yesterday. Will also send letter to address on file.  Cheral MarkerKimberly R. Meriel Kelliher, CNM, [facility] 07/18/2017 1:38 PM

## 2017-07-23 ENCOUNTER — Telehealth: Payer: Self-pay | Admitting: *Deleted

## 2017-07-23 NOTE — Telephone Encounter (Signed)
Meter, lancets and strips called to [facility]. Pt to check BS 4 times daily.

## 2017-07-25 ENCOUNTER — Ambulatory Visit: Payer: Self-pay | Admitting: Registered"

## 2017-08-03 ENCOUNTER — Other Ambulatory Visit: Payer: Self-pay | Admitting: Obstetrics & Gynecology

## 2017-08-03 DIAGNOSIS — Z363 Encounter for antenatal screening for malformations: Secondary | ICD-10-CM

## 2017-08-03 DIAGNOSIS — Z3482 Encounter for supervision of other normal pregnancy, second trimester: Secondary | ICD-10-CM

## 2017-08-06 ENCOUNTER — Other Ambulatory Visit: Payer: Self-pay

## 2017-08-06 ENCOUNTER — Encounter: Payer: Medicaid Other | Admitting: Obstetrics and Gynecology

## 2017-08-06 ENCOUNTER — Encounter: Payer: Self-pay | Admitting: Obstetrics & Gynecology

## 2017-08-06 ENCOUNTER — Ambulatory Visit (INDEPENDENT_AMBULATORY_CARE_PROVIDER_SITE_OTHER): Payer: Medicaid Other | Admitting: Obstetrics & Gynecology

## 2017-08-06 ENCOUNTER — Ambulatory Visit (INDEPENDENT_AMBULATORY_CARE_PROVIDER_SITE_OTHER): Payer: Medicaid Other

## 2017-08-06 VITALS — BP 100/64 | HR 101

## 2017-08-06 DIAGNOSIS — Z3A20 20 weeks gestation of pregnancy: Secondary | ICD-10-CM

## 2017-08-06 DIAGNOSIS — O99352 Diseases of the nervous system complicating pregnancy, second trimester: Secondary | ICD-10-CM

## 2017-08-06 DIAGNOSIS — Z3482 Encounter for supervision of other normal pregnancy, second trimester: Secondary | ICD-10-CM

## 2017-08-06 DIAGNOSIS — Z331 Pregnant state, incidental: Secondary | ICD-10-CM

## 2017-08-06 DIAGNOSIS — Z363 Encounter for antenatal screening for malformations: Secondary | ICD-10-CM

## 2017-08-06 DIAGNOSIS — O0992 Supervision of high risk pregnancy, unspecified, second trimester: Secondary | ICD-10-CM

## 2017-08-06 DIAGNOSIS — Z1389 Encounter for screening for other disorder: Secondary | ICD-10-CM

## 2017-08-06 DIAGNOSIS — G8220 Paraplegia, unspecified: Secondary | ICD-10-CM

## 2017-08-06 LAB — POCT URINALYSIS DIPSTICK
Blood, UA: NEGATIVE
Glucose, UA: NEGATIVE
Ketones, UA: NEGATIVE
Leukocytes, UA: NEGATIVE
Nitrite, UA: NEGATIVE
PROTEIN UA: NEGATIVE

## 2017-08-06 MED ORDER — PROMETHAZINE HCL 25 MG PO TABS: 25.0000 mg | ORAL_TABLET | Freq: Four times a day (QID) | ORAL | 1 refills | Status: DC | PRN

## 2017-08-06 MED ORDER — OMEPRAZOLE 20 MG PO CPDR: 20.0000 mg | DELAYED_RELEASE_CAPSULE | Freq: Every day | ORAL | 6 refills | Status: DC

## 2017-08-06 NOTE — Progress Notes (Signed)
[id number] 644w2d Estimated Date of Delivery: 12/22/17  Blood pressure 100/64, pulse (!) 101, last menstrual period 03/07/2017.   BP weight and urine results all reviewed and noted.  Please refer to the obstetrical flow sheet for the fundal height and fetal heart rate documentation:  Patient reports good fetal movement, denies any bleeding and no rupture of membranes symptoms or regular contractions. Patient is without complaints. All questions were answered.  Orders Placed This Encounter  Procedures  . US OB Limited  . POCT urinalysis dipstick    Plan:  Continued routine obstetrical care, continue nightly macrobid to prevent UTIs  Sonogram today is normal  CBG are good  Continue lovenox    Return in about 4 weeks (around 09/03/2017) for repeat sonogram, HROB.

## 2017-08-06 NOTE — Progress Notes (Signed)
US 20+2 wks,breech,anterior pl gr 0,normal ovaries bilat,cx 4.1 cm,svp of fluid 5 cm,fhr 138 bpm,efw 318 g 24%,limited view of spine because of fetal position,please have pt come back for additional images,no obvious abnormalities

## 2017-09-03 ENCOUNTER — Encounter (HOSPITAL_COMMUNITY): Payer: Self-pay

## 2017-09-03 ENCOUNTER — Encounter: Payer: Self-pay | Admitting: Women's Health

## 2017-09-03 ENCOUNTER — Inpatient Hospital Stay (HOSPITAL_COMMUNITY)
Admission: AD | Admit: 2017-09-03 | Discharge: 2017-09-04 | Disposition: A | Discharge status: Home / Self Care | Payer: Medicaid Other | Source: Ambulatory Visit | Attending: Obstetrics & Gynecology | Admitting: Obstetrics & Gynecology

## 2017-09-03 ENCOUNTER — Ambulatory Visit (INDEPENDENT_AMBULATORY_CARE_PROVIDER_SITE_OTHER): Payer: Medicaid Other | Admitting: Women's Health

## 2017-09-03 ENCOUNTER — Ambulatory Visit (INDEPENDENT_AMBULATORY_CARE_PROVIDER_SITE_OTHER): Payer: Medicaid Other

## 2017-09-03 VITALS — BP 110/60 | HR 96

## 2017-09-03 DIAGNOSIS — O9989 Other specified diseases and conditions complicating pregnancy, childbirth and the puerperium: Secondary | ICD-10-CM

## 2017-09-03 DIAGNOSIS — F17210 Nicotine dependence, cigarettes, uncomplicated: Secondary | ICD-10-CM | POA: Diagnosis not present

## 2017-09-03 DIAGNOSIS — O2342 Unspecified infection of urinary tract in pregnancy, second trimester: Secondary | ICD-10-CM

## 2017-09-03 DIAGNOSIS — B373 Candidiasis of vulva and vagina: Secondary | ICD-10-CM | POA: Diagnosis not present

## 2017-09-03 DIAGNOSIS — Z3A24 24 weeks gestation of pregnancy: Secondary | ICD-10-CM | POA: Insufficient documentation

## 2017-09-03 DIAGNOSIS — Z331 Pregnant state, incidental: Secondary | ICD-10-CM

## 2017-09-03 DIAGNOSIS — O09212 Supervision of pregnancy with history of pre-term labor, second trimester: Secondary | ICD-10-CM

## 2017-09-03 DIAGNOSIS — O26879 Cervical shortening, unspecified trimester: Secondary | ICD-10-CM

## 2017-09-03 DIAGNOSIS — Z86718 Personal history of other venous thrombosis and embolism: Secondary | ICD-10-CM

## 2017-09-03 DIAGNOSIS — G8220 Paraplegia, unspecified: Secondary | ICD-10-CM

## 2017-09-03 DIAGNOSIS — O0992 Supervision of high risk pregnancy, unspecified, second trimester: Secondary | ICD-10-CM

## 2017-09-03 DIAGNOSIS — O0990 Supervision of high risk pregnancy, unspecified, unspecified trimester: Secondary | ICD-10-CM

## 2017-09-03 DIAGNOSIS — O98812 Other maternal infectious and parasitic diseases complicating pregnancy, second trimester: Secondary | ICD-10-CM | POA: Insufficient documentation

## 2017-09-03 DIAGNOSIS — B379 Candidiasis, unspecified: Secondary | ICD-10-CM

## 2017-09-03 DIAGNOSIS — O99342 Other mental disorders complicating pregnancy, second trimester: Secondary | ICD-10-CM

## 2017-09-03 DIAGNOSIS — N898 Other specified noninflammatory disorders of vagina: Secondary | ICD-10-CM | POA: Diagnosis present

## 2017-09-03 DIAGNOSIS — O99332 Smoking (tobacco) complicating pregnancy, second trimester: Secondary | ICD-10-CM

## 2017-09-03 DIAGNOSIS — Z1389 Encounter for screening for other disorder: Secondary | ICD-10-CM

## 2017-09-03 DIAGNOSIS — R102 Pelvic and perineal pain: Secondary | ICD-10-CM

## 2017-09-03 DIAGNOSIS — F418 Other specified anxiety disorders: Secondary | ICD-10-CM

## 2017-09-03 DIAGNOSIS — O26872 Cervical shortening, second trimester: Secondary | ICD-10-CM | POA: Diagnosis not present

## 2017-09-03 DIAGNOSIS — O99352 Diseases of the nervous system complicating pregnancy, second trimester: Secondary | ICD-10-CM

## 2017-09-03 DIAGNOSIS — B182 Chronic viral hepatitis C: Secondary | ICD-10-CM

## 2017-09-03 DIAGNOSIS — O24410 Gestational diabetes mellitus in pregnancy, diet controlled: Secondary | ICD-10-CM

## 2017-09-03 LAB — POCT URINALYSIS DIPSTICK
Blood, UA: NEGATIVE
Glucose, UA: NEGATIVE
KETONES UA: NEGATIVE
Leukocytes, UA: NEGATIVE
Nitrite, UA: NEGATIVE

## 2017-09-03 LAB — WET PREP, GENITAL
CLUE CELLS WET PREP: NONE SEEN
SPERM: NONE SEEN
Trich, Wet Prep: NONE SEEN
Yeast Wet Prep HPF POC: NONE SEEN

## 2017-09-03 MED ORDER — PROGESTERONE MICRONIZED 200 MG PO CAPS: 200.0000 mg | ORAL_CAPSULE | Freq: Every day | ORAL | 5 refills | Status: DC

## 2017-09-03 MED ORDER — FLUCONAZOLE 150 MG PO TABS: 150.0000 mg | ORAL_TABLET | Freq: Once | ORAL | 1 refills | Status: AC

## 2017-09-03 NOTE — Progress Notes (Signed)
[place] TA/TV:24+2 wks,anterior pl gr 0,svp of fluid 6.5 cm,normal ovaries bilat,cx 2.8-2.4 cm w/pressure,efw 682 g 41%,fhr 157 bpm,anatomy of spine complete,no obvious abnormalities

## 2017-09-03 NOTE — MAU Note (Signed)
Pt here with c/o spotting and mucus since being checked in the office today, also some cramping. Was "internally closed, but externally open some" per the patient. Hx of preterm delivery at 34 weeks.

## 2017-09-03 NOTE — Discharge Instructions (Signed)
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
## 2017-09-03 NOTE — Patient Instructions (Signed)
Theresa Shaw, I greatly value your feedback.  If you receive a survey following your visit with [place] today, we appreciate you taking the time to fill it out.  Thanks, [name], CNM, WHNP-BC   Call the office [phone]) or go to [facility] if:  You begin to have strong, frequent contractions  Your water breaks.  Sometimes it is a big gush of fluid, sometimes it is just a trickle that keeps getting your panties wet or running down your legs  You have vaginal bleeding.  It is normal to have a small amount of spotting if your cervix was checked.   You don't feel your baby moving like normal.  If you don't, get you something to eat and drink and lay down and focus on feeling your baby move.   If your baby is still not moving like normal, you should call the office or go to [facility].  Second Trimester of Pregnancy The second trimester is from week 13 through week 28, months 4 through 6. The second trimester is often a time when you feel your best. Your body has also adjusted to being pregnant, and you begin to feel better physically. Usually, morning sickness has lessened or quit completely, you may have more energy, and you may have an increase in appetite. The second trimester is also a time when the fetus is growing rapidly. At the end of the sixth month, the fetus is about 9 inches long and weighs about 1 pounds. You will likely begin to feel the baby move (quickening) between 18 and 20 weeks of the pregnancy. BODY CHANGES Your body goes through many changes during pregnancy. The changes vary from woman to woman.   Your weight will continue to increase. You will notice your lower abdomen bulging out.  You may begin to get stretch marks on your hips, abdomen, and breasts.  You may develop headaches that can be relieved by medicines approved by your health care provider.  You may urinate more often because the fetus is pressing on your bladder.  You may develop or continue  to have heartburn as a result of your pregnancy.  You may develop constipation because certain hormones are causing the muscles that push waste through your intestines to slow down.  You may develop hemorrhoids or swollen, bulging veins (varicose veins).  You may have back pain because of the weight gain and pregnancy hormones relaxing your joints between the bones in your pelvis and as a result of a shift in weight and the muscles that support your balance.  Your breasts will continue to grow and be tender.  Your gums may bleed and may be sensitive to brushing and flossing.  Dark spots or blotches (chloasma, mask of pregnancy) may develop on your face. This will likely fade after the baby is born.  A dark line from your belly button to the pubic area (linea nigra) may appear. This will likely fade after the baby is born.  You may have changes in your hair. These can include thickening of your hair, rapid growth, and changes in texture. Some women also have hair loss during or after pregnancy, or hair that feels dry or thin. Your hair will most likely return to normal after your baby is born. WHAT TO EXPECT AT YOUR PRENATAL VISITS During a routine prenatal visit:  You will be weighed to make sure you and the fetus are growing normally.  Your blood pressure will be taken.  Your abdomen  will be measured to track your baby's growth.  The fetal heartbeat will be listened to.  Any test results from the previous visit will be discussed. Your health care provider may ask you:  How you are feeling.  If you are feeling the baby move.  If you have had any abnormal symptoms, such as leaking fluid, bleeding, severe headaches, or abdominal cramping.  If you have any questions. Other tests that may be performed during your second trimester include:  Blood tests that check for:  Low iron levels (anemia).  Gestational diabetes (between 24 and 28 weeks).  Rh antibodies.  Urine tests to  check for infections, diabetes, or protein in the urine.  An ultrasound to confirm the proper growth and development of the baby.  An amniocentesis to check for possible genetic problems.  Fetal screens for spina bifida and Down syndrome. HOME CARE INSTRUCTIONS   Avoid all smoking, herbs, alcohol, and unprescribed drugs. These chemicals affect the formation and growth of the baby.  Follow your health care provider's instructions regarding medicine use. There are medicines that are either safe or unsafe to take during pregnancy.  Exercise only as directed by your health care provider. Experiencing uterine cramps is a good sign to stop exercising.  Continue to eat regular, healthy meals.  Wear a good support bra for breast tenderness.  Do not use hot tubs, steam rooms, or saunas.  Wear your seat belt at all times when driving.  Avoid raw meat, uncooked cheese, cat litter boxes, and soil used by cats. These carry germs that can cause birth defects in the baby.  Take your prenatal vitamins.  Try taking a stool softener (if your health care provider approves) if you develop constipation. Eat more high-fiber foods, such as fresh vegetables or fruit and whole grains. Drink plenty of fluids to keep your urine clear or pale yellow.  Take warm sitz baths to soothe any pain or discomfort caused by hemorrhoids. Use hemorrhoid cream if your health care provider approves.  If you develop varicose veins, wear support hose. Elevate your feet for 15 minutes, 3-4 times a day. Limit salt in your diet.  Avoid heavy lifting, wear low heel shoes, and practice good posture.  Rest with your legs elevated if you have leg cramps or low back pain.  Visit your dentist if you have not gone yet during your pregnancy. Use a soft toothbrush to brush your teeth and be gentle when you floss.  A sexual relationship may be continued unless your health care provider directs you otherwise.  Continue to go to all  your prenatal visits as directed by your health care provider. SEEK MEDICAL CARE IF:   You have dizziness.  You have mild pelvic cramps, pelvic pressure, or nagging pain in the abdominal area.  You have persistent nausea, vomiting, or diarrhea.  You have a bad smelling vaginal discharge.  You have pain with urination. SEEK IMMEDIATE MEDICAL CARE IF:   You have a fever.  You are leaking fluid from your vagina.  You have spotting or bleeding from your vagina.  You have severe abdominal cramping or pain.  You have rapid weight gain or loss.  You have shortness of breath with chest pain.  You notice sudden or extreme swelling of your face, hands, ankles, feet, or legs.  You have not felt your baby move in over an hour.  You have severe headaches that do not go away with medicine.  You have vision changes. Document Released:  05/09/2001 Document Revised: 05/20/2013 Document Reviewed: 07/16/2012 [facility] Patient Information 2015 [place], [place]. This information is not intended to replace advice given to you by your health care provider. Make sure you discuss any questions you have with your health care provider.

## 2017-09-04 ENCOUNTER — Encounter: Payer: Self-pay | Admitting: Women's Health

## 2017-09-04 ENCOUNTER — Other Ambulatory Visit: Payer: Self-pay | Admitting: Obstetrics & Gynecology

## 2017-09-04 DIAGNOSIS — O0992 Supervision of high risk pregnancy, unspecified, second trimester: Secondary | ICD-10-CM

## 2017-09-04 DIAGNOSIS — O26879 Cervical shortening, unspecified trimester: Secondary | ICD-10-CM

## 2017-09-04 LAB — GC/CHLAMYDIA PROBE AMP (~~LOC~~) NOT AT ARMC
CHLAMYDIA, DNA PROBE: NEGATIVE
Neisseria Gonorrhea: NEGATIVE

## 2017-09-05 ENCOUNTER — Telehealth: Payer: Self-pay | Admitting: Obstetrics and Gynecology

## 2017-09-05 ENCOUNTER — Telehealth: Payer: Self-pay | Admitting: *Deleted

## 2017-09-05 NOTE — Telephone Encounter (Signed)
Boyfriend called after hours nurse with concerns of taking prometrium with her clotting disorder.  States they it is contraindicated with patients who have a clotting disorder.  Please advise.

## 2017-09-05 NOTE — Telephone Encounter (Signed)
Informed Theresa Shaw that per Dr Emelda FearFerguson prometrium was fine for her to take however if they had further questions to talk with the provider.  Asked if she could just be on bedrest and advised that would need to be discussed with the provider.  Please call patient to clear up all questions and concerns.

## 2017-09-06 LAB — URINE CULTURE

## 2017-09-07 ENCOUNTER — Encounter: Payer: Self-pay | Admitting: Women's Health

## 2017-09-07 ENCOUNTER — Other Ambulatory Visit: Payer: Self-pay | Admitting: Women's Health

## 2017-09-07 DIAGNOSIS — R8271 Bacteriuria: Secondary | ICD-10-CM | POA: Insufficient documentation

## 2017-09-07 MED ORDER — AMOXICILLIN 500 MG PO CAPS: 500.0000 mg | ORAL_CAPSULE | Freq: Two times a day (BID) | ORAL | 0 refills | Status: DC

## 2017-09-12 ENCOUNTER — Telehealth: Payer: Self-pay | Admitting: *Deleted

## 2017-09-12 NOTE — Telephone Encounter (Signed)
Informed patient that her urine a showed GBS, so she will need to take Amoxicillin until complete then resume Macrobid nightly.  Verbalized understanding.

## 2017-09-17 ENCOUNTER — Encounter: Payer: Medicaid Other | Admitting: Obstetrics and Gynecology

## 2017-09-17 ENCOUNTER — Encounter (HOSPITAL_COMMUNITY): Payer: Self-pay | Admitting: *Deleted

## 2017-09-17 ENCOUNTER — Inpatient Hospital Stay (HOSPITAL_COMMUNITY)
Admission: AD | Admit: 2017-09-17 | Discharge: 2017-09-17 | Disposition: A | Discharge status: Home / Self Care | Payer: Medicaid Other | Source: Ambulatory Visit | Attending: Family Medicine | Admitting: Family Medicine

## 2017-09-17 DIAGNOSIS — R42 Dizziness and giddiness: Secondary | ICD-10-CM | POA: Insufficient documentation

## 2017-09-17 DIAGNOSIS — Z3A26 26 weeks gestation of pregnancy: Secondary | ICD-10-CM

## 2017-09-17 DIAGNOSIS — F4310 Post-traumatic stress disorder, unspecified: Secondary | ICD-10-CM | POA: Diagnosis not present

## 2017-09-17 DIAGNOSIS — T148XXA Other injury of unspecified body region, initial encounter: Secondary | ICD-10-CM

## 2017-09-17 DIAGNOSIS — Z7901 Long term (current) use of anticoagulants: Secondary | ICD-10-CM | POA: Insufficient documentation

## 2017-09-17 DIAGNOSIS — F319 Bipolar disorder, unspecified: Secondary | ICD-10-CM | POA: Insufficient documentation

## 2017-09-17 DIAGNOSIS — S60221A Contusion of right hand, initial encounter: Secondary | ICD-10-CM | POA: Insufficient documentation

## 2017-09-17 DIAGNOSIS — F419 Anxiety disorder, unspecified: Secondary | ICD-10-CM | POA: Diagnosis not present

## 2017-09-17 DIAGNOSIS — O99342 Other mental disorders complicating pregnancy, second trimester: Secondary | ICD-10-CM | POA: Diagnosis present

## 2017-09-17 DIAGNOSIS — Z881 Allergy status to other antibiotic agents status: Secondary | ICD-10-CM | POA: Insufficient documentation

## 2017-09-17 DIAGNOSIS — M419 Scoliosis, unspecified: Secondary | ICD-10-CM | POA: Diagnosis not present

## 2017-09-17 DIAGNOSIS — Z711 Person with feared health complaint in whom no diagnosis is made: Secondary | ICD-10-CM

## 2017-09-17 DIAGNOSIS — Z86718 Personal history of other venous thrombosis and embolism: Secondary | ICD-10-CM | POA: Diagnosis not present

## 2017-09-17 DIAGNOSIS — O99332 Smoking (tobacco) complicating pregnancy, second trimester: Secondary | ICD-10-CM | POA: Diagnosis not present

## 2017-09-17 DIAGNOSIS — O99352 Diseases of the nervous system complicating pregnancy, second trimester: Secondary | ICD-10-CM | POA: Diagnosis not present

## 2017-09-17 DIAGNOSIS — O26892 Other specified pregnancy related conditions, second trimester: Secondary | ICD-10-CM | POA: Diagnosis present

## 2017-09-17 NOTE — Discharge Instructions (Signed)
Deep Vein Thrombosis Deep vein thrombosis (DVT) is a condition in which a blood clot forms in a deep vein, such as a lower leg, thigh, or arm vein. A clot is blood that has thickened into a gel or solid. This condition is dangerous. It can lead to serious and even life-threatening complications if the clot travels to the lungs and causes a blockage (pulmonary embolism). It can also damage veins in the leg. This can result in leg pain, swelling, discoloration, and sores (post-thrombotic syndrome). What are the causes? This condition may be caused by:  A slowdown of blood flow.  Damage to a vein.  A condition that makes blood clot more easily.  What increases the risk? The following factors may make you more likely to develop this condition:  Being overweight.  Being elderly, especially over age 60.  Sitting or lying down for more than four hours.  Lack of physical activity (sedentary lifestyle).  Being pregnant, giving birth, or having recently given birth.  Taking medicines that contain estrogen.  Smoking.  A history of any of the following: ? Blood clots or blood clotting disease. ? Peripheral vascular disease. ? Inflammatory bowel disease. ? Cancer. ? Heart disease. ? Genetic conditions that affect how blood clots. ? Neurological diseases that affect the legs (leg paresis). ? Injury. ? Major or lengthy surgery. ? A central line placed inside a large vein.  What are the signs or symptoms? Symptoms of this condition include:  Swelling, pain, or tenderness in an arm or leg.  Warmth, redness, or discoloration in an arm or leg.  If the clot is in your leg, symptoms may be more noticeable or worse when you stand or walk. Some people do not have any symptoms. How is this diagnosed? This condition is diagnosed with:  A medical history.  A physical exam.  Tests, such as: ? Blood tests. These are done to see how your blood clots. ? Imaging tests. These are done to  check for clots. Tests may include:  Ultrasound.  CT scan.  MRI.  X-ray.  Venogram. For this test, X-rays are taken after a dye is injected into a vein.  How is this treated? Treatment for this condition depends on the cause, your risk for bleeding or developing more clots, and any medical conditions you have. Treatment may include:  Taking blood thinners (also called anticoagulants). These medicines may be taken by mouth, injected under the skin, or injected through an IV tube (catheter). These medicines prevent clots from forming.  Injecting medicine that dissolves blood clots into the affected vein (catheter-directed thrombolysis).  Having surgery. Surgery may be done to: ? Remove the clot. ? Place a filter in a large vein to catch blood clots before they reach the lungs.  Some treatments may be continued for up to six months. Follow these instructions at home: If you are taking an oral blood thinner:  Take the medicine exactly as told by your health care provider. Some blood thinners need to be taken at the same time every day. Do not skip a dose.  Ask your health care provider about what foods and drugs interact with the medicine.  Ask about possible side effects. General instructions  Blood thinners can cause easy bruising and difficulty stopping bleeding. Because of this, if you are taking or were given a blood thinner: ? Hold pressure over cuts for longer than usual. ? Tell your dentist and other health care providers that you are taking blood thinners before   having any procedures that can cause bleeding. ? Avoid contact sports.  Take over-the-counter and prescription medicines only as told by your health care provider.  Return to your normal activities as told by your health care provider. Ask your health care provider what activities are safe for you.  Wear compression stockings if recommended by your health care provider.  Keep all follow-up visits as told by  your health care provider. This is important. How is this prevented? To lower your risk of developing this condition again:  For 30 or more minutes every day, do an activity that: ? Involves moving your arms and legs. ? Increases your heart rate.  When traveling for longer than four hours: ? Exercise your arms and legs every hour. ? Drink plenty of water. ? Avoid drinking alcohol.  Avoid sitting or lying for a long time without moving your legs.  Stay a healthy weight.  If you are a woman who is older than age 35, avoid unnecessary use of medicines that contain estrogen.  Do not use any products that contain nicotine or tobacco, such as cigarettes and e-cigarettes. This is especially important if you take estrogen medicines. If you need help quitting, ask your health care provider.  Contact a health care provider if:  You miss a dose of your blood thinner.  You have nausea, vomiting, or diarrhea that lasts for more than one day.  Your menstrual period is heavier than usual.  You have unusual bruising. Get help right away if:  You have new or increased pain, swelling, or redness in an arm or leg.  You have numbness or tingling in an arm or leg.  You have shortness of breath.  You have chest pain.  You have a rapid or irregular heartbeat.  You feel light-headed or dizzy.  You cough up blood.  There is blood in your vomit, stool, or urine.  You have a serious fall or accident, or you hit your head.  You have a severe headache or confusion.  You have a cut that will not stop bleeding. These symptoms may represent a serious problem that is an emergency. Do not wait to see if the symptoms will go away. Get medical help right away. Call your local emergency services (911 in the [place] Do not drive yourself to the hospital. Summary  DVT is a condition in which a blood clot forms in a deep vein, such as a lower leg, thigh, or arm vein.  Symptoms can include swelling,  warmth, pain, and redness in your leg or arm.  Treatment may include taking blood thinners, injecting medicine that dissolves blood clots,wearing compression stockings, or surgery.  If you are prescribed blood thinners, take them exactly as told. This information is not intended to replace advice given to you by your health care provider. Make sure you discuss any questions you have with your health care provider. Document Released: 05/15/2005 Document Revised: 06/17/2016 Document Reviewed: 06/17/2016 Elsevier Interactive Patient Education  2018 [employer]  

## 2017-09-17 NOTE — MAU Note (Signed)
Pt reports that she has 3 spots on her R hand. She noticed them yesterday. Pt dizziness since last night. Feeling short of breath since 4 am + FM. Denies LOF or bleeding
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
## 2017-09-18 ENCOUNTER — Encounter: Payer: Medicaid Other | Admitting: Advanced Practice Midwife

## 2017-09-19 ENCOUNTER — Other Ambulatory Visit: Payer: Self-pay | Admitting: Obstetrics & Gynecology

## 2017-09-25 ENCOUNTER — Ambulatory Visit (INDEPENDENT_AMBULATORY_CARE_PROVIDER_SITE_OTHER): Payer: Medicaid Other | Admitting: Women's Health

## 2017-09-25 ENCOUNTER — Encounter: Payer: Self-pay | Admitting: Women's Health

## 2017-09-25 VITALS — BP 100/60 | HR 99

## 2017-09-25 DIAGNOSIS — O9989 Other specified diseases and conditions complicating pregnancy, childbirth and the puerperium: Secondary | ICD-10-CM

## 2017-09-25 DIAGNOSIS — G8220 Paraplegia, unspecified: Secondary | ICD-10-CM

## 2017-09-25 DIAGNOSIS — Z3A27 27 weeks gestation of pregnancy: Secondary | ICD-10-CM

## 2017-09-25 DIAGNOSIS — Z1389 Encounter for screening for other disorder: Secondary | ICD-10-CM

## 2017-09-25 DIAGNOSIS — N319 Neuromuscular dysfunction of bladder, unspecified: Secondary | ICD-10-CM

## 2017-09-25 DIAGNOSIS — O26872 Cervical shortening, second trimester: Secondary | ICD-10-CM

## 2017-09-25 DIAGNOSIS — O09899 Supervision of other high risk pregnancies, unspecified trimester: Secondary | ICD-10-CM

## 2017-09-25 DIAGNOSIS — F17210 Nicotine dependence, cigarettes, uncomplicated: Secondary | ICD-10-CM

## 2017-09-25 DIAGNOSIS — O99352 Diseases of the nervous system complicating pregnancy, second trimester: Secondary | ICD-10-CM

## 2017-09-25 DIAGNOSIS — O0992 Supervision of high risk pregnancy, unspecified, second trimester: Secondary | ICD-10-CM

## 2017-09-25 DIAGNOSIS — O99332 Smoking (tobacco) complicating pregnancy, second trimester: Secondary | ICD-10-CM

## 2017-09-25 DIAGNOSIS — O24410 Gestational diabetes mellitus in pregnancy, diet controlled: Secondary | ICD-10-CM

## 2017-09-25 DIAGNOSIS — O09219 Supervision of pregnancy with history of pre-term labor, unspecified trimester: Secondary | ICD-10-CM

## 2017-09-25 DIAGNOSIS — O26879 Cervical shortening, unspecified trimester: Secondary | ICD-10-CM

## 2017-09-25 DIAGNOSIS — O09212 Supervision of pregnancy with history of pre-term labor, second trimester: Secondary | ICD-10-CM

## 2017-09-25 DIAGNOSIS — Z331 Pregnant state, incidental: Secondary | ICD-10-CM

## 2017-09-25 DIAGNOSIS — Z86718 Personal history of other venous thrombosis and embolism: Secondary | ICD-10-CM

## 2017-09-25 LAB — POCT URINALYSIS DIPSTICK
Blood, UA: NEGATIVE
GLUCOSE UA: NEGATIVE
KETONES UA: NEGATIVE
Leukocytes, UA: NEGATIVE
NITRITE UA: NEGATIVE

## 2017-09-25 MED ORDER — TERCONAZOLE 0.4 % VA CREA: 1.0000 | TOPICAL_CREAM | Freq: Every day | VAGINAL | 0 refills | Status: DC

## 2017-09-25 MED ORDER — PROMETHAZINE HCL 25 MG PO TABS: 25.0000 mg | ORAL_TABLET | Freq: Four times a day (QID) | ORAL | 1 refills | Status: DC | PRN

## 2017-09-25 NOTE — Patient Instructions (Signed)
Theresa Shaw, I greatly value your feedback.  If you receive a survey following your visit with [place] today, we appreciate you taking the time to fill it out.  Thanks, Theresa Shaw, CNM, WHNP-BC   Call the office [phone]) or go to [facility] if:  You begin to have strong, frequent contractions  Your water breaks.  Sometimes it is a big gush of fluid, sometimes it is just a trickle that keeps getting your panties wet or running down your legs  You have vaginal bleeding.  It is normal to have a small amount of spotting if your cervix was checked.   You don't feel your baby moving like normal.  If you don't, get you something to eat and drink and lay down and focus on feeling your baby move.  You should feel at least 10 movements in 2 hours.  If you don't, you should call the office or go to [facility].    Tdap Vaccine  It is recommended that you get the Tdap vaccine during the third trimester of EACH pregnancy to help protect your baby from getting pertussis (whooping cough)  27-36 weeks is the BEST time to do this so that you can pass the protection on to your baby. During pregnancy is better than after pregnancy, but if you are unable to get it during pregnancy it will be offered at the hospital.   You can get this vaccine at the health department or your family doctor  Everyone who will be around your baby should also be up-to-date on their vaccines. Adults (who are not pregnant) only need 1 dose of Tdap during adulthood.   Third Trimester of Pregnancy The third trimester is from week 29 through week 42, months 7 through 9. The third trimester is a time when the fetus is growing rapidly. At the end of the ninth month, the fetus is about 20 inches in length and weighs 6-10 pounds.  BODY CHANGES Your body goes through many changes during pregnancy. The changes vary from woman to woman.   Your weight will continue to increase. You can expect to gain 25-35 pounds (11-16 kg) by  the end of the pregnancy.  You may begin to get stretch marks on your hips, abdomen, and breasts.  You may urinate more often because the fetus is moving lower into your pelvis and pressing on your bladder.  You may develop or continue to have heartburn as a result of your pregnancy.  You may develop constipation because certain hormones are causing the muscles that push waste through your intestines to slow down.  You may develop hemorrhoids or swollen, bulging veins (varicose veins).  You may have pelvic pain because of the weight gain and pregnancy hormones relaxing your joints between the bones in your pelvis. Backaches may result from overexertion of the muscles supporting your posture.  You may have changes in your hair. These can include thickening of your hair, rapid growth, and changes in texture. Some women also have hair loss during or after pregnancy, or hair that feels dry or thin. Your hair will most likely return to normal after your baby is born.  Your breasts will continue to grow and be tender. A yellow discharge may leak from your breasts called colostrum.  Your belly button may stick out.  You may feel short of breath because of your expanding uterus.  You may notice the fetus "dropping," or moving lower in your abdomen.  You may have a bloody  mucus discharge. This usually occurs a few days to a week before labor begins.  Your cervix becomes thin and soft (effaced) near your due date. WHAT TO EXPECT AT YOUR PRENATAL EXAMS  You will have prenatal exams every 2 weeks until week 36. Then, you will have weekly prenatal exams. During a routine prenatal visit:  You will be weighed to make sure you and the fetus are growing normally.  Your blood pressure is taken.  Your abdomen will be measured to track your baby's growth.  The fetal heartbeat will be listened to.  Any test results from the previous visit will be discussed.  You may have a cervical check near your  due date to see if you have effaced. At around 36 weeks, your caregiver will check your cervix. At the same time, your caregiver will also perform a test on the secretions of the vaginal tissue. This test is to determine if a type of bacteria, Group B streptococcus, is present. Your caregiver will explain this further. Your caregiver may ask you:  What your birth plan is.  How you are feeling.  If you are feeling the baby move.  If you have had any abnormal symptoms, such as leaking fluid, bleeding, severe headaches, or abdominal cramping.  If you have any questions. Other tests or screenings that may be performed during your third trimester include:  Blood tests that check for low iron levels (anemia).  Fetal testing to check the health, activity level, and growth of the fetus. Testing is done if you have certain medical conditions or if there are problems during the pregnancy. FALSE LABOR You may feel small, irregular contractions that eventually go away. These are called Braxton Hicks contractions, or false labor. Contractions may last for hours, days, or even weeks before true labor sets in. If contractions come at regular intervals, intensify, or become painful, it is best to be seen by your caregiver.  SIGNS OF LABOR   Menstrual-like cramps.  Contractions that are 5 minutes apart or less.  Contractions that start on the top of the uterus and spread down to the lower abdomen and back.  A sense of increased pelvic pressure or back pain.  A watery or bloody mucus discharge that comes from the vagina. If you have any of these signs before the 37th week of pregnancy, call your caregiver right away. You need to go to the hospital to get checked immediately. HOME CARE INSTRUCTIONS   Avoid all smoking, herbs, alcohol, and unprescribed drugs. These chemicals affect the formation and growth of the baby.  Follow your caregiver's instructions regarding medicine use. There are medicines  that are either safe or unsafe to take during pregnancy.  Exercise only as directed by your caregiver. Experiencing uterine cramps is a good sign to stop exercising.  Continue to eat regular, healthy meals.  Wear a good support bra for breast tenderness.  Do not use hot tubs, steam rooms, or saunas.  Wear your seat belt at all times when driving.  Avoid raw meat, uncooked cheese, cat litter boxes, and soil used by cats. These carry germs that can cause birth defects in the baby.  Take your prenatal vitamins.  Try taking a stool softener (if your caregiver approves) if you develop constipation. Eat more high-fiber foods, such as fresh vegetables or fruit and whole grains. Drink plenty of fluids to keep your urine clear or pale yellow.  Take warm sitz baths to soothe any pain or discomfort caused by hemorrhoids.  Use hemorrhoid cream if your caregiver approves.  If you develop varicose veins, wear support hose. Elevate your feet for 15 minutes, 3-4 times a day. Limit salt in your diet.  Avoid heavy lifting, wear low heal shoes, and practice good posture.  Rest a lot with your legs elevated if you have leg cramps or low back pain.  Visit your dentist if you have not gone during your pregnancy. Use a soft toothbrush to brush your teeth and be gentle when you floss.  A sexual relationship may be continued unless your caregiver directs you otherwise.  Do not travel far distances unless it is absolutely necessary and only with the approval of your caregiver.  Take prenatal classes to understand, practice, and ask questions about the labor and delivery.  Make a trial run to the hospital.  Pack your hospital bag.  Prepare the baby's nursery.  Continue to go to all your prenatal visits as directed by your caregiver. SEEK MEDICAL CARE IF:  You are unsure if you are in labor or if your water has broken.  You have dizziness.  You have mild pelvic cramps, pelvic pressure, or nagging  pain in your abdominal area.  You have persistent nausea, vomiting, or diarrhea.  You have a bad smelling vaginal discharge.  You have pain with urination. SEEK IMMEDIATE MEDICAL CARE IF:   You have a fever.  You are leaking fluid from your vagina.  You have spotting or bleeding from your vagina.  You have severe abdominal cramping or pain.  You have rapid weight loss or gain.  You have shortness of breath with chest pain.  You notice sudden or extreme swelling of your face, hands, ankles, feet, or legs.  You have not felt your baby move in over an hour.  You have severe headaches that do not go away with medicine.  You have vision changes. Document Released: 05/09/2001 Document Revised: 05/20/2013 Document Reviewed: 07/16/2012 [employer] Patient Information 2015 [employer]. This information is not intended to replace advice given to you by your health care provider. Make sure you discuss any questions you have with your health care provider.   
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
## 2017-09-27 ENCOUNTER — Other Ambulatory Visit: Payer: Self-pay | Admitting: Adult Health

## 2017-10-03 ENCOUNTER — Ambulatory Visit (INDEPENDENT_AMBULATORY_CARE_PROVIDER_SITE_OTHER): Payer: Medicaid Other | Admitting: *Deleted

## 2017-10-03 DIAGNOSIS — O0993 Supervision of high risk pregnancy, unspecified, third trimester: Secondary | ICD-10-CM

## 2017-10-03 LAB — OBSTETRIC PANEL, INCLUDING HIV
BASOS: 0 %
Basophils Absolute: 0 10*3/uL (ref 0.0–0.2)
EOS (ABSOLUTE): 0.3 10*3/uL (ref 0.0–0.4)
Eos: 2 %
HEP B S AG: NEGATIVE
HIV SCREEN 4TH GENERATION: NONREACTIVE
Hematocrit: 32.7 % — ABNORMAL LOW (ref 34.0–46.6)
Hemoglobin: 10.7 g/dL — ABNORMAL LOW (ref 11.1–15.9)
Immature Grans (Abs): 0 10*3/uL (ref 0.0–0.1)
Immature Granulocytes: 0 %
Lymphocytes Absolute: 1.8 10*3/uL (ref 0.7–3.1)
Lymphs: 13 %
MCH: 29.2 pg (ref 26.6–33.0)
MCHC: 32.7 g/dL (ref 31.5–35.7)
MCV: 89 fL (ref 79–97)
MONOCYTES: 5 %
Monocytes Absolute: 0.7 10*3/uL (ref 0.1–0.9)
NEUTROS ABS: 11.5 10*3/uL — AB (ref 1.4–7.0)
Neutrophils: 80 %
Platelets: 255 10*3/uL (ref 150–379)
RBC: 3.67 x10E6/uL — ABNORMAL LOW (ref 3.77–5.28)
RDW: 13.1 % (ref 12.3–15.4)
RH TYPE: NEGATIVE
RPR: NONREACTIVE
RUBELLA: 1.71 {index} (ref >0.99)
WBC: 14.3 10*3/uL — ABNORMAL HIGH (ref 3.4–10.8)

## 2017-10-03 LAB — AB SCR+ANTIBODY ID

## 2017-10-03 NOTE — Progress Notes (Signed)
Pt here for Rhogam. Advised pt that antibody screen results are not back yet. Appt made for next week.

## 2017-10-08 ENCOUNTER — Encounter: Payer: Self-pay | Admitting: *Deleted

## 2017-10-08 ENCOUNTER — Encounter: Payer: Self-pay | Admitting: Women's Health

## 2017-10-08 ENCOUNTER — Other Ambulatory Visit: Payer: Self-pay | Admitting: Women's Health

## 2017-10-08 ENCOUNTER — Ambulatory Visit (INDEPENDENT_AMBULATORY_CARE_PROVIDER_SITE_OTHER): Payer: Medicaid Other | Admitting: *Deleted

## 2017-10-08 VITALS — BP 100/60 | HR 124

## 2017-10-08 DIAGNOSIS — O26893 Other specified pregnancy related conditions, third trimester: Secondary | ICD-10-CM | POA: Diagnosis not present

## 2017-10-08 DIAGNOSIS — O0990 Supervision of high risk pregnancy, unspecified, unspecified trimester: Secondary | ICD-10-CM

## 2017-10-08 DIAGNOSIS — O26899 Other specified pregnancy related conditions, unspecified trimester: Secondary | ICD-10-CM

## 2017-10-08 DIAGNOSIS — Z6791 Unspecified blood type, Rh negative: Secondary | ICD-10-CM | POA: Diagnosis not present

## 2017-10-08 MED ORDER — FERROUS SULFATE 325 (65 FE) MG PO TABS: 325.0000 mg | ORAL_TABLET | Freq: Two times a day (BID) | ORAL | 3 refills | Status: DC

## 2017-10-08 NOTE — Progress Notes (Signed)
Pt here for Rhogam. Shot given in right hip. Pt tolerated shot well. Return at scheduled visit. JSY

## 2017-10-15 ENCOUNTER — Other Ambulatory Visit: Payer: Self-pay | Admitting: Women's Health

## 2017-10-15 DIAGNOSIS — O99320 Drug use complicating pregnancy, unspecified trimester: Secondary | ICD-10-CM

## 2017-10-15 DIAGNOSIS — F1120 Opioid dependence, uncomplicated: Secondary | ICD-10-CM

## 2017-10-15 DIAGNOSIS — O24410 Gestational diabetes mellitus in pregnancy, diet controlled: Secondary | ICD-10-CM

## 2017-10-16 ENCOUNTER — Ambulatory Visit (INDEPENDENT_AMBULATORY_CARE_PROVIDER_SITE_OTHER): Payer: Medicaid Other

## 2017-10-16 ENCOUNTER — Encounter: Payer: Self-pay | Admitting: Advanced Practice Midwife

## 2017-10-16 ENCOUNTER — Ambulatory Visit (INDEPENDENT_AMBULATORY_CARE_PROVIDER_SITE_OTHER): Payer: Medicaid Other | Admitting: Advanced Practice Midwife

## 2017-10-16 VITALS — BP 110/60 | HR 111

## 2017-10-16 DIAGNOSIS — O99343 Other mental disorders complicating pregnancy, third trimester: Secondary | ICD-10-CM | POA: Diagnosis not present

## 2017-10-16 DIAGNOSIS — O24410 Gestational diabetes mellitus in pregnancy, diet controlled: Secondary | ICD-10-CM

## 2017-10-16 DIAGNOSIS — G8220 Paraplegia, unspecified: Secondary | ICD-10-CM

## 2017-10-16 DIAGNOSIS — N319 Neuromuscular dysfunction of bladder, unspecified: Secondary | ICD-10-CM

## 2017-10-16 DIAGNOSIS — O99323 Drug use complicating pregnancy, third trimester: Secondary | ICD-10-CM

## 2017-10-16 DIAGNOSIS — Z3A30 30 weeks gestation of pregnancy: Secondary | ICD-10-CM

## 2017-10-16 DIAGNOSIS — F418 Other specified anxiety disorders: Secondary | ICD-10-CM

## 2017-10-16 DIAGNOSIS — O09899 Supervision of other high risk pregnancies, unspecified trimester: Secondary | ICD-10-CM

## 2017-10-16 DIAGNOSIS — O26879 Cervical shortening, unspecified trimester: Secondary | ICD-10-CM

## 2017-10-16 DIAGNOSIS — O09213 Supervision of pregnancy with history of pre-term labor, third trimester: Secondary | ICD-10-CM | POA: Diagnosis not present

## 2017-10-16 DIAGNOSIS — O0990 Supervision of high risk pregnancy, unspecified, unspecified trimester: Secondary | ICD-10-CM

## 2017-10-16 DIAGNOSIS — Z23 Encounter for immunization: Secondary | ICD-10-CM | POA: Diagnosis not present

## 2017-10-16 DIAGNOSIS — F1120 Opioid dependence, uncomplicated: Secondary | ICD-10-CM

## 2017-10-16 DIAGNOSIS — R768 Other specified abnormal immunological findings in serum: Secondary | ICD-10-CM

## 2017-10-16 DIAGNOSIS — O99353 Diseases of the nervous system complicating pregnancy, third trimester: Secondary | ICD-10-CM | POA: Diagnosis not present

## 2017-10-16 DIAGNOSIS — O99320 Drug use complicating pregnancy, unspecified trimester: Secondary | ICD-10-CM

## 2017-10-16 DIAGNOSIS — O26873 Cervical shortening, third trimester: Secondary | ICD-10-CM

## 2017-10-16 DIAGNOSIS — O09219 Supervision of pregnancy with history of pre-term labor, unspecified trimester: Secondary | ICD-10-CM

## 2017-10-16 NOTE — Patient Instructions (Signed)
Check Blood sugar fasting and 2 hours after meals. Write down time of day and results

## 2017-10-16 NOTE — Progress Notes (Signed)
[place] 30+3 wks,cephalic,afi 13 cm,anterior pl gr 0,fhr 123 bpm
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
## 2017-10-25 ENCOUNTER — Telehealth: Payer: Self-pay | Admitting: Obstetrics & Gynecology

## 2017-10-25 MED ORDER — PROMETHAZINE HCL 25 MG PO TABS: 25.0000 mg | ORAL_TABLET | Freq: Four times a day (QID) | ORAL | 0 refills | Status: DC | PRN

## 2017-10-25 MED ORDER — CLONAZEPAM 0.5 MG PO TABS: 0.5000 mg | ORAL_TABLET | Freq: Two times a day (BID) | ORAL | 0 refills | Status: DC | PRN

## 2017-10-28 ENCOUNTER — Encounter (HOSPITAL_COMMUNITY): Payer: Self-pay

## 2017-10-28 ENCOUNTER — Inpatient Hospital Stay (HOSPITAL_COMMUNITY)
Admission: AD | Admit: 2017-10-28 | Discharge: 2017-10-28 | Disposition: A | Discharge status: Home / Self Care | Payer: Medicaid Other | Source: Ambulatory Visit | Attending: Family Medicine | Admitting: Family Medicine

## 2017-10-28 DIAGNOSIS — Z3A32 32 weeks gestation of pregnancy: Secondary | ICD-10-CM | POA: Diagnosis not present

## 2017-10-28 DIAGNOSIS — G8220 Paraplegia, unspecified: Secondary | ICD-10-CM | POA: Insufficient documentation

## 2017-10-28 DIAGNOSIS — F4310 Post-traumatic stress disorder, unspecified: Secondary | ICD-10-CM | POA: Diagnosis not present

## 2017-10-28 DIAGNOSIS — Z800 Family history of malignant neoplasm of digestive organs: Secondary | ICD-10-CM | POA: Diagnosis not present

## 2017-10-28 DIAGNOSIS — O99353 Diseases of the nervous system complicating pregnancy, third trimester: Secondary | ICD-10-CM | POA: Diagnosis present

## 2017-10-28 DIAGNOSIS — N760 Acute vaginitis: Secondary | ICD-10-CM

## 2017-10-28 DIAGNOSIS — B9689 Other specified bacterial agents as the cause of diseases classified elsewhere: Secondary | ICD-10-CM

## 2017-10-28 DIAGNOSIS — F319 Bipolar disorder, unspecified: Secondary | ICD-10-CM | POA: Insufficient documentation

## 2017-10-28 DIAGNOSIS — F17210 Nicotine dependence, cigarettes, uncomplicated: Secondary | ICD-10-CM | POA: Insufficient documentation

## 2017-10-28 DIAGNOSIS — Z8249 Family history of ischemic heart disease and other diseases of the circulatory system: Secondary | ICD-10-CM | POA: Insufficient documentation

## 2017-10-28 DIAGNOSIS — B373 Candidiasis of vulva and vagina: Secondary | ICD-10-CM | POA: Diagnosis not present

## 2017-10-28 DIAGNOSIS — Z86718 Personal history of other venous thrombosis and embolism: Secondary | ICD-10-CM | POA: Diagnosis not present

## 2017-10-28 DIAGNOSIS — O99333 Smoking (tobacco) complicating pregnancy, third trimester: Secondary | ICD-10-CM | POA: Insufficient documentation

## 2017-10-28 DIAGNOSIS — Z833 Family history of diabetes mellitus: Secondary | ICD-10-CM | POA: Insufficient documentation

## 2017-10-28 DIAGNOSIS — O99343 Other mental disorders complicating pregnancy, third trimester: Secondary | ICD-10-CM | POA: Insufficient documentation

## 2017-10-28 DIAGNOSIS — Z9889 Other specified postprocedural states: Secondary | ICD-10-CM | POA: Insufficient documentation

## 2017-10-28 DIAGNOSIS — B3731 Acute candidiasis of vulva and vagina: Secondary | ICD-10-CM

## 2017-10-28 DIAGNOSIS — Z811 Family history of alcohol abuse and dependence: Secondary | ICD-10-CM | POA: Insufficient documentation

## 2017-10-28 LAB — WET PREP, GENITAL
Sperm: NONE SEEN
Trich, Wet Prep: NONE SEEN

## 2017-10-28 LAB — AMNISURE RUPTURE OF MEMBRANE (ROM) NOT AT ARMC: AMNISURE: NEGATIVE

## 2017-10-28 MED ORDER — FLUCONAZOLE 200 MG PO TABS: 200.0000 mg | ORAL_TABLET | Freq: Every day | ORAL | 0 refills | Status: AC

## 2017-10-28 MED ORDER — METRONIDAZOLE 500 MG PO TABS: 500.0000 mg | ORAL_TABLET | Freq: Two times a day (BID) | ORAL | 0 refills | Status: DC

## 2017-10-28 NOTE — MAU Note (Signed)
Leaking since last night, unsure if ROM or discharge. Some irregular ctx, some decreased movement. No bleeding

## 2017-10-28 NOTE — Discharge Instructions (Signed)
Bacterial Vaginosis Bacterial vaginosis is an infection of the vagina. It happens when too many germs (bacteria) grow in the vagina. This infection puts you at risk for infections from sex (STIs). Treating this infection can lower your risk for some STIs. You should also treat this if you are pregnant. It can cause your baby to be born early. Follow these instructions at home: Medicines  Take over-the-counter and prescription medicines only as told by your doctor.  Take or use your antibiotic medicine as told by your doctor. Do not stop taking or using it even if you start to feel better. General instructions  If you your sexual partner is a woman, tell her that you have this infection. She needs to get treatment if she has symptoms. If you have a female partner, he does not need to be treated.  During treatment: ? Avoid sex. ? Do not douche. ? Avoid alcohol as told. ? Avoid breastfeeding as told.  Drink enough fluid to keep your pee (urine) clear or pale yellow.  Keep your vagina and butt (rectum) clean. ? Wash the area with warm water every day. ? Wipe from front to back after you use the toilet.  Keep all follow-up visits as told by your doctor. This is important. Preventing this condition  Do not douche.  Use only warm water to wash around your vagina.  Use protection when you have sex. This includes: ? Latex condoms. ? Dental dams.  Limit how many people you have sex with. It is best to only have sex with the same person (be monogamous).  Get tested for STIs. Have your partner get tested.  Wear underwear that is cotton or lined with cotton.  Avoid tight pants and pantyhose. This is most important in summer.  Do not use any products that have nicotine or tobacco in them. These include cigarettes and e-cigarettes. If you need help quitting, ask your doctor.  Do not use illegal drugs.  Limit how much alcohol you drink. Contact a doctor if:  Your symptoms do not get  better, even after you are treated.  You have more discharge or pain when you pee (urinate).  You have a fever.  You have pain in your belly (abdomen).  You have pain with sex.  Your bleed from your vagina between periods. Summary  This infection happens when too many germs (bacteria) grow in the vagina.  Treating this condition can lower your risk for some infections from sex (STIs).  You should also treat this if you are pregnant. It can cause early (premature) birth.  Do not stop taking or using your antibiotic medicine even if you start to feel better. This information is not intended to replace advice given to you by your health care provider. Make sure you discuss any questions you have with your health care provider. Document Released: 02/22/2008 Document Revised: 01/29/2016 Document Reviewed: 01/29/2016 Elsevier Interactive Patient Education  2017 Elsevier Inc. Vaginal Yeast infection, Adult Vaginal yeast infection is a condition that causes soreness, swelling, and redness (inflammation) of the vagina. It also causes vaginal discharge. This is a common condition. Some women get this infection frequently. What are the causes? This condition is caused by a change in the normal balance of the yeast (candida) and bacteria that live in the vagina. This change causes an overgrowth of yeast, which causes the inflammation. What increases the risk? This condition is more likely to develop in:  Women who take antibiotic medicines.  Women who have   diabetes.  Women who take birth control pills.  Women who are pregnant.  Women who douche often.  Women who have a weak defense (immune) system.  Women who have been taking steroid medicines for a long time.  Women who frequently wear tight clothing.  What are the signs or symptoms? Symptoms of this condition include:  White, thick vaginal discharge.  Swelling, itching, redness, and irritation of the vagina. The lips of the  vagina (vulva) may be affected as well.  Pain or a burning feeling while urinating.  Pain during sex.  How is this diagnosed? This condition is diagnosed with a medical history and physical exam. This will include a pelvic exam. Your health care provider will examine a sample of your vaginal discharge under a microscope. Your health care provider may send this sample for testing to confirm the diagnosis. How is this treated? This condition is treated with medicine. Medicines may be over-the-counter or prescription. You may be told to use one or more of the following:  Medicine that is taken orally.  Medicine that is applied as a cream.  Medicine that is inserted directly into the vagina (suppository).  Follow these instructions at home:  Take or apply over-the-counter and prescription medicines only as told by your health care provider.  Do not have sex until your health care provider has approved. Tell your sex partner that you have a yeast infection. That person should go to his or her health care provider if he or she develops symptoms.  Do not wear tight clothes, such as pantyhose or tight pants.  Avoid using tampons until your health care provider approves.  Eat more yogurt. This may help to keep your yeast infection from returning.  Try taking a sitz bath to help with discomfort. This is a warm water bath that is taken while you are sitting down. The water should only come up to your hips and should cover your buttocks. Do this 3-4 times per day or as told by your health care provider.  Do not douche.  Wear breathable, cotton underwear.  If you have diabetes, keep your blood sugar levels under control. Contact a health care provider if:  You have a fever.  Your symptoms go away and then return.  Your symptoms do not get better with treatment.  Your symptoms get worse.  You have new symptoms.  You develop blisters in or around your vagina.  You have blood coming  from your vagina and it is not your menstrual period.  You develop pain in your abdomen. This information is not intended to replace advice given to you by your health care provider. Make sure you discuss any questions you have with your health care provider. Document Released: 02/22/2005 Document Revised: 10/27/2015 Document Reviewed: 11/16/2014 Elsevier Interactive Patient Education  2018 Elsevier Inc.  
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
## 2017-10-29 LAB — GC/CHLAMYDIA PROBE AMP (~~LOC~~) NOT AT ARMC
CHLAMYDIA, DNA PROBE: NEGATIVE
Neisseria Gonorrhea: NEGATIVE

## 2017-10-30 ENCOUNTER — Encounter: Payer: Self-pay | Admitting: Advanced Practice Midwife

## 2017-10-30 ENCOUNTER — Ambulatory Visit (INDEPENDENT_AMBULATORY_CARE_PROVIDER_SITE_OTHER): Payer: Medicaid Other | Admitting: Advanced Practice Midwife

## 2017-10-30 VITALS — BP 110/60 | HR 98 | Temp 98.6°F

## 2017-10-30 DIAGNOSIS — Z1389 Encounter for screening for other disorder: Secondary | ICD-10-CM

## 2017-10-30 DIAGNOSIS — Z331 Pregnant state, incidental: Secondary | ICD-10-CM

## 2017-10-30 DIAGNOSIS — O0993 Supervision of high risk pregnancy, unspecified, third trimester: Secondary | ICD-10-CM

## 2017-10-30 DIAGNOSIS — O26873 Cervical shortening, third trimester: Secondary | ICD-10-CM

## 2017-10-30 DIAGNOSIS — O24410 Gestational diabetes mellitus in pregnancy, diet controlled: Secondary | ICD-10-CM

## 2017-10-30 DIAGNOSIS — O09213 Supervision of pregnancy with history of pre-term labor, third trimester: Secondary | ICD-10-CM

## 2017-10-30 DIAGNOSIS — G8220 Paraplegia, unspecified: Secondary | ICD-10-CM

## 2017-10-30 DIAGNOSIS — N319 Neuromuscular dysfunction of bladder, unspecified: Secondary | ICD-10-CM

## 2017-10-30 DIAGNOSIS — F1120 Opioid dependence, uncomplicated: Secondary | ICD-10-CM

## 2017-10-30 DIAGNOSIS — O99343 Other mental disorders complicating pregnancy, third trimester: Secondary | ICD-10-CM

## 2017-10-30 DIAGNOSIS — O99333 Smoking (tobacco) complicating pregnancy, third trimester: Secondary | ICD-10-CM

## 2017-10-30 DIAGNOSIS — O99323 Drug use complicating pregnancy, third trimester: Secondary | ICD-10-CM

## 2017-10-30 DIAGNOSIS — F418 Other specified anxiety disorders: Secondary | ICD-10-CM

## 2017-10-30 DIAGNOSIS — O99353 Diseases of the nervous system complicating pregnancy, third trimester: Secondary | ICD-10-CM

## 2017-10-30 DIAGNOSIS — F17210 Nicotine dependence, cigarettes, uncomplicated: Secondary | ICD-10-CM

## 2017-10-30 DIAGNOSIS — N3000 Acute cystitis without hematuria: Secondary | ICD-10-CM

## 2017-10-30 LAB — POCT URINALYSIS DIPSTICK
Blood, UA: NEGATIVE
Glucose, UA: NEGATIVE
KETONES UA: NEGATIVE
Leukocytes, UA: NEGATIVE
Protein, UA: POSITIVE — AB

## 2017-10-30 NOTE — Patient Instructions (Signed)
Allyne GeeHannah B Walter, I greatly value your feedback.  If you receive a survey following your visit with us today, we appreciate you taking the time to fill it out.  Thanks, Cathie BeamsFran Cresenzo-Dishmon, CNM   Call the office [phone]) or go to [facility] if:  You begin to have strong, frequent contractions  Your water breaks.  Sometimes it is a big gush of fluid, sometimes it is just a trickle that keeps getting your panties wet or running down your legs  You have vaginal bleeding.  It is normal to have a small amount of spotting if your cervix was checked.   You don't feel your baby moving like normal.  If you don't, get you something to eat and drink and lay down and focus on feeling your baby move.  You should feel at least 10 movements in 2 hours.  If you don't, you should call the office or go to [facility].    Tdap Vaccine  It is recommended that you get the Tdap vaccine during the third trimester of EACH pregnancy to help protect your baby from getting pertussis (whooping cough)  27-36 weeks is the BEST time to do this so that you can pass the protection on to your baby. During pregnancy is better than after pregnancy, but if you are unable to get it during pregnancy it will be offered at the hospital.   You can get this vaccine at the health department or your family doctor  Everyone who will be around your baby should also be up-to-date on their vaccines. Adults (who are not pregnant) only need 1 dose of Tdap during adulthood.   Third Trimester of Pregnancy The third trimester is from week 29 through week 42, months 7 through 9. The third trimester is a time when the fetus is growing rapidly. At the end of the ninth month, the fetus is about 20 inches in length and weighs 6-10 pounds.  BODY CHANGES Your body goes through many changes during pregnancy. The changes vary from woman to woman.   Your weight will continue to increase. You can expect to gain 25-35 pounds (11-16 kg)  by the end of the pregnancy.  You may begin to get stretch marks on your hips, abdomen, and breasts.  You may urinate more often because the fetus is moving lower into your pelvis and pressing on your bladder.  You may develop or continue to have heartburn as a result of your pregnancy.  You may develop constipation because certain hormones are causing the muscles that push waste through your intestines to slow down.  You may develop hemorrhoids or swollen, bulging veins (varicose veins).  You may have pelvic pain because of the weight gain and pregnancy hormones relaxing your joints between the bones in your pelvis. Backaches may result from overexertion of the muscles supporting your posture.  You may have changes in your hair. These can include thickening of your hair, rapid growth, and changes in texture. Some women also have hair loss during or after pregnancy, or hair that feels dry or thin. Your hair will most likely return to normal after your baby is born.  Your breasts will continue to grow and be tender. A yellow discharge may leak from your breasts called colostrum.  Your belly button may stick out.  You may feel short of breath because of your expanding uterus.  You may notice the fetus "dropping," or moving lower in your abdomen.  You may have a bloody mucus  discharge. This usually occurs a few days to a week before labor begins.  Your cervix becomes thin and soft (effaced) near your due date. WHAT TO EXPECT AT YOUR PRENATAL EXAMS  You will have prenatal exams every 2 weeks until week 36. Then, you will have weekly prenatal exams. During a routine prenatal visit:  You will be weighed to make sure you and the fetus are growing normally.  Your blood pressure is taken.  Your abdomen will be measured to track your baby's growth.  The fetal heartbeat will be listened to.  Any test results from the previous visit will be discussed.  You may have a cervical check near  your due date to see if you have effaced. At around 36 weeks, your caregiver will check your cervix. At the same time, your caregiver will also perform a test on the secretions of the vaginal tissue. This test is to determine if a type of bacteria, Group B streptococcus, is present. Your caregiver will explain this further. Your caregiver may ask you:  What your birth plan is.  How you are feeling.  If you are feeling the baby move.  If you have had any abnormal symptoms, such as leaking fluid, bleeding, severe headaches, or abdominal cramping.  If you have any questions. Other tests or screenings that may be performed during your third trimester include:  Blood tests that check for low iron levels (anemia).  Fetal testing to check the health, activity level, and growth of the fetus. Testing is done if you have certain medical conditions or if there are problems during the pregnancy. FALSE LABOR You may feel small, irregular contractions that eventually go away. These are called Braxton Hicks contractions, or false labor. Contractions may last for hours, days, or even weeks before true labor sets in. If contractions come at regular intervals, intensify, or become painful, it is best to be seen by your caregiver.  SIGNS OF LABOR   Menstrual-like cramps.  Contractions that are 5 minutes apart or less.  Contractions that start on the top of the uterus and spread down to the lower abdomen and back.  A sense of increased pelvic pressure or back pain.  A watery or bloody mucus discharge that comes from the vagina. If you have any of these signs before the 37th week of pregnancy, call your caregiver right away. You need to go to the hospital to get checked immediately. HOME CARE INSTRUCTIONS   Avoid all smoking, herbs, alcohol, and unprescribed drugs. These chemicals affect the formation and growth of the baby.  Follow your caregiver's instructions regarding medicine use. There are  medicines that are either safe or unsafe to take during pregnancy.  Exercise only as directed by your caregiver. Experiencing uterine cramps is a good sign to stop exercising.  Continue to eat regular, healthy meals.  Wear a good support bra for breast tenderness.  Do not use hot tubs, steam rooms, or saunas.  Wear your seat belt at all times when driving.  Avoid raw meat, uncooked cheese, cat litter boxes, and soil used by cats. These carry germs that can cause birth defects in the baby.  Take your prenatal vitamins.  Try taking a stool softener (if your caregiver approves) if you develop constipation. Eat more high-fiber foods, such as fresh vegetables or fruit and whole grains. Drink plenty of fluids to keep your urine clear or pale yellow.  Take warm sitz baths to soothe any pain or discomfort caused by hemorrhoids. Use  hemorrhoid cream if your caregiver approves.  If you develop varicose veins, wear support hose. Elevate your feet for 15 minutes, 3-4 times a day. Limit salt in your diet.  Avoid heavy lifting, wear low heal shoes, and practice good posture.  Rest a lot with your legs elevated if you have leg cramps or low back pain.  Visit your dentist if you have not gone during your pregnancy. Use a soft toothbrush to brush your teeth and be gentle when you floss.  A sexual relationship may be continued unless your caregiver directs you otherwise.  Do not travel far distances unless it is absolutely necessary and only with the approval of your caregiver.  Take prenatal classes to understand, practice, and ask questions about the labor and delivery.  Make a trial run to the hospital.  Pack your hospital bag.  Prepare the baby's nursery.  Continue to go to all your prenatal visits as directed by your caregiver. SEEK MEDICAL CARE IF:  You are unsure if you are in labor or if your water has broken.  You have dizziness.  You have mild pelvic cramps, pelvic pressure, or  nagging pain in your abdominal area.  You have persistent nausea, vomiting, or diarrhea.  You have a bad smelling vaginal discharge.  You have pain with urination. SEEK IMMEDIATE MEDICAL CARE IF:   You have a fever.  You are leaking fluid from your vagina.  You have spotting or bleeding from your vagina.  You have severe abdominal cramping or pain.  You have rapid weight loss or gain.  You have shortness of breath with chest pain.  You notice sudden or extreme swelling of your face, hands, ankles, feet, or legs.  You have not felt your baby move in over an hour.  You have severe headaches that do not go away with medicine.  You have vision changes. Document Released: 05/09/2001 Document Revised: 05/20/2013 Document Reviewed: 07/16/2012 [facility] Patient Information 2015 [place], [place]. This information is not intended to replace advice given to you by your health care provider. Make sure you discuss any questions you have with your health care provider.
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
## 2017-10-31 LAB — MED LIST OPTION NOT SELECTED

## 2017-10-31 LAB — PMP SCREEN PROFILE (10S), URINE
AMPHETAMINE SCREEN URINE: NEGATIVE ng/mL
BARBITURATE SCREEN URINE: NEGATIVE ng/mL
BENZODIAZEPINE SCREEN, URINE: NEGATIVE ng/mL
CANNABINOIDS UR QL SCN: NEGATIVE ng/mL
Cocaine (Metab) Scrn, Ur: NEGATIVE ng/mL
Creatinine(Crt), U: 99.1 mg/dL (ref 20.0–300.0)
Methadone Screen, Urine: NEGATIVE ng/mL
OPIATE SCREEN URINE: NEGATIVE ng/mL
OXYCODONE+OXYMORPHONE UR QL SCN: NEGATIVE ng/mL
PHENCYCLIDINE QUANTITATIVE URINE: NEGATIVE ng/mL
Ph of Urine: 7.3 (ref 4.5–8.9)
Propoxyphene Scrn, Ur: NEGATIVE ng/mL

## 2017-11-01 ENCOUNTER — Other Ambulatory Visit: Payer: Self-pay | Admitting: Advanced Practice Midwife

## 2017-11-01 LAB — URINE CULTURE

## 2017-11-01 MED ORDER — CEPHALEXIN 500 MG PO CAPS: 500.0000 mg | ORAL_CAPSULE | Freq: Every day | ORAL | 3 refills | Status: DC

## 2017-11-01 MED ORDER — SULFAMETHOXAZOLE-TRIMETHOPRIM 800-160 MG PO TABS: 1.0000 | ORAL_TABLET | Freq: Two times a day (BID) | ORAL | 0 refills | Status: DC

## 2017-11-01 NOTE — Progress Notes (Signed)
E coli resistant to macrobid. Rx septra DS BID (easier compliance) and then keflex qhs

## 2017-11-08 ENCOUNTER — Encounter (HOSPITAL_COMMUNITY): Payer: Self-pay

## 2017-11-08 NOTE — Progress Notes (Signed)
Attempted to contact patient at all numbers available on chart. Unable to leave a message. Please have patient call [phone] to set up a NICU NAS tour.

## 2017-11-12 ENCOUNTER — Inpatient Hospital Stay (HOSPITAL_COMMUNITY)
Admission: AD | Admit: 2017-11-12 | Discharge: 2017-11-12 | Disposition: A | Discharge status: Home / Self Care | Payer: Medicaid Other | Source: Ambulatory Visit | Attending: Obstetrics and Gynecology | Admitting: Obstetrics and Gynecology

## 2017-11-12 DIAGNOSIS — O4703 False labor before 37 completed weeks of gestation, third trimester: Secondary | ICD-10-CM | POA: Insufficient documentation

## 2017-11-12 DIAGNOSIS — R519 Headache, unspecified: Secondary | ICD-10-CM

## 2017-11-12 DIAGNOSIS — Z3A34 34 weeks gestation of pregnancy: Secondary | ICD-10-CM | POA: Insufficient documentation

## 2017-11-12 DIAGNOSIS — O26893 Other specified pregnancy related conditions, third trimester: Secondary | ICD-10-CM | POA: Diagnosis present

## 2017-11-12 DIAGNOSIS — R51 Headache: Secondary | ICD-10-CM | POA: Diagnosis present

## 2017-11-12 DIAGNOSIS — K0889 Other specified disorders of teeth and supporting structures: Secondary | ICD-10-CM | POA: Insufficient documentation

## 2017-11-12 DIAGNOSIS — O479 False labor, unspecified: Secondary | ICD-10-CM | POA: Diagnosis not present

## 2017-11-12 DIAGNOSIS — O99333 Smoking (tobacco) complicating pregnancy, third trimester: Secondary | ICD-10-CM | POA: Insufficient documentation

## 2017-11-12 DIAGNOSIS — O0990 Supervision of high risk pregnancy, unspecified, unspecified trimester: Secondary | ICD-10-CM

## 2017-11-12 LAB — COMPREHENSIVE METABOLIC PANEL
ALT: 27 U/L (ref 14–54)
AST: 18 U/L (ref 15–41)
Albumin: 2.8 g/dL — ABNORMAL LOW (ref 3.5–5.0)
Alkaline Phosphatase: 125 U/L (ref 38–126)
Anion gap: 13 (ref 5–15)
BUN: 12 mg/dL (ref 6–20)
CHLORIDE: 104 mmol/L (ref 101–111)
CO2: 21 mmol/L — ABNORMAL LOW (ref 22–32)
Calcium: 9.6 mg/dL (ref 8.9–10.3)
Creatinine, Ser: 0.52 mg/dL (ref 0.44–1.00)
Glucose, Bld: 91 mg/dL (ref 65–99)
Potassium: 4.4 mmol/L (ref 3.5–5.1)
Sodium: 138 mmol/L (ref 135–145)
Total Bilirubin: 0.3 mg/dL (ref 0.3–1.2)
Total Protein: 6.3 g/dL — ABNORMAL LOW (ref 6.5–8.1)

## 2017-11-12 LAB — RAPID URINE DRUG SCREEN, HOSP PERFORMED
Amphetamines: NOT DETECTED
Benzodiazepines: POSITIVE — AB
Cocaine: NOT DETECTED
Opiates: NOT DETECTED
Tetrahydrocannabinol: NOT DETECTED

## 2017-11-12 LAB — PROTEIN / CREATININE RATIO, URINE
CREATININE, URINE: 195 mg/dL
PROTEIN CREATININE RATIO: 0.1 mg/mg{creat} (ref 0.00–0.15)
TOTAL PROTEIN, URINE: 19 mg/dL

## 2017-11-12 LAB — URINALYSIS, ROUTINE W REFLEX MICROSCOPIC
Bilirubin Urine: NEGATIVE
Glucose, UA: NEGATIVE mg/dL
Hgb urine dipstick: NEGATIVE
Ketones, ur: NEGATIVE mg/dL
LEUKOCYTES UA: NEGATIVE
NITRITE: NEGATIVE
Protein, ur: NEGATIVE mg/dL
SPECIFIC GRAVITY, URINE: 1.032 — AB (ref 1.005–1.030)
pH: 5 (ref 5.0–8.0)

## 2017-11-12 LAB — DIFFERENTIAL
BASOS ABS: 0 10*3/uL (ref 0.0–0.1)
Basophils Relative: 0 %
Eosinophils Absolute: 0.5 10*3/uL (ref 0.0–0.7)
Eosinophils Relative: 3 %
LYMPHS ABS: 2.8 10*3/uL (ref 0.7–4.0)
Lymphocytes Relative: 15 %
Monocytes Absolute: 1.1 10*3/uL — ABNORMAL HIGH (ref 0.1–1.0)
Monocytes Relative: 6 %
NEUTROS PCT: 76 %
Neutro Abs: 13.6 10*3/uL — ABNORMAL HIGH (ref 1.7–7.7)

## 2017-11-12 LAB — CBC
HCT: 33.1 % — ABNORMAL LOW (ref 36.0–46.0)
Hemoglobin: 11.1 g/dL — ABNORMAL LOW (ref 12.0–15.0)
MCH: 29 pg (ref 26.0–34.0)
MCHC: 33.5 g/dL (ref 30.0–36.0)
MCV: 86.4 fL (ref 78.0–100.0)
PLATELETS: 254 10*3/uL (ref 150–400)
RBC: 3.83 MIL/uL — ABNORMAL LOW (ref 3.87–5.11)
RDW: 13.2 % (ref 11.5–15.5)
WBC: 17.9 10*3/uL — AB (ref 4.0–10.5)

## 2017-11-12 MED ORDER — PROMETHAZINE HCL 25 MG PO TABS: 12.5000 mg | ORAL_TABLET | Freq: Four times a day (QID) | ORAL | 0 refills | Status: DC | PRN

## 2017-11-12 MED ORDER — BUTALBITAL-APAP-CAFFEINE 50-325-40 MG PO TABS: 1.0000 | ORAL_TABLET | Freq: Four times a day (QID) | ORAL | 0 refills | Status: DC | PRN

## 2017-11-12 MED ORDER — SODIUM CHLORIDE 0.9 % IV SOLN: Freq: Once | INTRAVENOUS | Status: DC

## 2017-11-12 MED ORDER — BUTALBITAL-APAP-CAFFEINE 50-325-40 MG PO TABS
2.0000 | ORAL_TABLET | Freq: Once | ORAL | Status: AC
  Administered 2017-11-12: 2 via ORAL
  Filled 2017-11-12: qty 2

## 2017-11-12 MED ORDER — METOCLOPRAMIDE HCL 5 MG/ML IJ SOLN
10.0000 mg | Freq: Once | INTRAMUSCULAR | Status: DC
  Filled 2017-11-12: qty 2

## 2017-11-12 MED ORDER — DIPHENHYDRAMINE HCL 50 MG/ML IJ SOLN
25.0000 mg | Freq: Once | INTRAMUSCULAR | Status: DC
  Filled 2017-11-12: qty 1

## 2017-11-12 MED ORDER — BETAMETHASONE SOD PHOS & ACET 6 (3-3) MG/ML IJ SUSP
12.0000 mg | Freq: Once | INTRAMUSCULAR | Status: AC
  Administered 2017-11-12: 12 mg via INTRAMUSCULAR
  Filled 2017-11-12: qty 2

## 2017-11-12 MED ORDER — PROMETHAZINE HCL 25 MG PO TABS
25.0000 mg | ORAL_TABLET | Freq: Once | ORAL | Status: AC
  Administered 2017-11-12: 25 mg via ORAL
  Filled 2017-11-12: qty 1

## 2017-11-12 MED ORDER — DEXAMETHASONE SODIUM PHOSPHATE 10 MG/ML IJ SOLN
10.0000 mg | Freq: Once | INTRAMUSCULAR | Status: DC
  Filled 2017-11-12: qty 1

## 2017-11-12 NOTE — Progress Notes (Addendum)
FHR verified via bedside U/S per Dr Nira Retortegele   0500- FHR verified via bedside U/S per CNM Leftwich-Kirby
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
## 2017-11-12 NOTE — Discharge Instructions (Signed)

## 2017-11-12 NOTE — MAU Note (Signed)
Has had cramping started around 2030., may be contractions but can't tell.  Lost mucous plug around 2300.  Migraines for several weeks; hasn't taken anything.  Thought BP was high; did not take at home.

## 2017-11-13 ENCOUNTER — Inpatient Hospital Stay (HOSPITAL_COMMUNITY)
Admission: AD | Admit: 2017-11-13 | Discharge: 2017-11-13 | Disposition: A | Discharge status: Home / Self Care | Payer: Medicaid Other | Source: Ambulatory Visit | Attending: Obstetrics & Gynecology | Admitting: Obstetrics & Gynecology

## 2017-11-13 ENCOUNTER — Encounter (HOSPITAL_COMMUNITY): Payer: Self-pay

## 2017-11-13 ENCOUNTER — Encounter: Payer: Medicaid Other | Admitting: Advanced Practice Midwife

## 2017-11-13 DIAGNOSIS — G8220 Paraplegia, unspecified: Secondary | ICD-10-CM | POA: Insufficient documentation

## 2017-11-13 DIAGNOSIS — F4310 Post-traumatic stress disorder, unspecified: Secondary | ICD-10-CM | POA: Insufficient documentation

## 2017-11-13 DIAGNOSIS — R109 Unspecified abdominal pain: Secondary | ICD-10-CM | POA: Diagnosis present

## 2017-11-13 DIAGNOSIS — O99353 Diseases of the nervous system complicating pregnancy, third trimester: Secondary | ICD-10-CM | POA: Insufficient documentation

## 2017-11-13 DIAGNOSIS — Z86718 Personal history of other venous thrombosis and embolism: Secondary | ICD-10-CM | POA: Diagnosis not present

## 2017-11-13 DIAGNOSIS — O99343 Other mental disorders complicating pregnancy, third trimester: Secondary | ICD-10-CM | POA: Diagnosis not present

## 2017-11-13 DIAGNOSIS — M419 Scoliosis, unspecified: Secondary | ICD-10-CM | POA: Diagnosis not present

## 2017-11-13 DIAGNOSIS — O99333 Smoking (tobacco) complicating pregnancy, third trimester: Secondary | ICD-10-CM | POA: Insufficient documentation

## 2017-11-13 DIAGNOSIS — Z8249 Family history of ischemic heart disease and other diseases of the circulatory system: Secondary | ICD-10-CM | POA: Diagnosis not present

## 2017-11-13 DIAGNOSIS — Z3A34 34 weeks gestation of pregnancy: Secondary | ICD-10-CM | POA: Insufficient documentation

## 2017-11-13 DIAGNOSIS — Z881 Allergy status to other antibiotic agents status: Secondary | ICD-10-CM | POA: Diagnosis not present

## 2017-11-13 DIAGNOSIS — O479 False labor, unspecified: Secondary | ICD-10-CM

## 2017-11-13 DIAGNOSIS — O4703 False labor before 37 completed weeks of gestation, third trimester: Secondary | ICD-10-CM | POA: Diagnosis not present

## 2017-11-13 DIAGNOSIS — O26893 Other specified pregnancy related conditions, third trimester: Secondary | ICD-10-CM | POA: Diagnosis present

## 2017-11-13 DIAGNOSIS — N898 Other specified noninflammatory disorders of vagina: Secondary | ICD-10-CM

## 2017-11-13 LAB — CULTURE, OB URINE: CULTURE: NO GROWTH

## 2017-11-13 LAB — AMNISURE RUPTURE OF MEMBRANE (ROM) NOT AT ARMC: AMNISURE: NEGATIVE

## 2017-11-13 MED ORDER — BETAMETHASONE SOD PHOS & ACET 6 (3-3) MG/ML IJ SUSP
12.0000 mg | Freq: Once | INTRAMUSCULAR | Status: AC
  Administered 2017-11-13: 12 mg via INTRAMUSCULAR
  Filled 2017-11-13: qty 2

## 2017-11-13 NOTE — Discharge Instructions (Signed)
Braxton Hicks Contractions °Contractions of the uterus can occur throughout pregnancy, but they are not always a sign that you are in labor. You may have practice contractions called Braxton Hicks contractions. These false labor contractions are sometimes confused with true labor. °What are Braxton Hicks contractions? °Braxton Hicks contractions are tightening movements that occur in the muscles of the uterus before labor. Unlike true labor contractions, these contractions do not result in opening (dilation) and thinning of the cervix. Toward the end of pregnancy (32-34 weeks), Braxton Hicks contractions can happen more often and may become stronger. These contractions are sometimes difficult to tell apart from true labor because they can be very uncomfortable. You should not feel embarrassed if you go to the hospital with false labor. °Sometimes, the only way to tell if you are in true labor is for your health care provider to look for changes in the cervix. The health care provider will do a physical exam and may monitor your contractions. If you are not in true labor, the exam should show that your cervix is not dilating and your water has not broken. °If there are other health problems associated with your pregnancy, it is completely safe for you to be sent home with false labor. You may continue to have Braxton Hicks contractions until you go into true labor. °How to tell the difference between true labor and false labor °True labor °· Contractions last 30-70 seconds. °· Contractions become very regular. °· Discomfort is usually felt in the top of the uterus, and it spreads to the lower abdomen and low back. °· Contractions do not go away with walking. °· Contractions usually become more intense and increase in frequency. °· The cervix dilates and gets thinner. °False labor °· Contractions are usually shorter and not as strong as true labor contractions. °· Contractions are usually irregular. °· Contractions  are often felt in the front of the lower abdomen and in the groin. °· Contractions may go away when you walk around or change positions while lying down. °· Contractions get weaker and are shorter-lasting as time goes on. °· The cervix usually does not dilate or become thin. °Follow these instructions at home: °· Take over-the-counter and prescription medicines only as told by your health care provider. °· Keep up with your usual exercises and follow other instructions from your health care provider. °· Eat and drink lightly if you think you are going into labor. °· If Braxton Hicks contractions are making you uncomfortable: °? Change your position from lying down or resting to walking, or change from walking to resting. °? Sit and rest in a tub of warm water. °? Drink enough fluid to keep your urine pale yellow. Dehydration may cause these contractions. °? Do slow and deep breathing several times an hour. °· Keep all follow-up prenatal visits as told by your health care provider. This is important. °Contact a health care provider if: °· You have a fever. °· You have continuous pain in your abdomen. °Get help right away if: °· Your contractions become stronger, more regular, and closer together. °· You have fluid leaking or gushing from your vagina. °· You pass blood-tinged mucus (bloody show). °· You have bleeding from your vagina. °· You have low back pain that you never had before. °· You feel your baby’s head pushing down and causing pelvic pressure. °· Your baby is not moving inside you as much as it used to. °Summary °· Contractions that occur before labor are called Braxton   Hicks contractions, false labor, or practice contractions. °· Braxton Hicks contractions are usually shorter, weaker, farther apart, and less regular than true labor contractions. True labor contractions usually become progressively stronger and regular and they become more frequent. °· Manage discomfort from Braxton Hicks contractions by  changing position, resting in a warm bath, drinking plenty of water, or practicing deep breathing. °This information is not intended to replace advice given to you by your health care provider. Make sure you discuss any questions you have with your health care provider. °Document Released: 09/28/2016 Document Revised: 09/28/2016 Document Reviewed: 09/28/2016 °[employer] Interactive Patient Education © 2018 [employer] ° °Fetal Movement Counts °Patient Name: ________________________________________________ Patient Due Date: ____________________ °What is a fetal movement count? °A fetal movement count is the number of times that you feel your baby move during a certain amount of time. This may also be called a fetal kick count. A fetal movement count is recommended for every pregnant woman. You may be asked to start counting fetal movements as early as week 28 of your pregnancy. °Pay attention to when your baby is most active. You may notice your baby's sleep and wake cycles. You may also notice things that make your baby move more. You should do a fetal movement count: °· When your baby is normally most active. °· At the same time each day. ° °A good time to count movements is while you are resting, after having something to eat and drink. °How do I count fetal movements? °1. Find a quiet, comfortable area. Sit, or lie down on your side. °2. Write down the date, the start time and stop time, and the number of movements that you felt between those two times. Take this information with you to your health care visits. °3. For 2 hours, count kicks, flutters, swishes, rolls, and jabs. You should feel at least 10 movements during 2 hours. °4. You may stop counting after you have felt 10 movements. °5. If you do not feel 10 movements in 2 hours, have something to eat and drink. Then, keep resting and counting for 1 hour. If you feel at least 4 movements during that hour, you may stop counting. °Contact a health care  provider if: °· You feel fewer than 4 movements in 2 hours. °· Your baby is not moving like he or she usually does. °Date: ____________ Start time: ____________ Stop time: ____________ Movements: ____________ °Date: ____________ Start time: ____________ Stop time: ____________ Movements: ____________ °Date: ____________ Start time: ____________ Stop time: ____________ Movements: ____________ °Date: ____________ Start time: ____________ Stop time: ____________ Movements: ____________ °Date: ____________ Start time: ____________ Stop time: ____________ Movements: ____________ °Date: ____________ Start time: ____________ Stop time: ____________ Movements: ____________ °Date: ____________ Start time: ____________ Stop time: ____________ Movements: ____________ °Date: ____________ Start time: ____________ Stop time: ____________ Movements: ____________ °Date: ____________ Start time: ____________ Stop time: ____________ Movements: ____________ °This information is not intended to replace advice given to you by your health care provider. Make sure you discuss any questions you have with your health care provider. °Document Released: 06/14/2006 Document Revised: 01/12/2016 Document Reviewed: 06/24/2015 °[employer] Interactive Patient Education © 2018 [employer] ° °
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
## 2017-11-13 NOTE — MAU Note (Signed)
Pt states she felt a gush of fluid at 0900 followed by pressure and tightening in abdomen.  Pt uses a wheelchair and states that she has diminished sensation in lower half but does believe she is in labor.  H/o PPROM at 34 weeks.

## 2017-11-13 NOTE — Progress Notes (Signed)
Pt HR elevated, tracing similar to FHR.  Pt states that has been normal for her the past several weeks, and that she is not symptomatic.  PA notified.

## 2017-11-15 ENCOUNTER — Other Ambulatory Visit: Payer: Self-pay

## 2017-11-15 ENCOUNTER — Inpatient Hospital Stay (HOSPITAL_COMMUNITY)
Admission: AD | Admit: 2017-11-15 | Discharge: 2017-11-15 | Disposition: A | Discharge status: Home / Self Care | Payer: Medicaid Other | Source: Ambulatory Visit | Attending: Obstetrics and Gynecology | Admitting: Obstetrics and Gynecology

## 2017-11-15 ENCOUNTER — Encounter (HOSPITAL_COMMUNITY): Payer: Self-pay | Admitting: Student

## 2017-11-15 DIAGNOSIS — D6862 Lupus anticoagulant syndrome: Secondary | ICD-10-CM | POA: Insufficient documentation

## 2017-11-15 DIAGNOSIS — F4310 Post-traumatic stress disorder, unspecified: Secondary | ICD-10-CM | POA: Insufficient documentation

## 2017-11-15 DIAGNOSIS — O4693 Antepartum hemorrhage, unspecified, third trimester: Secondary | ICD-10-CM

## 2017-11-15 DIAGNOSIS — O46093 Antepartum hemorrhage with other coagulation defect, third trimester: Secondary | ICD-10-CM | POA: Insufficient documentation

## 2017-11-15 DIAGNOSIS — Z3A34 34 weeks gestation of pregnancy: Secondary | ICD-10-CM | POA: Diagnosis not present

## 2017-11-15 DIAGNOSIS — Z7902 Long term (current) use of antithrombotics/antiplatelets: Secondary | ICD-10-CM | POA: Insufficient documentation

## 2017-11-15 DIAGNOSIS — F319 Bipolar disorder, unspecified: Secondary | ICD-10-CM | POA: Diagnosis not present

## 2017-11-15 DIAGNOSIS — Z79899 Other long term (current) drug therapy: Secondary | ICD-10-CM | POA: Insufficient documentation

## 2017-11-15 DIAGNOSIS — O99333 Smoking (tobacco) complicating pregnancy, third trimester: Secondary | ICD-10-CM | POA: Insufficient documentation

## 2017-11-15 DIAGNOSIS — F419 Anxiety disorder, unspecified: Secondary | ICD-10-CM | POA: Insufficient documentation

## 2017-11-15 DIAGNOSIS — N939 Abnormal uterine and vaginal bleeding, unspecified: Secondary | ICD-10-CM

## 2017-11-15 DIAGNOSIS — O99343 Other mental disorders complicating pregnancy, third trimester: Secondary | ICD-10-CM | POA: Diagnosis not present

## 2017-11-15 DIAGNOSIS — Z86718 Personal history of other venous thrombosis and embolism: Secondary | ICD-10-CM | POA: Diagnosis not present

## 2017-11-15 LAB — WET PREP, GENITAL
Clue Cells Wet Prep HPF POC: NONE SEEN
SPERM: NONE SEEN
TRICH WET PREP: NONE SEEN
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
## 2017-11-15 NOTE — Discharge Instructions (Signed)
Vaginal Bleeding During Pregnancy, Third Trimester °A small amount of bleeding (spotting) from the vagina is relatively common in pregnancy. Various things can cause bleeding or spotting in pregnancy. Sometimes the bleeding is normal and is not a problem. However, bleeding during the third trimester can also be a sign of something serious for the mother and the baby. Be sure to tell your health care provider about any vaginal bleeding right away. °Some possible causes of vaginal bleeding during the third trimester include: °· The placenta may be partially or completely covering the opening to the cervix (placenta previa). °· The placenta may have separated from the uterus (abruption of the placenta). °· There may be an infection or growth on the cervix. °· You may be starting labor, called discharging of the mucus plug. °· The placenta may grow into the muscle layer of the uterus (placenta accreta). ° °Follow these instructions at home: °Watch your condition for any changes. The following actions may help to lessen any discomfort you are feeling: °· Follow your health care provider's instructions for limiting your activity. If your health care provider orders bed rest, you may need to stay in bed and only get up to use the bathroom. However, your health care provider may allow you to continue light activity. °· If needed, make plans for someone to help with your regular activities and responsibilities while you are on bed rest. °· Keep track of the number of pads you use each day, how often you change pads, and how soaked (saturated) they are. Write this down. °· Do not use tampons. Do not douche. °· Do not have sexual intercourse or orgasms until approved by your health care provider. °· Follow your health care provider's advice about lifting, driving, and physical activities. °· If you pass any tissue from your vagina, save the tissue so you can show it to your health care provider. °· Only take over-the-counter  or prescription medicines as directed by your health care provider. °· Do not take aspirin because it can make you bleed. °· Keep all follow-up appointments as directed by your health care provider. ° °Contact a health care provider if: °· You have any vaginal bleeding during any part of your pregnancy. °· You have cramps or labor pains. °· You have a fever, not controlled by medicine. °Get help right away if: °· You have severe cramps or pain in your back or belly (abdomen). °· You have chills. °· You have a gush of fluid from the vagina. °· You pass large clots or tissue from your vagina. °· Your bleeding increases. °· You feel light-headed or weak. °· You pass out. °· You feel less movement or no movement of the baby. °This information is not intended to replace advice given to you by your health care provider. Make sure you discuss any questions you have with your health care provider. °Document Released: 08/05/2002 Document Revised: 10/21/2015 Document Reviewed: 01/20/2013 °Elsevier Interactive Patient Education © 2018 [employer] ° °

## 2017-11-15 NOTE — MAU Note (Signed)
Pt states that she was at home when she started having vaginal bleeding.

## 2017-11-16 ENCOUNTER — Encounter: Payer: Medicaid Other | Admitting: Obstetrics & Gynecology

## 2017-11-20 ENCOUNTER — Encounter (INDEPENDENT_AMBULATORY_CARE_PROVIDER_SITE_OTHER): Payer: Self-pay

## 2017-11-20 ENCOUNTER — Encounter: Payer: Self-pay | Admitting: Obstetrics & Gynecology

## 2017-11-20 ENCOUNTER — Ambulatory Visit (INDEPENDENT_AMBULATORY_CARE_PROVIDER_SITE_OTHER): Payer: Medicaid Other | Admitting: Obstetrics & Gynecology

## 2017-11-20 VITALS — BP 122/75 | HR 102

## 2017-11-20 DIAGNOSIS — Z1389 Encounter for screening for other disorder: Secondary | ICD-10-CM

## 2017-11-20 DIAGNOSIS — O99323 Drug use complicating pregnancy, third trimester: Secondary | ICD-10-CM

## 2017-11-20 DIAGNOSIS — O0993 Supervision of high risk pregnancy, unspecified, third trimester: Secondary | ICD-10-CM

## 2017-11-20 DIAGNOSIS — N319 Neuromuscular dysfunction of bladder, unspecified: Secondary | ICD-10-CM

## 2017-11-20 DIAGNOSIS — O99353 Diseases of the nervous system complicating pregnancy, third trimester: Secondary | ICD-10-CM

## 2017-11-20 DIAGNOSIS — O9989 Other specified diseases and conditions complicating pregnancy, childbirth and the puerperium: Secondary | ICD-10-CM

## 2017-11-20 DIAGNOSIS — Z79891 Long term (current) use of opiate analgesic: Secondary | ICD-10-CM

## 2017-11-20 DIAGNOSIS — Z3A35 35 weeks gestation of pregnancy: Secondary | ICD-10-CM

## 2017-11-20 DIAGNOSIS — O24410 Gestational diabetes mellitus in pregnancy, diet controlled: Secondary | ICD-10-CM

## 2017-11-20 DIAGNOSIS — Z331 Pregnant state, incidental: Secondary | ICD-10-CM

## 2017-11-20 LAB — POCT URINALYSIS DIPSTICK
Glucose, UA: NEGATIVE
KETONES UA: NEGATIVE
Leukocytes, UA: NEGATIVE
NITRITE UA: NEGATIVE
PROTEIN UA: NEGATIVE
RBC UA: NEGATIVE

## 2017-11-20 MED ORDER — BUTALBITAL-APAP-CAFFEINE 50-325-40 MG PO TABS: 1.0000 | ORAL_TABLET | Freq: Four times a day (QID) | ORAL | 0 refills | Status: DC | PRN

## 2017-11-20 NOTE — Progress Notes (Signed)
HIGH-RISK PREGNANCY VISIT Patient name: Theresa Shaw MRN [id number]  Date of birth: 10/12/1990 Chief Complaint:   high risk ob  History of Present Illness:   Theresa Shaw is a 27 y.o. [id number] female at 2872w3d with an Estimated Date of Delivery: 12/22/17 being seen today for ongoing management of a high-risk pregnancy complicated by Class A1 DM, paraplegia with neurogenic bladder recurrent uti on keflex hs, on subutuex for narcotic dependence management.  Today she reports hemorrhoid pain. Contractions: Irregular.  .  Movement: Present. denies leaking of fluid.  Review of Systems:   Pertinent items are noted in HPI Denies abnormal vaginal discharge w/ itching/odor/irritation, headaches, visual changes, shortness of breath, chest pain, abdominal pain, severe nausea/vomiting, or problems with urination or bowel movements unless otherwise stated above. Pertinent History Reviewed:  Reviewed past medical,surgical, social, obstetrical and family history.  Reviewed problem list, medications and allergies. Physical Assessment:   Vitals:   11/20/17 1522  BP: 122/75  Pulse: (!) 102  There is no height or weight on file to calculate BMI.           Physical Examination:   General appearance: alert, well appearing, and in no distress  Mental status: alert, oriented to person, place, and time  Skin: warm & dry   Extremities: Edema: Trace    Cardiovascular: normal heart rate noted  Respiratory: normal respiratory effort, no distress  Abdomen: gravid, soft, non-tender  Pelvic: Cervical exam deferred         Fetal Status:     Movement: Present    Fetal Surveillance Testing today: FHR 145   Results for orders placed or performed in visit on 11/20/17 (from the past 24 hour(s))  POCT urinalysis dipstick   Collection Time: 11/20/17  3:29 PM  Result Value Ref Range   Color, UA     Clarity, UA     Glucose, UA Negative Negative   Bilirubin, UA     Ketones, UA neg    Spec Grav, UA  1.010 -  1.025   Blood, UA neg    pH, UA  5.0 - 8.0   Protein, UA Negative Negative   Urobilinogen, UA  0.2 or 1.0 E.U./dL   Nitrite, UA neg    Leukocytes, UA Negative Negative   Appearance     Odor      Assessment & Plan:  1) High-risk pregnancy [id number] at 3272w3d with an Estimated Date of Delivery: 12/22/17   2) Class A1 DM, stable, pt reports CBG are good, EFW at next visit  3) Hx of DVT on lovenox, stable  4)  Paraplegia with neurogenic bladder on keflex qhs  Meds:  Meds ordered this encounter  Medications  . butalbital-acetaminophen-caffeine (FIORICET, ESGIC) 50-325-40 MG tablet    Sig: Take 1-2 tablets by mouth every 6 (six) hours as needed for headache.    Dispense:  20 tablet    Refill:  0    Labs/procedures today:   Treatment Plan:  Sonogram for EFW 2 weeks, continue keflex at hs, continue lovenox, on subutex  Reviewed: Preterm labor symptoms and general obstetric precautions including but not limited to vaginal bleeding, contractions, leaking of fluid and fetal movement were reviewed in detail with the patient.  All questions were answered.  Follow-up: Return in about 2 weeks (around 12/04/2017) for sonogram for EFW,, HROB.  Orders Placed This Encounter  Procedures  . US OB Follow Up  . POCT urinalysis dipstick   Lazaro ArmsLuther H Shanavia Makela MD 11/20/2017 4:10  PM

## 2017-11-23 ENCOUNTER — Other Ambulatory Visit: Payer: Self-pay | Admitting: Obstetrics and Gynecology

## 2017-11-23 MED ORDER — CLONAZEPAM 0.5 MG PO TABS: 0.5000 mg | ORAL_TABLET | Freq: Two times a day (BID) | ORAL | 0 refills | Status: DC | PRN

## 2017-11-23 NOTE — Telephone Encounter (Signed)
Patient called stating that she would like a refill of her medication clonazePAM sent to her pharmacy [facility]. Patient states that she needs the refill sent to day. Please contact pt when done

## 2017-11-23 NOTE — Progress Notes (Signed)
Klonipin 0.5 refil x 60 tabs refilled due to persistent anxiety

## 2017-11-26 ENCOUNTER — Telehealth: Payer: Self-pay | Admitting: *Deleted

## 2017-11-26 NOTE — Telephone Encounter (Signed)
Before we get all the way down that path, she needs to go to MAU and see if her pulse is sinus tach, a fib etc  So lets not put the cart before the horse, go to MAU and lets see what is going on first

## 2017-11-26 NOTE — Telephone Encounter (Signed)
Theresa PilgrimJacob called stating he spoke with after hours nurse on Friday in which she talked with Dr Despina HiddenEure regarding patient's elevated heart rate. Heart rate has been elevated in 100's for several weeks but is now up to 140-160's daily.  She is pushing fluids but it is not helping and is flushed in the face.   Was told it could be "autonomic response" and she could be induced as the elevated heart rate could cause a stroke and with her current situation, would like to request an induction. Patient is not scheduled to return for next visit until next week.  Can you advise?

## 2017-11-26 NOTE — Telephone Encounter (Signed)
Spoke to CarlinvilleJacob and informed him that Dr Despina HiddenEure [facility] with an E[facility] as we cannot perform those in our office.  He stated that [facility] since she has been there several times recently and her heartrate was elevated at those visits.  He stated " I have listened to her heart myself with a stethoscope, and it sounds like a regular rhythm. I'm not a doctor but I am very intelligent."  Informed patient I did not doubt he wasn't intelligent but some abnormal rhythms cannot be heard and are picked up by EKG.  Theresa Shaw stated [facility].  States she has already received the steroid shots for the baby's lungs.  Informed him that even though she has had those, she is still preterm and unless medically indicated, should carry the baby longer if possible. Stated he would discuss with her again and decide if they will go.

## 2017-11-30 ENCOUNTER — Encounter: Payer: Self-pay | Admitting: Obstetrics & Gynecology

## 2017-11-30 ENCOUNTER — Encounter (INDEPENDENT_AMBULATORY_CARE_PROVIDER_SITE_OTHER): Payer: Self-pay

## 2017-11-30 ENCOUNTER — Ambulatory Visit (INDEPENDENT_AMBULATORY_CARE_PROVIDER_SITE_OTHER): Payer: Medicaid Other | Admitting: Obstetrics & Gynecology

## 2017-11-30 ENCOUNTER — Telehealth: Payer: Self-pay | Admitting: *Deleted

## 2017-11-30 VITALS — BP 118/65 | HR 110

## 2017-11-30 DIAGNOSIS — Z3A36 36 weeks gestation of pregnancy: Secondary | ICD-10-CM

## 2017-11-30 DIAGNOSIS — O0993 Supervision of high risk pregnancy, unspecified, third trimester: Secondary | ICD-10-CM

## 2017-11-30 DIAGNOSIS — Z1389 Encounter for screening for other disorder: Secondary | ICD-10-CM

## 2017-11-30 DIAGNOSIS — O288 Other abnormal findings on antenatal screening of mother: Secondary | ICD-10-CM

## 2017-11-30 DIAGNOSIS — R109 Unspecified abdominal pain: Secondary | ICD-10-CM

## 2017-11-30 DIAGNOSIS — Z331 Pregnant state, incidental: Secondary | ICD-10-CM

## 2017-11-30 LAB — POCT URINALYSIS DIPSTICK
Glucose, UA: NEGATIVE
Ketones, UA: NEGATIVE
NITRITE UA: NEGATIVE
Protein, UA: POSITIVE — AB
RBC UA: NEGATIVE

## 2017-11-30 NOTE — Progress Notes (Signed)
Patient ID: Theresa Shaw, female   DOB: 02/05/1991, 27 y.o.   MRN: [id number]   [facility] Patient name: Theresa Shaw MRN [id number]  Date of birth: 07/02/1990 Chief Complaint:   work in ob (contractions every 2-5 minutes; GC/CHL)  History of Present Illness:   Theresa Shaw is a 27 y.o. [phone] female at 7696w6d with an Estimated Date of Delivery: 12/22/17 being seen today for ongoing management of a high-risk pregnancy complicated by Class A1 DM, paraplegia, methadone management.  Today she reports contractions. Contractions: Regular. Vag. Bleeding: None.  Movement: Present. denies leaking of fluid.  Review of Systems:   Pertinent items are noted in HPI Denies abnormal vaginal discharge w/ itching/odor/irritation, headaches, visual changes, shortness of breath, chest pain, abdominal pain, severe nausea/vomiting, or problems with urination or bowel movements unless otherwise stated above. Pertinent History Reviewed:  Reviewed past medical,surgical, social, obstetrical and family history.  Reviewed problem list, medications and allergies. Physical Assessment:   Vitals:   11/30/17 1219  BP: 118/65  Pulse: (!) 110  There is no height or weight on file to calculate BMI.           Physical Examination:   General appearance: alert, well appearing, and in no distress  Mental status: alert, oriented to person, place, and time  Skin: warm & dry   Extremities: Edema: None    Cardiovascular: normal heart rate noted  Respiratory: normal respiratory effort, no distress  Abdomen: gravid, soft, non-tender  Pelvic: Cervical exam performed  Dilation: 2.5 Effacement (%): 50 Station: -2  Fetal Status: Fetal Heart Rate (bpm): 144 Fundal Height: 34 cm Movement: Present Presentation: Vertex  Fetal Surveillance Testing today:    Results for orders placed or performed in visit on 11/30/17 (from the past 24 hour(s))  POCT urinalysis dipstick   Collection Time: 11/30/17 12:19 PM    Result Value Ref Range   Color, UA     Clarity, UA     Glucose, UA Negative Negative   Bilirubin, UA     Ketones, UA neg    Spec Grav, UA  1.010 - 1.025   Blood, UA neg    pH, UA  5.0 - 8.0   Protein, UA Positive (A) Negative   Urobilinogen, UA  0.2 or 1.0 E.U./dL   Nitrite, UA neg    Leukocytes, UA Moderate (2+) (A) Negative   Appearance     Odor      Assessment & Plan:  1) High-risk pregnancy [id number] at 7596w6d with an Estimated Date of Delivery: 12/22/17   2) Latent vs false labor, stable  3) Class A1 DM, stable  Meds: No orders of the defined types were placed in this encounter.   Labs/procedures today: cx 2-3/50/-2/soft/post/vertex  Treatment Plan:  PNV 1 week  Reviewed: Term labor symptoms and general obstetric precautions including but not limited to vaginal bleeding, contractions, leaking of fluid and fetal movement were reviewed in detail with the patient.  All questions were answered.  Follow-up: Return in about 1 week (around 12/07/2017) for HROB.  Orders Placed This Encounter  Procedures  . GC/Chlamydia Probe Amp  . POCT urinalysis dipstick   Lazaro ArmsLuther H Eure CNM, [facility]NP-BC 11/30/2017 1:02 PM

## 2017-11-30 NOTE — Telephone Encounter (Signed)
Patient states she is having abdominal tightness but is unsure if she is having contractions since she is unable to "feel them like normal people."  Says she has lost more of her mucous plug and is continuing to leak. She was unable to sleep last night and is just uncomfortable.  Spoke to Dr Despina HiddenEure and patient to come in for labor check.

## 2017-11-30 NOTE — Progress Notes (Signed)
Clear vaginal discharge with no odor.  

## 2017-12-02 LAB — GC/CHLAMYDIA PROBE AMP
Chlamydia trachomatis, NAA: NEGATIVE
Neisseria gonorrhoeae by PCR: NEGATIVE

## 2017-12-06 ENCOUNTER — Other Ambulatory Visit: Payer: Medicaid Other

## 2017-12-06 ENCOUNTER — Inpatient Hospital Stay (HOSPITAL_COMMUNITY)
Admission: AD | Admit: 2017-12-06 | Discharge: 2017-12-06 | Disposition: A | Discharge status: Home / Self Care | Payer: Medicaid Other | Source: Ambulatory Visit | Attending: Obstetrics and Gynecology | Admitting: Obstetrics and Gynecology

## 2017-12-06 ENCOUNTER — Encounter (HOSPITAL_COMMUNITY): Payer: Self-pay | Admitting: *Deleted

## 2017-12-06 DIAGNOSIS — Z3A00 Weeks of gestation of pregnancy not specified: Secondary | ICD-10-CM | POA: Diagnosis not present

## 2017-12-06 DIAGNOSIS — O479 False labor, unspecified: Secondary | ICD-10-CM | POA: Diagnosis present

## 2017-12-06 HISTORY — DX: Gestational diabetes mellitus in pregnancy, unspecified control: O24.419

## 2017-12-06 NOTE — Discharge Instructions (Signed)
Braxton Hicks Contractions Contractions of the uterus can occur throughout pregnancy, but they are not always a sign that you are in labor. You may have practice contractions called Braxton Hicks contractions. These false labor contractions are sometimes confused with true labor. What are Theresa PeltonBraxton Hicks contractions? Braxton Hicks contractions are tightening movements that occur in the muscles of the uterus before labor. Unlike true labor contractions, these contractions do not result in opening (dilation) and thinning of the cervix. Toward the end of pregnancy (32-34 weeks), Braxton Hicks contractions can happen more often and may become stronger. These contractions are sometimes difficult to tell apart from true labor because they can be very uncomfortable. You should not feel embarrassed if you go to the hospital with false labor. Sometimes, the only way to tell if you are in true labor is for your health care provider to look for changes in the cervix. The health care provider will do a physical exam and may monitor your contractions. If you are not in true labor, the exam should show that your cervix is not dilating and your water has not broken. If there are other health problems associated with your pregnancy, it is completely safe for you to be sent home with false labor. You may continue to have Braxton Hicks contractions until you go into true labor. How to tell the difference between true labor and false labor True labor  Contractions last 30-70 seconds.  Contractions become very regular.  Discomfort is usually felt in the top of the uterus, and it spreads to the lower abdomen and low back.  Contractions do not go away with walking.  Contractions usually become more intense and increase in frequency.  The cervix dilates and gets thinner. False labor  Contractions are usually shorter and not as strong as true labor contractions.  Contractions are usually irregular.  Contractions  are often felt in the front of the lower abdomen and in the groin.  Contractions may go away when you walk around or change positions while lying down.  Contractions get weaker and are shorter-lasting as time goes on.  The cervix usually does not dilate or become thin. Follow these instructions at home:  Take over-the-counter and prescription medicines only as told by your health care provider.  Keep up with your usual exercises and follow other instructions from your health care provider.  Eat and drink lightly if you think you are going into labor.  If Braxton Hicks contractions are making you uncomfortable: ? Change your position from lying down or resting to walking, or change from walking to resting. ? Sit and rest in a tub of warm water. ? Drink enough fluid to keep your urine pale yellow. Dehydration may cause these contractions. ? Do slow and deep breathing several times an hour.  Keep all follow-up prenatal visits as told by your health care provider. This is important. Contact a health care provider if:  You have a fever.  You have continuous pain in your abdomen. Get help right away if:  Your contractions become stronger, more regular, and closer together. You have fluid leaking or gushing from your vagina. Fetal Movement Counts Patient Name: ________________________________________________ Patient Due Date: ____________________ What is a fetal movement count? A fetal movement count is the number of times that you feel your baby move during a certain amount of time. This may also be called a fetal kick count. A fetal movement count is recommended for every pregnant woman. You may be asked to start  counting fetal movements as early as week 28 of your pregnancy. Pay attention to when your baby is most active. You may notice your baby's sleep and wake cycles. You may also notice things that make your baby move more. You should do a fetal movement count:  When your baby is  normally most active.  At the same time each day.  A good time to count movements is while you are resting, after having something to eat and drink. How do I count fetal movements? 1. Find a quiet, comfortable area. Sit, or lie down on your side. 2. Write down the date, the start time and stop time, and the number of movements that you felt between those two times. Take this information with you to your health care visits. 3. For 2 hours, count kicks, flutters, swishes, rolls, and jabs. You should feel at least 10 movements during 2 hours. 4. You may stop counting after you have felt 10 movements. 5. If you do not feel 10 movements in 2 hours, have something to eat and drink. Then, keep resting and counting for 1 hour. If you feel at least 4 movements during that hour, you may stop counting. Contact a health care provider if:  You feel fewer than 4 movements in 2 hours.  Your baby is not moving like he or she usually does. Date: ____________ Start time: ____________ Stop time: ____________ Movements: ____________ Date: ____________ Start time: ____________ Stop time: ____________ Movements: ____________ Date: ____________ Start time: ____________ Stop time: ____________ Movements: ____________ Date: ____________ Start time: ____________ Stop time: ____________ Movements: ____________ Date: ____________ Start time: ____________ Stop time: ____________ Movements: ____________ Date: ____________ Start time: ____________ Stop time: ____________ Movements: ____________ Date: ____________ Start time: ____________ Stop time: ____________ Movements: ____________ Date: ____________ Start time: ____________ Stop time: ____________ Movements: ____________ Date: ____________ Start time: ____________ Stop time: ____________ Movements: ____________ This information is not intended to replace advice given to you by your health care provider. Make sure you discuss any questions you have with your health  care provider. Document Released: 06/14/2006 Document Revised: 01/12/2016 Document Reviewed: 06/24/2015 Elsevier Interactive Patient Education  2018 [employer].    You have bleeding from your vagina.  You have low back pain that you never had before.  You feel your babys head pushing down and causing pelvic pressure.  Your baby is not moving inside you as much as it used to. Summary  Contractions that occur before labor are called Braxton Hicks contractions, false labor, or practice contractions.  Braxton Hicks contractions are usually shorter, weaker, farther apart, and less regular than true labor contractions. True labor contractions usually become progressively stronger and regular and they become more frequent.  Manage discomfort from [facility] contractions by changing position, resting in a warm bath, drinking plenty of water, or practicing deep breathing. This information is not intended to replace advice given to you by your health care provider. Make sure you discuss any questions you have with your health care provider. Document Released: 09/28/2016 Document Revised: 09/28/2016 Document Reviewed: 09/28/2016 Elsevier Interactive Patient Education  2018 [employer].

## 2017-12-06 NOTE — MAU Note (Signed)
Pt C/O uc's since 0530, very intense & frequent, feeling pelvic pressure.  Had scant bleeding last night with wiping, none this a.m., has passed a lot of mucus.  Denies LOF.

## 2017-12-07 ENCOUNTER — Encounter: Payer: Self-pay | Admitting: Women's Health

## 2017-12-07 ENCOUNTER — Ambulatory Visit (INDEPENDENT_AMBULATORY_CARE_PROVIDER_SITE_OTHER): Payer: Medicaid Other | Admitting: Women's Health

## 2017-12-07 ENCOUNTER — Ambulatory Visit (INDEPENDENT_AMBULATORY_CARE_PROVIDER_SITE_OTHER): Payer: Medicaid Other

## 2017-12-07 VITALS — BP 110/72 | HR 120

## 2017-12-07 DIAGNOSIS — Z3A37 37 weeks gestation of pregnancy: Secondary | ICD-10-CM

## 2017-12-07 DIAGNOSIS — N319 Neuromuscular dysfunction of bladder, unspecified: Secondary | ICD-10-CM

## 2017-12-07 DIAGNOSIS — Z6791 Unspecified blood type, Rh negative: Secondary | ICD-10-CM

## 2017-12-07 DIAGNOSIS — Z86718 Personal history of other venous thrombosis and embolism: Secondary | ICD-10-CM

## 2017-12-07 DIAGNOSIS — G8220 Paraplegia, unspecified: Secondary | ICD-10-CM

## 2017-12-07 DIAGNOSIS — O360130 Maternal care for anti-D [Rh] antibodies, third trimester, not applicable or unspecified: Secondary | ICD-10-CM | POA: Diagnosis not present

## 2017-12-07 DIAGNOSIS — O24410 Gestational diabetes mellitus in pregnancy, diet controlled: Secondary | ICD-10-CM

## 2017-12-07 DIAGNOSIS — O0990 Supervision of high risk pregnancy, unspecified, unspecified trimester: Secondary | ICD-10-CM

## 2017-12-07 DIAGNOSIS — O0993 Supervision of high risk pregnancy, unspecified, third trimester: Secondary | ICD-10-CM

## 2017-12-07 DIAGNOSIS — O26899 Other specified pregnancy related conditions, unspecified trimester: Secondary | ICD-10-CM

## 2017-12-07 DIAGNOSIS — R8271 Bacteriuria: Secondary | ICD-10-CM

## 2017-12-07 NOTE — Patient Instructions (Signed)
Allyne GeeHannah B Lopez, I greatly value your feedback.  If you receive a survey following your visit with us today, we appreciate you taking the time to fill it out.  Thanks, Joellyn HaffKim Elvert Cumpton, CNM, WHNP-BC   Call the office [phone]) or go to [facility] if:  You begin to have strong, frequent contractions  Your water breaks.  Sometimes it is a big gush of fluid, sometimes it is just a trickle that keeps getting your panties wet or running down your legs  You have vaginal bleeding.  It is normal to have a small amount of spotting if your cervix was checked.   You don't feel your baby moving like normal.  If you don't, get you something to eat and drink and lay down and focus on feeling your baby move.  You should feel at least 10 movements in 2 hours.  If you don't, you should call the office or go to [facility].     [facility] Contractions Contractions of the uterus can occur throughout pregnancy, but they are not always a sign that you are in labor. You may have practice contractions called Braxton Hicks contractions. These false labor contractions are sometimes confused with true labor. What are Deberah PeltonBraxton Hicks contractions? Braxton Hicks contractions are tightening movements that occur in the muscles of the uterus before labor. Unlike true labor contractions, these contractions do not result in opening (dilation) and thinning of the cervix. Toward the end of pregnancy (32-34 weeks), Braxton Hicks contractions can happen more often and may become stronger. These contractions are sometimes difficult to tell apart from true labor because they can be very uncomfortable. You should not feel embarrassed if you go to the hospital with false labor. Sometimes, the only way to tell if you are in true labor is for your health care provider to look for changes in the cervix. The health care provider will do a physical exam and may monitor your contractions. If you are not in true labor, the exam should  show that your cervix is not dilating and your water has not broken. If there are other health problems associated with your pregnancy, it is completely safe for you to be sent home with false labor. You may continue to have Braxton Hicks contractions until you go into true labor. How to tell the difference between true labor and false labor True labor  Contractions last 30-70 seconds.  Contractions become very regular.  Discomfort is usually felt in the top of the uterus, and it spreads to the lower abdomen and low back.  Contractions do not go away with walking.  Contractions usually become more intense and increase in frequency.  The cervix dilates and gets thinner. False labor  Contractions are usually shorter and not as strong as true labor contractions.  Contractions are usually irregular.  Contractions are often felt in the front of the lower abdomen and in the groin.  Contractions may go away when you walk around or change positions while lying down.  Contractions get weaker and are shorter-lasting as time goes on.  The cervix usually does not dilate or become thin. Follow these instructions at home:  Take over-the-counter and prescription medicines only as told by your health care provider.  Keep up with your usual exercises and follow other instructions from your health care provider.  Eat and drink lightly if you think you are going into labor.  If Braxton Hicks contractions are making you uncomfortable: ? Change your position from lying  down or resting to walking, or change from walking to resting. ? Sit and rest in a tub of warm water. ? Drink enough fluid to keep your urine pale yellow. Dehydration may cause these contractions. ? Do slow and deep breathing several times an hour.  Keep all follow-up prenatal visits as told by your health care provider. This is important. Contact a health care provider if:  You have a fever.  You have continuous pain in  your abdomen. Get help right away if:  Your contractions become stronger, more regular, and closer together.  You have fluid leaking or gushing from your vagina.  You pass blood-tinged mucus (bloody show).  You have bleeding from your vagina.  You have low back pain that you never had before.  You feel your baby's head pushing down and causing pelvic pressure.  Your baby is not moving inside you as much as it used to. Summary  Contractions that occur before labor are called Braxton Hicks contractions, false labor, or practice contractions.  Braxton Hicks contractions are usually shorter, weaker, farther apart, and less regular than true labor contractions. True labor contractions usually become progressively stronger and regular and they become more frequent.  Manage discomfort from [facility] contractions by changing position, resting in a warm bath, drinking plenty of water, or practicing deep breathing. This information is not intended to replace advice given to you by your health care provider. Make sure you discuss any questions you have with your health care provider. Document Released: 09/28/2016 Document Revised: 09/28/2016 Document Reviewed: 09/28/2016 Elsevier Interactive Patient Education  2018 [employer].
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
## 2017-12-07 NOTE — Progress Notes (Signed)
US 37+6 wks,cephalic,anterior pl gr 1,fhr 409132 bpm,afi 19 cm,bilat adnexa's wnl,efw 3108 g 41%

## 2017-12-10 ENCOUNTER — Telehealth: Payer: Self-pay | Admitting: Obstetrics & Gynecology

## 2017-12-10 NOTE — Telephone Encounter (Signed)
Needs someone to call in her to get her headache meds(  BAC tablets) has appt tomorrow but needs refill today if possible/ [facility]

## 2017-12-11 ENCOUNTER — Ambulatory Visit (INDEPENDENT_AMBULATORY_CARE_PROVIDER_SITE_OTHER): Payer: Medicaid Other | Admitting: Advanced Practice Midwife

## 2017-12-11 ENCOUNTER — Encounter: Payer: Self-pay | Admitting: Advanced Practice Midwife

## 2017-12-11 VITALS — BP 117/71 | HR 121

## 2017-12-11 DIAGNOSIS — Z331 Pregnant state, incidental: Secondary | ICD-10-CM

## 2017-12-11 DIAGNOSIS — Z1389 Encounter for screening for other disorder: Secondary | ICD-10-CM

## 2017-12-11 DIAGNOSIS — O24410 Gestational diabetes mellitus in pregnancy, diet controlled: Secondary | ICD-10-CM

## 2017-12-11 DIAGNOSIS — O99353 Diseases of the nervous system complicating pregnancy, third trimester: Secondary | ICD-10-CM

## 2017-12-11 DIAGNOSIS — Z3A38 38 weeks gestation of pregnancy: Secondary | ICD-10-CM

## 2017-12-11 DIAGNOSIS — O0993 Supervision of high risk pregnancy, unspecified, third trimester: Secondary | ICD-10-CM

## 2017-12-11 DIAGNOSIS — O09213 Supervision of pregnancy with history of pre-term labor, third trimester: Secondary | ICD-10-CM

## 2017-12-11 DIAGNOSIS — O99333 Smoking (tobacco) complicating pregnancy, third trimester: Secondary | ICD-10-CM

## 2017-12-11 DIAGNOSIS — F17210 Nicotine dependence, cigarettes, uncomplicated: Secondary | ICD-10-CM

## 2017-12-11 LAB — POCT URINALYSIS DIPSTICK
Blood, UA: NEGATIVE
GLUCOSE UA: NEGATIVE
Ketones, UA: NEGATIVE
NITRITE UA: NEGATIVE
Protein, UA: NEGATIVE

## 2017-12-11 NOTE — Progress Notes (Signed)
   Induction Assessment Scheduling Form: [facility]ax to [facility]:  [phone]  [name]                                                                                   DOB:  06/12/1990                                                            MRN:  [id number]                                                                     Phone #:   [phone]                         Provider:  [facility]  GP:  [id number]                                                            Estimated Date of Delivery: 12/22/17  Dating Criteria: early US    Medical Indications for induction:  Elective/paraplegic/A1DM Admission Date/Time:  7/21 0730 Gestational age on admission:  39.1   There were no vitals filed for this visit. HIV:  Non Reactive (05/06 1513) GBS:  + (urine 4/12)  3/50/-2   Method of induction(proposed):  pitocin   Scheduling Provider Signature:  Jacklyn ShellFrances Shaw, CNM                                            Today's Date:  12/11/2017
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
## 2017-12-11 NOTE — Patient Instructions (Signed)
If you are still pregnant on 7/21 at 7:30am, come to Maternity Admissions Unit ([address] [place], [place] to start your induction!  Eat a light meal before you come.  Daisey MustGood Luck!!

## 2017-12-12 ENCOUNTER — Telehealth (HOSPITAL_COMMUNITY): Payer: Self-pay | Admitting: *Deleted

## 2017-12-12 NOTE — Telephone Encounter (Signed)
Preadmission screen  

## 2017-12-14 ENCOUNTER — Telehealth: Payer: Self-pay | Admitting: Obstetrics and Gynecology

## 2017-12-15 NOTE — Telephone Encounter (Signed)
Patient called history reviewed.  Plans are for admission for induction at 39 weeks with cervical flexibility.  Patient reports that her previous pregnancy at 1134 weeks gestation with an 8-hour induction that was very easily accomplished with only 1 hour of pain perceived by the patient.  She denies any blood pressure problems.  She did not have an epidural. Case discussed with Dr. Malen GauzeFoster who will be anesthesiology on Sunday.  He will evaluate Theresa Shaw early on Sunday and give consideration to early epidural to better address potential for blood pressure instability secondary to spinal injury, Patient will take her last dose of Lovenox Saturday morning, and avoid Lovenox for 24 hours before arrival.  Lovenox to be restarted at 12 hours post presumed vaginal delivery

## 2017-12-16 ENCOUNTER — Encounter (HOSPITAL_COMMUNITY): Payer: Self-pay | Admitting: Anesthesiology

## 2017-12-16 ENCOUNTER — Inpatient Hospital Stay (HOSPITAL_COMMUNITY)
Admission: RE | Admit: 2017-12-16 | Discharge: 2017-12-18 | DRG: 806 | Disposition: A | Discharge status: Home / Self Care | Payer: Medicaid Other | Attending: Obstetrics and Gynecology | Admitting: Obstetrics and Gynecology

## 2017-12-16 ENCOUNTER — Encounter (HOSPITAL_COMMUNITY): Payer: Self-pay

## 2017-12-16 VITALS — BP 106/62 | HR 77 | Temp 97.7°F | Resp 16

## 2017-12-16 DIAGNOSIS — G8220 Paraplegia, unspecified: Secondary | ICD-10-CM | POA: Diagnosis present

## 2017-12-16 DIAGNOSIS — O99824 Streptococcus B carrier state complicating childbirth: Secondary | ICD-10-CM | POA: Diagnosis present

## 2017-12-16 DIAGNOSIS — Z3A39 39 weeks gestation of pregnancy: Secondary | ICD-10-CM

## 2017-12-16 DIAGNOSIS — F418 Other specified anxiety disorders: Secondary | ICD-10-CM | POA: Diagnosis present

## 2017-12-16 DIAGNOSIS — F1120 Opioid dependence, uncomplicated: Secondary | ICD-10-CM | POA: Diagnosis present

## 2017-12-16 DIAGNOSIS — O24429 Gestational diabetes mellitus in childbirth, unspecified control: Principal | ICD-10-CM | POA: Diagnosis present

## 2017-12-16 DIAGNOSIS — Z6791 Unspecified blood type, Rh negative: Secondary | ICD-10-CM

## 2017-12-16 DIAGNOSIS — O99324 Drug use complicating childbirth: Secondary | ICD-10-CM | POA: Diagnosis present

## 2017-12-16 DIAGNOSIS — Z86718 Personal history of other venous thrombosis and embolism: Secondary | ICD-10-CM | POA: Diagnosis not present

## 2017-12-16 DIAGNOSIS — O24420 Gestational diabetes mellitus in childbirth, diet controlled: Secondary | ICD-10-CM

## 2017-12-16 DIAGNOSIS — O26893 Other specified pregnancy related conditions, third trimester: Secondary | ICD-10-CM | POA: Diagnosis present

## 2017-12-16 DIAGNOSIS — M419 Scoliosis, unspecified: Secondary | ICD-10-CM | POA: Diagnosis present

## 2017-12-16 DIAGNOSIS — O99344 Other mental disorders complicating childbirth: Secondary | ICD-10-CM | POA: Diagnosis present

## 2017-12-16 DIAGNOSIS — O99354 Diseases of the nervous system complicating childbirth: Secondary | ICD-10-CM | POA: Diagnosis present

## 2017-12-16 DIAGNOSIS — O99334 Smoking (tobacco) complicating childbirth: Secondary | ICD-10-CM | POA: Diagnosis present

## 2017-12-16 DIAGNOSIS — F17210 Nicotine dependence, cigarettes, uncomplicated: Secondary | ICD-10-CM | POA: Diagnosis present

## 2017-12-16 LAB — RAPID URINE DRUG SCREEN, HOSP PERFORMED
Amphetamines: NOT DETECTED
BARBITURATES: NOT DETECTED
Benzodiazepines: POSITIVE — AB
Cocaine: NOT DETECTED
Opiates: NOT DETECTED
TETRAHYDROCANNABINOL: NOT DETECTED

## 2017-12-16 LAB — GLUCOSE, CAPILLARY
GLUCOSE-CAPILLARY: 141 mg/dL — AB (ref 70–99)
Glucose-Capillary: 101 mg/dL — ABNORMAL HIGH (ref 70–99)
Glucose-Capillary: 86 mg/dL (ref 70–99)

## 2017-12-16 LAB — RPR: RPR: NONREACTIVE

## 2017-12-16 LAB — CBC
HCT: 33.7 % — ABNORMAL LOW (ref 36.0–46.0)
HEMOGLOBIN: 11.6 g/dL — AB (ref 12.0–15.0)
MCH: 29.2 pg (ref 26.0–34.0)
MCHC: 34.4 g/dL (ref 30.0–36.0)
MCV: 84.9 fL (ref 78.0–100.0)
Platelets: 238 10*3/uL (ref 150–400)
RBC: 3.97 MIL/uL (ref 3.87–5.11)
RDW: 13.8 % (ref 11.5–15.5)
WBC: 13.6 10*3/uL — ABNORMAL HIGH (ref 4.0–10.5)

## 2017-12-16 MED ORDER — WITCH HAZEL-GLYCERIN EX PADS: 1.0000 "application " | MEDICATED_PAD | CUTANEOUS | Status: DC | PRN

## 2017-12-16 MED ORDER — FENTANYL 2.5 MCG/ML BUPIVACAINE 1/10 % EPIDURAL INFUSION (WH - ANES)
14.0000 mL/h | INTRAMUSCULAR | Status: DC | PRN
  Filled 2017-12-16: qty 100

## 2017-12-16 MED ORDER — ONDANSETRON HCL 4 MG/2ML IJ SOLN: 4.0000 mg | Freq: Four times a day (QID) | INTRAMUSCULAR | Status: DC | PRN

## 2017-12-16 MED ORDER — COCONUT OIL OIL: 1.0000 "application " | TOPICAL_OIL | Status: DC | PRN

## 2017-12-16 MED ORDER — SENNOSIDES-DOCUSATE SODIUM 8.6-50 MG PO TABS
2.0000 | ORAL_TABLET | ORAL | Status: DC
  Administered 2017-12-17: 1 via ORAL
  Filled 2017-12-16 (×2): qty 2

## 2017-12-16 MED ORDER — LACTATED RINGERS IV SOLN: 500.0000 mL | Freq: Once | INTRAVENOUS | Status: DC

## 2017-12-16 MED ORDER — LIDOCAINE HCL (PF) 1 % IJ SOLN
30.0000 mL | INTRAMUSCULAR | Status: DC | PRN
  Filled 2017-12-16: qty 30

## 2017-12-16 MED ORDER — IBUPROFEN 600 MG PO TABS
600.0000 mg | ORAL_TABLET | Freq: Four times a day (QID) | ORAL | Status: DC
  Administered 2017-12-16 – 2017-12-18 (×7): 600 mg via ORAL
  Filled 2017-12-16 (×8): qty 1

## 2017-12-16 MED ORDER — DIBUCAINE 1 % RE OINT: 1.0000 "application " | TOPICAL_OINTMENT | RECTAL | Status: DC | PRN

## 2017-12-16 MED ORDER — ENOXAPARIN SODIUM 60 MG/0.6ML ~~LOC~~ SOLN
60.0000 mg | SUBCUTANEOUS | Status: DC
  Administered 2017-12-17 (×2): 60 mg via SUBCUTANEOUS
  Filled 2017-12-16 (×3): qty 0.6

## 2017-12-16 MED ORDER — BUPRENORPHINE HCL 8 MG SL SUBL
8.0000 mg | SUBLINGUAL_TABLET | Freq: Two times a day (BID) | SUBLINGUAL | Status: DC
  Administered 2017-12-16 – 2017-12-18 (×4): 8 mg via SUBLINGUAL
  Filled 2017-12-16 (×4): qty 1

## 2017-12-16 MED ORDER — SODIUM CHLORIDE 0.9 % IV SOLN
5.0000 10*6.[IU] | Freq: Once | INTRAVENOUS | Status: AC
  Administered 2017-12-16: 5 10*6.[IU] via INTRAVENOUS
  Filled 2017-12-16: qty 5

## 2017-12-16 MED ORDER — FENTANYL CITRATE (PF) 100 MCG/2ML IJ SOLN
INTRAMUSCULAR | Status: AC
  Administered 2017-12-16: 100 ug via INTRAVENOUS
  Filled 2017-12-16: qty 2

## 2017-12-16 MED ORDER — SIMETHICONE 80 MG PO CHEW
80.0000 mg | CHEWABLE_TABLET | ORAL | Status: DC | PRN
Start: 2017-12-16 — End: 2017-12-18

## 2017-12-16 MED ORDER — ZOLPIDEM TARTRATE 5 MG PO TABS
5.0000 mg | ORAL_TABLET | Freq: Every evening | ORAL | Status: DC | PRN
Start: 2017-12-16 — End: 2017-12-18

## 2017-12-16 MED ORDER — ONDANSETRON HCL 4 MG/2ML IJ SOLN: 4.0000 mg | INTRAMUSCULAR | Status: DC | PRN

## 2017-12-16 MED ORDER — PRENATAL MULTIVITAMIN CH
1.0000 | ORAL_TABLET | Freq: Every day | ORAL | Status: DC
  Administered 2017-12-17 – 2017-12-18 (×2): 1 via ORAL
  Filled 2017-12-16 (×2): qty 1

## 2017-12-16 MED ORDER — DIPHENHYDRAMINE HCL 50 MG/ML IJ SOLN: 12.5000 mg | INTRAMUSCULAR | Status: DC | PRN

## 2017-12-16 MED ORDER — LACTATED RINGERS IV SOLN: 500.0000 mL | INTRAVENOUS | Status: DC | PRN

## 2017-12-16 MED ORDER — PHENYLEPHRINE 40 MCG/ML (10ML) SYRINGE FOR IV PUSH (FOR BLOOD PRESSURE SUPPORT)
80.0000 ug | PREFILLED_SYRINGE | INTRAVENOUS | Status: DC | PRN
  Filled 2017-12-16: qty 5
  Filled 2017-12-16: qty 10

## 2017-12-16 MED ORDER — OXYTOCIN 40 UNITS IN LACTATED RINGERS INFUSION - SIMPLE MED
1.0000 m[IU]/min | INTRAVENOUS | Status: DC
  Administered 2017-12-16: 2 m[IU]/min via INTRAVENOUS
  Filled 2017-12-16: qty 1000

## 2017-12-16 MED ORDER — TERBUTALINE SULFATE 1 MG/ML IJ SOLN
0.2500 mg | Freq: Once | INTRAMUSCULAR | Status: DC | PRN
  Filled 2017-12-16: qty 1

## 2017-12-16 MED ORDER — EPHEDRINE 5 MG/ML INJ
10.0000 mg | INTRAVENOUS | Status: DC | PRN
  Filled 2017-12-16: qty 2

## 2017-12-16 MED ORDER — SOD CITRATE-CITRIC ACID 500-334 MG/5ML PO SOLN: 30.0000 mL | ORAL | Status: DC | PRN

## 2017-12-16 MED ORDER — FLEET ENEMA 7-19 GM/118ML RE ENEM: 1.0000 | ENEMA | RECTAL | Status: DC | PRN

## 2017-12-16 MED ORDER — LACTATED RINGERS IV SOLN
INTRAVENOUS | Status: DC
  Administered 2017-12-16: 500 mL via INTRAVENOUS

## 2017-12-16 MED ORDER — OXYTOCIN 40 UNITS IN LACTATED RINGERS INFUSION - SIMPLE MED
2.5000 [IU]/h | INTRAVENOUS | Status: DC
  Administered 2017-12-16: 2.5 [IU]/h via INTRAVENOUS

## 2017-12-16 MED ORDER — FENTANYL CITRATE (PF) 100 MCG/2ML IJ SOLN
100.0000 ug | INTRAMUSCULAR | Status: DC | PRN
  Administered 2017-12-16 (×5): 100 ug via INTRAVENOUS
  Filled 2017-12-16 (×4): qty 2

## 2017-12-16 MED ORDER — ACETAMINOPHEN 325 MG PO TABS: 650.0000 mg | ORAL_TABLET | ORAL | Status: DC | PRN

## 2017-12-16 MED ORDER — DIPHENHYDRAMINE HCL 25 MG PO CAPS
25.0000 mg | ORAL_CAPSULE | Freq: Four times a day (QID) | ORAL | Status: DC | PRN
  Administered 2017-12-17: 25 mg via ORAL
  Filled 2017-12-16: qty 1

## 2017-12-16 MED ORDER — TETANUS-DIPHTH-ACELL PERTUSSIS 5-2.5-18.5 LF-MCG/0.5 IM SUSP: 0.5000 mL | Freq: Once | INTRAMUSCULAR | Status: DC

## 2017-12-16 MED ORDER — OXYTOCIN BOLUS FROM INFUSION
500.0000 mL | Freq: Once | INTRAVENOUS | Status: AC
  Administered 2017-12-16: 500 mL via INTRAVENOUS

## 2017-12-16 MED ORDER — PENICILLIN G POT IN DEXTROSE 60000 UNIT/ML IV SOLN
3.0000 10*6.[IU] | INTRAVENOUS | Status: DC
  Administered 2017-12-16: 3 10*6.[IU] via INTRAVENOUS
  Filled 2017-12-16 (×4): qty 50

## 2017-12-16 MED ORDER — ONDANSETRON HCL 4 MG PO TABS: 4.0000 mg | ORAL_TABLET | ORAL | Status: DC | PRN

## 2017-12-16 MED ORDER — CLONAZEPAM 0.5 MG PO TABS
0.5000 mg | ORAL_TABLET | Freq: Two times a day (BID) | ORAL | Status: DC | PRN
  Administered 2017-12-17 – 2017-12-18 (×3): 0.5 mg via ORAL
  Filled 2017-12-16 (×3): qty 1

## 2017-12-16 MED ORDER — HYDROXYZINE HCL 50 MG PO TABS
50.0000 mg | ORAL_TABLET | Freq: Four times a day (QID) | ORAL | Status: DC | PRN
  Filled 2017-12-16: qty 1

## 2017-12-16 MED ORDER — CEPHALEXIN 500 MG PO CAPS
500.0000 mg | ORAL_CAPSULE | Freq: Every day | ORAL | Status: DC
  Administered 2017-12-16 – 2017-12-17 (×2): 500 mg via ORAL
  Filled 2017-12-16 (×3): qty 1

## 2017-12-16 MED ORDER — BENZOCAINE-MENTHOL 20-0.5 % EX AERO: 1.0000 "application " | INHALATION_SPRAY | CUTANEOUS | Status: DC | PRN

## 2017-12-16 NOTE — Progress Notes (Signed)
Theresa Shaw is a 27 y.o. [phone] at 4530w1d by ultrasound admitted for induction of labor due to autonomic instibility.  Subjective:   Objective: BP 94/72   Pulse 95   Temp 98.4 F (36.9 C) (Oral)   LMP 03/07/2017   SpO2 98%  No intake/output data recorded. No intake/output data recorded.  FHT:  FHR: 130's bpm, variability: moderate,  accelerations:  Present,  decelerations:  Absent UC:   regular, every 3 minutes SVE:   Dilation: 3 Effacement (%): 60 Station: -3 Exam by:: Theresa GlazierJennifer Hamilton RN  Labs: Lab Results  Component Value Date   WBC 13.6 (H) 12/16/2017   HGB 11.6 (L) 12/16/2017   HCT 33.7 (L) 12/16/2017   MCV 84.9 12/16/2017   PLT 238 12/16/2017    Assessment / Plan: Induction of labor due to autonomic instibility,  progressing well on pitocin  Labor: Progressing normally Preeclampsia:  no signs or symptoms of toxicity Fetal Wellbeing:  Category I Pain Control:  Epidural I/D:  n/a Anticipated MOD:  NSVD  Theresa Shaw 12/16/2017, 3:08 PM
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
## 2017-12-16 NOTE — Anesthesia Pain Management Evaluation Note (Signed)
  [facility] Pain Management Visit Note  Patient: Theresa GeeHannah B Wimmer, 27 y.o., female  "Hello I am a [facility] of the anesthesia [facility]. We have an anesthesia team available at all times to provide care throughout the hospital, including epidural management and anesthesia for C-section. I don't know your plan for the delivery whether it a natural birth, water birth, IV sedation, nitrous supplementation, doula or epidural, but we want to meet your pain goals."   1.Was your pain managed to your expectations on prior hospitalizations?   Yes   2.What is your expectation for pain management during this hospitalization?     IV pain meds  3.How can we help you reach that goal?   Record the patient's initial score and the patient's pain goal.   Pain: 6  Pain Goal: 7 [facility] wants you to be able to say your pain was always managed very well.  Laban EmperorMalinova,Benard Minturn Hristova 12/16/2017
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
## 2017-12-17 ENCOUNTER — Telehealth: Payer: Self-pay | Admitting: *Deleted

## 2017-12-17 ENCOUNTER — Other Ambulatory Visit: Payer: Self-pay

## 2017-12-17 MED ORDER — GLYCERIN (LAXATIVE) 2.1 G RE SUPP
1.0000 | Freq: Every day | RECTAL | Status: DC | PRN
Start: 2017-12-18 — End: 2017-12-18
  Filled 2017-12-17: qty 1

## 2017-12-17 NOTE — Telephone Encounter (Signed)
Left message with significant other for pt to call us back to set up pp appointment.  12-17-17  AS

## 2017-12-17 NOTE — Lactation Note (Signed)
This note was copied from a baby's chart. Lactation Consultation Note  Patient Name: Theresa Shaw Today's Date: 12/17/2017  P1, 7 hour infant in NICU, mom is paraplegic waste down. Mom reported breast changes in pregnancy. Mom desires to breastfeed, started using DEBP and expressed colostrum which was taken w/ labels to NICU.  Mom shown how to use DEBP & how to disassemble, clean, & reassemble parts by RN. LC gave booklet "Providing Breast milk for your Baby in NICU.  Mom encouraged to feed baby 8-12 times/24 hours and with feeding cues.  Mom made aware of O/P services, breastfeeding support groups, community resources, and our phone # for post-discharge questions.  Will call LC if have any further questions or concerns.   Maternal Data    Feeding    LATCH Score                   Interventions    Lactation Tools Discussed/Used     Consult Status      Theresa Shaw 12/17/2017, 12:46 AM

## 2017-12-17 NOTE — Progress Notes (Signed)
Rounding attempted, patient not in room Clayton BiblesSamantha Weinhold, [place]  12/17/17  9:00am

## 2017-12-17 NOTE — Progress Notes (Signed)
Post Partum Day 1, SVD 12/16/2017 @ 1725 Subjective: voiding, tolerating PO and + flatus  Patient visibly upset and voicing concern that her daughter has broken clavicle and is receiving oxygen via [place] in NICU.  Reports pain is well-managed with prescribed medications States her visual disturbances, which she describes as "camera flashes" have "mostly gone away" and "seem to be worse with stress". Reports history of migraines without aura.  Objective: Blood pressure 111/72, pulse 92, temperature 97.8 F (36.6 C), temperature source Oral, resp. rate 16, last menstrual period 03/07/2017, SpO2 100 %, unknown if currently breastfeeding.  Physical Exam:  General: alert, cooperative, appears stated age and mild distress Lochia: appropriate Uterine Fundus: firm Incision: N/A DVT Evaluation: No evidence of DVT seen on physical exam.  Recent Labs    12/16/17 0845  HGB 11.6*  HCT 33.7*    Assessment/Plan: Plan for discharge tomorrow, currently pumping and providing expressed milk to NICU   LOS: 1 day   Theresa CantorSamantha C Starasia Shaw, CNM 12/17/2017, 12:25 PM

## 2017-12-17 NOTE — Lactation Note (Signed)
This note was copied from a baby's chart. Lactation Consultation Note  Patient Name: Theresa Shaw ZOXWR'UToday's Date: 12/17/2017   Lactation visit attempted in Room 117, but Mother asleep.   [name], [name] [facility] 12/17/2017, 5:23 PM

## 2017-12-17 NOTE — Progress Notes (Signed)
Rounding re-attempted, patient not in room  Clayton BiblesSamantha Weinhold, [place] 12/17/17  10:15 AM

## 2017-12-17 NOTE — Progress Notes (Addendum)
Patient ID: Theresa Shaw, female   DOB: 06/15/1990, 27 y.o.   MRN: [id number]  POSTPARTUM PROGRESS NOTE  Post partum Day 1 Subjective:  Theresa Shaw is a 27 y.o. [id number] 8220w1d s/p SVD. She is paraplegic w/ hx of L dvt.   Patient complains of painful headache lasting 4 hours and seeing 'camera flashes' in her vision last night before sleeping.  Did not have those complaints this morning. No acute events overnight.  Pt denies problems with voiding or po intake.  She denies nausea or vomiting.  Pain is moderately controlled.  She has not had flatus. She has not had bowel movement.  Lochia Small.   Objective: Blood pressure 111/72, pulse 92, temperature 97.8 F (36.6 C), temperature source Oral, resp. rate 16, last menstrual period 03/07/2017, SpO2 100 %, unknown if currently breastfeeding.  Physical Exam:  General: alert, cooperative and no distress Chest: no respiratory distress Heart:regular rate, distal pulses intact Abdomen: soft,   Uterine Fundus: firm, appropriately tender DVT Evaluation: No calf swelling or tenderness Extremities: minimal edema  Recent Labs    12/16/17 0845  HGB 11.6*  HCT 33.7*    Assessment/Plan:  ASSESSMENT: Theresa Shaw is a 27 y.o. [id number] 3220w1d s/p SVD after IOL for autonomic instability.  w/ paraplegia and hx of DVT. Had HA and blurry vision last night but none this AM and BP have been < 120  Currently on subutex, klonipin, keflex, and lovenox.    Plan for discharge tomorrow   LOS: 1 day   Jackelyn KnifeDaniel K OlsonMD 12/17/2017, 11:17 AM   RESIDENT ADDENDUM I have separately seen and examined the patient. I have discussed the findings and exam with the resident and agree with the above note. I helped develop the management plan that is described in the student's note, and I agree with the content.     Clayton BiblesSamantha Halah Whiteside, [place] 12/17/17  2:43 PM

## 2017-12-18 MED ORDER — IBUPROFEN 600 MG PO TABS: 600.0000 mg | ORAL_TABLET | Freq: Four times a day (QID) | ORAL | 0 refills | Status: DC

## 2017-12-18 MED ORDER — ACETAMINOPHEN 325 MG PO TABS: 650.0000 mg | ORAL_TABLET | ORAL | 0 refills | Status: DC | PRN

## 2017-12-18 NOTE — Discharge Summary (Signed)
OB Discharge Summary     Patient Name: Theresa Shaw DOB: December 03, 1990 MRN: [id number]  Date of admission: 12/16/2017 Delivering MD: Tilda Burrow   Date of discharge: 12/18/2017  Admitting diagnosis: INDUCTION Intrauterine pregnancy: [redacted]w[redacted]d     Secondary diagnosis:  Principal Problem:   Normal labor Active Problems:   History of DVT of lower extremity   Depression with anxiety  Additional problems: Paraplegia       Discharge diagnosis: Term Pregnancy Delivered                                                                                                Post partum procedures:none  Augmentation: None   Complications: Neonate in NICU  Hospital course:  Induction of Labor With Vaginal Delivery   27 y.o. yo [id number] at [redacted]w[redacted]d was admitted to the hospital 12/16/2017 for induction of labor.  Indication for induction: Autonomic Instability, GDM.  Patient had an uncomplicated labor course as follows: Membrane Rupture Time/Date: 2:35 PM ,12/16/2017   Intrapartum Procedures: Episiotomy: None [1]                                         Lacerations:  None [1]  Patient had delivery of a Viable infant.  Information for the patient's newborn:  Kerrigan, Gombos [[id number]]  Delivery Method: Vaginal, Spontaneous(Filed from Delivery Summary)   12/16/2017  Details of delivery can be found in separate delivery note.  Patient had a routine postpartum course. Patient is discharged home 12/18/17.  Physical exam  Vitals:   12/17/17 1255 12/17/17 1415 12/17/17 2214 12/18/17 0700  BP: 107/67 (!) 99/59 116/85 106/62  Pulse: 78 (!) 115 (!) 106 77  Resp: 16 18 16 16   Temp: 98 F (36.7 C) 97.6 F (36.4 C) 97.7 F (36.5 C) 97.7 F (36.5 C)  TempSrc: Oral Oral Oral Oral  SpO2:  97%     General: alert and cooperative Lochia: appropriate Uterine Fundus: firm Incision: Healing well with no significant drainage, N/A DVT Evaluation: No evidence of DVT seen on physical exam. Calf/Ankle  edema is present Labs: Lab Results  Component Value Date   WBC 13.6 (H) 12/16/2017   HGB 11.6 (L) 12/16/2017   HCT 33.7 (L) 12/16/2017   MCV 84.9 12/16/2017   PLT 238 12/16/2017   CMP Latest Ref Rng & Units 11/12/2017  Glucose 65 - 99 mg/dL 91  BUN 6 - 20 mg/dL 12  Creatinine 1.30 - 8.65 mg/dL 7.84  Sodium 696 - 295 mmol/L 138  Potassium 3.5 - 5.1 mmol/L 4.4  Chloride 101 - 111 mmol/L 104  CO2 22 - 32 mmol/L 21(L)  Calcium 8.9 - 10.3 mg/dL 9.6  Total Protein 6.5 - 8.1 g/dL 6.3(L)  Total Bilirubin 0.3 - 1.2 mg/dL 0.3  Alkaline Phos 38 - 126 U/L 125  AST 15 - 41 U/L 18  ALT 14 - 54 U/L 27    Discharge instruction: per After Visit Summary and "Baby and Me Booklet".  After visit  meds:  Allergies as of 12/18/2017      Reactions   Ciprofloxacin Nausea Only      Medication List    TAKE these medications   acetaminophen 325 MG tablet Commonly known as:  TYLENOL Take 2 tablets (650 mg total) by mouth every 4 (four) hours as needed (for pain scale < 4).   buprenorphine 8 MG Subl SL tablet Commonly known as:  SUBUTEX Place 8 mg under the tongue 2 (two) times daily.   cephALEXin 500 MG capsule Commonly known as:  KEFLEX Take 1 capsule (500 mg total) by mouth at bedtime. After you finish the septra   clonazePAM 0.5 MG tablet Commonly known as:  KLONOPIN Take 1 tablet (0.5 mg total) by mouth 2 (two) times daily as needed for anxiety.   DICLEGIS 10-10 MG Tbec Generic drug:  Doxylamine-Pyridoxine TAKE 1 TABLET EACH MORNING, 1 TABLET EACH AFTERNOON, AND 2 TABLETS ATBEDTIME.   enoxaparin 60 MG/0.6ML injection Commonly known as:  LOVENOX Inject 60 mg into the skin daily.   ferrous sulfate 325 (65 FE) MG tablet Take 1 tablet (325 mg total) by mouth 2 (two) times daily with a meal.   ibuprofen 600 MG tablet Commonly known as:  ADVIL,MOTRIN Take 1 tablet (600 mg total) by mouth every 6 (six) hours.   omeprazole 20 MG capsule Commonly known as:  PRILOSEC Take 1 capsule  (20 mg total) by mouth daily. 1 tablet a day   prenatal vitamin w/FE, FA 27-1 MG Tabs tablet Take 1 tablet by mouth daily at 12 noon.   promethazine 25 MG tablet Commonly known as:  PHENERGAN Take 0.5-1 tablets (12.5-25 mg total) by mouth every 6 (six) hours as needed for nausea. What changed:  reasons to take this       Diet: routine diet  Activity: Advance as tolerated. Pelvic rest for 6 weeks.   Outpatient follow up:2 weeks Follow up Appt: Future Appointments  Date Time Provider Department Center  01/22/2018  4:00 PM Cresenzo-Dishmon, Scarlette CalicoFrances, CNM [facility]   Follow up Visit:No follow-ups on file.  Postpartum contraception: IUD  Newborn Data: Live born female  Birth Weight: 6 lb 9.5 oz (2990 g) APGAR: 8, 9  Newborn Delivery   Birth date/time:  12/16/2017 17:25:00 Delivery type:  Vaginal, Spontaneous     Baby Feeding: Bottle Disposition:home with mother   12/18/2017 Sandi RavelingAnson B Benedetto Ryder, MD

## 2017-12-18 NOTE — Lactation Note (Signed)
This note was copied from a baby's chart. Lactation Consultation Note  Patient Name: Theresa Adele DanHannah Collums WUJWJ'XToday's Date: 12/18/2017 Reason for consult: Follow-up assessment;NICU baby Mom upset today due to baby's condition.  She has not pumped today but plans on pumping soon.  Instructed to pump every 3 hours.  Discussed milk coming to volume.  Mom will try to relax while pumping.  Encouraged to call for assist/concerns.  She has a Medela pump in style for home use.  Maternal Data    Feeding    LATCH Score                   Interventions    Lactation Tools Discussed/Used     Consult Status Consult Status: PRN    Huston FoleyMOULDEN, Janae Bonser S 12/18/2017, 1:49 PM

## 2017-12-18 NOTE — Discharge Instructions (Signed)
Vaginal Delivery, Care After °Refer to this sheet in the next few weeks. These instructions provide you with information about caring for yourself after vaginal delivery. Your health care provider may also give you more specific instructions. Your treatment has been planned according to current medical practices, but problems sometimes occur. Call your health care provider if you have any problems or questions. °What can I expect after the procedure? °After vaginal delivery, it is common to have: °· Some bleeding from your vagina. °· Soreness in your abdomen, your vagina, and the area of skin between your vaginal opening and your anus (perineum). °· Pelvic cramps. °· Fatigue. ° °Follow these instructions at home: °Medicines °· Take over-the-counter and prescription medicines only as told by your health care provider. °· If you were prescribed an antibiotic medicine, take it as told by your health care provider. Do not stop taking the antibiotic until it is finished. °Driving ° °· Do not drive or operate heavy machinery while taking prescription pain medicine. °· Do not drive for 24 hours if you received a sedative. °Lifestyle °· Do not drink alcohol. This is especially important if you are breastfeeding or taking medicine to relieve pain. °· Do not use tobacco products, including cigarettes, chewing tobacco, or e-cigarettes. If you need help quitting, ask your health care provider. °Eating and drinking °· Drink at least 8 eight-ounce glasses of water every day unless you are told not to by your health care provider. If you choose to breastfeed your baby, you may need to drink more water than this. °· Eat high-fiber foods every day. These foods may help prevent or relieve constipation. High-fiber foods include: °? Whole grain cereals and breads. °? Brown rice. °? Beans. °? Fresh fruits and vegetables. °Activity °· Return to your normal activities as told by your health care provider. Ask your health care provider  what activities are safe for you. °· Rest as much as possible. Try to rest or take a nap when your baby is sleeping. °· Do not lift anything that is heavier than your baby or 10 lb (4.5 kg) until your health care provider says that it is safe. °· Talk with your health care provider about when you can engage in sexual activity. This may depend on your: °? Risk of infection. °? Rate of healing. °? Comfort and desire to engage in sexual activity. °Vaginal Care °· If you have an episiotomy or a vaginal tear, check the area every day for signs of infection. Check for: °? More redness, swelling, or pain. °? More fluid or blood. °? Warmth. °? Pus or a bad smell. °· Do not use tampons or douches until your health care provider says this is safe. °· Watch for any blood clots that may pass from your vagina. These may look like clumps of dark red, brown, or black discharge. °General instructions °· Keep your perineum clean and dry as told by your health care provider. °· Wear loose, comfortable clothing. °· Wipe from front to back when you use the toilet. °· Ask your health care provider if you can shower or take a bath. If you had an episiotomy or a perineal tear during labor and delivery, your health care provider may tell you not to take baths for a certain length of time. °· Wear a bra that supports your breasts and fits you well. °· If possible, have someone help you with household activities and help care for your baby for at least a few days after   you leave the hospital. °· Keep all follow-up visits for you and your baby as told by your health care provider. This is important. °Contact a health care provider if: °· You have: °? Vaginal discharge that has a bad smell. °? Difficulty urinating. °? Pain when urinating. °? A sudden increase or decrease in the frequency of your bowel movements. °? More redness, swelling, or pain around your episiotomy or vaginal tear. °? More fluid or blood coming from your episiotomy or  vaginal tear. °? Pus or a bad smell coming from your episiotomy or vaginal tear. °? A fever. °? A rash. °? Little or no interest in activities you used to enjoy. °? Questions about caring for yourself or your baby. °· Your episiotomy or vaginal tear feels warm to the touch. °· Your episiotomy or vaginal tear is separating or does not appear to be healing. °· Your breasts are painful, hard, or turn red. °· You feel unusually sad or worried. °· You feel nauseous or you vomit. °· You pass large blood clots from your vagina. If you pass a blood clot from your vagina, save it to show to your health care provider. Do not flush blood clots down the toilet without having your health care provider look at them. °· You urinate more than usual. °· You are dizzy or light-headed. °· You have not breastfed at all and you have not had a menstrual period for 12 weeks after delivery. °· You have stopped breastfeeding and you have not had a menstrual period for 12 weeks after you stopped breastfeeding. °Get help right away if: °· You have: °? Pain that does not go away or does not get better with medicine. °? Chest pain. °? Difficulty breathing. °? Blurred vision or spots in your vision. °? Thoughts about hurting yourself or your baby. °· You develop pain in your abdomen or in one of your legs. °· You develop a severe headache. °· You faint. °· You bleed from your vagina so much that you fill two sanitary pads in one hour. °This information is not intended to replace advice given to you by your health care provider. Make sure you discuss any questions you have with your health care provider. °Document Released: 05/12/2000 Document Revised: 10/27/2015 Document Reviewed: 05/30/2015 °Elsevier Interactive Patient Education © 2018 Elsevier Inc. ° °
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
## 2017-12-20 LAB — TYPE AND SCREEN
ABO/RH(D): A NEG
Antibody Screen: POSITIVE
UNIT DIVISION: 0
UNIT DIVISION: 0

## 2017-12-20 LAB — BPAM RBC
BLOOD PRODUCT EXPIRATION DATE: 201908172359
Blood Product Expiration Date: 201908182359
Unit Type and Rh: 600
Unit Type and Rh: 600

## 2017-12-31 ENCOUNTER — Telehealth: Payer: Self-pay | Admitting: *Deleted

## 2018-01-01 NOTE — Telephone Encounter (Signed)
Attempted to call and leave message that prescription for wheelchair was being faxed today. Unable to leave message, mailbox full.

## 2018-01-22 ENCOUNTER — Ambulatory Visit: Payer: Self-pay | Admitting: Advanced Practice Midwife

## 2018-01-31 ENCOUNTER — Encounter: Payer: Self-pay | Admitting: Advanced Practice Midwife

## 2018-01-31 ENCOUNTER — Other Ambulatory Visit: Payer: Self-pay

## 2018-01-31 ENCOUNTER — Ambulatory Visit (INDEPENDENT_AMBULATORY_CARE_PROVIDER_SITE_OTHER): Payer: Medicaid Other | Admitting: Advanced Practice Midwife

## 2018-01-31 VITALS — BP 110/72 | HR 87

## 2018-01-31 DIAGNOSIS — R8290 Unspecified abnormal findings in urine: Secondary | ICD-10-CM | POA: Diagnosis not present

## 2018-01-31 DIAGNOSIS — Z87440 Personal history of urinary (tract) infections: Secondary | ICD-10-CM | POA: Diagnosis not present

## 2018-01-31 LAB — POCT URINALYSIS DIPSTICK OB
Blood, UA: NEGATIVE
GLUCOSE, UA: NEGATIVE
KETONES UA: NEGATIVE
Leukocytes, UA: NEGATIVE
Nitrite, UA: POSITIVE
POC,PROTEIN,UA: NEGATIVE

## 2018-01-31 MED ORDER — ESCITALOPRAM OXALATE 5 MG PO TABS: 5.0000 mg | ORAL_TABLET | Freq: Every day | ORAL | 2 refills | Status: DC

## 2018-01-31 NOTE — Progress Notes (Signed)
Theresa Shaw is a 27 y.o. who presents for a postpartum visit. She is 6 weeks postpartum following a spontaneous vaginal delivery. I have fully reviewed the prenatal and intrapartum course. The delivery was at 39.1 gestational weeks.  Anesthesia: none. Postpartum course has been uneventful. Baby's course has been complicated by NICU stay for 2 weeks; had a seizure last week.   Born w/o a thyroid. Baby is feeding by bottle. Bleeding: no bleeding. Bowel function is normal. Bladder function is normal. Patient is not sexually active. Contraception method is abstinence. Postpartum depression screening: negative.But very upset because FOB micromanages baby care"driving her crazy".  Already sees therapist twice a week.  Encouraged to contiue to work in therapy regarding relationship. Wants a SSRI.    Current Outpatient Medications:  .  buprenorphine (SUBUTEX) 8 MG SUBL SL tablet, Place 8 mg under the tongue 2 (two) times daily. , Disp: , Rfl:  .  enoxaparin (LOVENOX) 60 MG/0.6ML injection, Inject 60 mg into the skin daily., Disp: , Rfl:  .  escitalopram (LEXAPRO) 5 MG tablet, Take 1 tablet (5 mg total) by mouth daily., Disp: 30 tablet, Rfl: 2  Review of Systems   Constitutional: Negative for fever and chills Eyes: Negative for visual disturbances Respiratory: Negative for shortness of breath, dyspnea Cardiovascular: Negative for chest pain or palpitations  Gastrointestinal: Negative for vomiting, diarrhea and constipation Genitourinary: Negative for dysuria and urgency Musculoskeletal: Negative for back pain, joint pain, myalgias  Neurological: Negative for dizziness and headaches    Objective:     Vitals:   01/31/18 1524  BP: 110/72  Pulse: 87   General:  alert, cooperative and no distress   Breasts:  negative  Lungs: Normal respiratory effort  Heart:  regular rate and rhythm  Abdomen: Soft, nontender  Declined pelvic                         Assessment:    normal postpartum  exam.  Plan:   1. Contraception: IUD 2. Follow up in:   or as needed.

## 2018-02-01 ENCOUNTER — Other Ambulatory Visit: Payer: Self-pay | Admitting: Obstetrics & Gynecology

## 2018-02-01 ENCOUNTER — Telehealth: Payer: Self-pay | Admitting: *Deleted

## 2018-02-01 MED ORDER — SULFAMETHOXAZOLE-TRIMETHOPRIM 800-160 MG PO TABS: 1.0000 | ORAL_TABLET | Freq: Two times a day (BID) | ORAL | 0 refills | Status: DC

## 2018-02-01 NOTE — Telephone Encounter (Signed)
Bactrim ds e prescribed 

## 2018-02-02 LAB — URINE CULTURE

## 2018-02-04 ENCOUNTER — Other Ambulatory Visit: Payer: Self-pay | Admitting: Obstetrics & Gynecology

## 2018-02-04 NOTE — Telephone Encounter (Signed)
Pt aware Bactrim was sent to pharmacy. JSY

## 2018-02-14 ENCOUNTER — Ambulatory Visit (INDEPENDENT_AMBULATORY_CARE_PROVIDER_SITE_OTHER): Payer: Medicaid Other | Admitting: Advanced Practice Midwife

## 2018-02-14 ENCOUNTER — Encounter: Payer: Self-pay | Admitting: Advanced Practice Midwife

## 2018-02-14 DIAGNOSIS — Z3202 Encounter for pregnancy test, result negative: Secondary | ICD-10-CM

## 2018-02-14 DIAGNOSIS — Z30430 Encounter for insertion of intrauterine contraceptive device: Secondary | ICD-10-CM | POA: Insufficient documentation

## 2018-02-14 LAB — POCT URINE PREGNANCY: Preg Test, Ur: NEGATIVE

## 2018-02-14 MED ORDER — PARAGARD INTRAUTERINE COPPER IU IUD
1.0000 | INTRAUTERINE_SYSTEM | Freq: Once | INTRAUTERINE | Status: AC
  Administered 2018-02-14: 1 via INTRAUTERINE

## 2018-02-14 NOTE — Addendum Note (Signed)
Addended by: Tish FredericksonLANCASTER, Jameer Storie A on: 02/14/2018 04:45 PM   Modules accepted: Orders

## 2018-02-14 NOTE — Progress Notes (Signed)
Theresa Shaw is a 27 y.o. year old  female Gravida 2 Para 2  who presents for placement of a Paragard IUD. She hasn't been sexually active since delivery and her pregnancy test today is negative.    The risks and benefits of the method and placement have been thouroughly reviewed with the patient and all questions were answered.  Specifically the patient is aware of failure rate of 05/998, expulsion of the IUD and of possible perforation.  The patient is aware of irregular bleeding due to the method and understands the incidence of irregular bleeding diminishes with time.  Time out was performed.  A Graves speculum was placed.  The cervix was prepped using Betadine. The uterus was found to be neutral and it sounded to 8 cm.  The cervix was grasped with a tenaculum and the IUD was inserted to 8 cm.  It was pulled back 1 cm and the IUD was disengaged.  The strings were trimmed to 3 cm.  Sonogram was performed and the proper placement of the IUD was verified.  The patient was instructed on signs and symptoms of infection and to check for the strings after each menses or each month.  The patient is to refrain from intercourse for 3 days.  The patient is scheduled for a return appointment after her first menses or 4 weeks.  Theresa Shaw 02/14/2018 3:55 PM

## 2018-03-12 ENCOUNTER — Encounter: Payer: Self-pay | Admitting: Obstetrics and Gynecology

## 2018-03-13 ENCOUNTER — Encounter: Payer: Self-pay | Admitting: Obstetrics and Gynecology

## 2018-04-04 ENCOUNTER — Ambulatory Visit: Payer: Medicaid Other | Attending: Obstetrics and Gynecology | Admitting: Physical Therapy

## 2018-05-02 ENCOUNTER — Encounter: Payer: Self-pay | Admitting: Obstetrics and Gynecology

## 2018-05-07 ENCOUNTER — Telehealth: Payer: Self-pay | Admitting: Family Medicine

## 2018-05-07 NOTE — Telephone Encounter (Signed)
Pt contacted office stating that she fell out of the bath tub yesterday. Pt states her left leg is now swollen and she states she is pretty such she broke or fractured something. Pt states she has her 84 month old with her and does not want to drag her out to the office or ER. Pt advised that we were full and to go to ER to have x-rays. Pt wanted provider to place order for x ray but patient was told that she would need to be seen first but since leg was swelling she needed to go to ER. Pt verbalized understanding.

## 2018-05-07 NOTE — Telephone Encounter (Signed)
I agree she ought to go to the ER to get a urgent care today scheduled totally full through 5 PM

## 2018-05-08 ENCOUNTER — Emergency Department (HOSPITAL_COMMUNITY)
Admission: EM | Admit: 2018-05-08 | Discharge: 2018-05-09 | Disposition: A | Discharge status: Home / Self Care | Payer: Medicaid Other | Attending: Emergency Medicine | Admitting: Emergency Medicine

## 2018-05-08 ENCOUNTER — Emergency Department (HOSPITAL_COMMUNITY): Payer: Medicaid Other

## 2018-05-08 ENCOUNTER — Other Ambulatory Visit: Payer: Self-pay

## 2018-05-08 ENCOUNTER — Encounter (HOSPITAL_COMMUNITY): Payer: Self-pay | Admitting: Emergency Medicine

## 2018-05-08 DIAGNOSIS — S8992XA Unspecified injury of left lower leg, initial encounter: Secondary | ICD-10-CM | POA: Diagnosis present

## 2018-05-08 DIAGNOSIS — S82892A Other fracture of left lower leg, initial encounter for closed fracture: Secondary | ICD-10-CM

## 2018-05-08 DIAGNOSIS — W0110XA Fall on same level from slipping, tripping and stumbling with subsequent striking against unspecified object, initial encounter: Secondary | ICD-10-CM | POA: Insufficient documentation

## 2018-05-08 DIAGNOSIS — Y9301 Activity, walking, marching and hiking: Secondary | ICD-10-CM | POA: Diagnosis not present

## 2018-05-08 DIAGNOSIS — Z87891 Personal history of nicotine dependence: Secondary | ICD-10-CM | POA: Insufficient documentation

## 2018-05-08 DIAGNOSIS — Z79899 Other long term (current) drug therapy: Secondary | ICD-10-CM | POA: Insufficient documentation

## 2018-05-08 DIAGNOSIS — R0602 Shortness of breath: Secondary | ICD-10-CM | POA: Insufficient documentation

## 2018-05-08 DIAGNOSIS — Y999 Unspecified external cause status: Secondary | ICD-10-CM | POA: Insufficient documentation

## 2018-05-08 DIAGNOSIS — Y929 Unspecified place or not applicable: Secondary | ICD-10-CM | POA: Diagnosis not present

## 2018-05-08 DIAGNOSIS — R52 Pain, unspecified: Secondary | ICD-10-CM

## 2018-05-08 NOTE — ED Triage Notes (Signed)
Pt reports that she fell x 3 days ago, left ankle swollen and warm to touch. Afebrile. Pt concerned she has a blood clot, hx of same, also c/o shortness of breath.

## 2018-05-09 MED ORDER — HYDROCODONE-ACETAMINOPHEN 5-325 MG PO TABS: 1.0000 | ORAL_TABLET | Freq: Three times a day (TID) | ORAL | 0 refills | Status: AC | PRN

## 2018-05-09 MED ORDER — HYDROCODONE-ACETAMINOPHEN 5-325 MG PO TABS
2.0000 | ORAL_TABLET | Freq: Once | ORAL | Status: AC
  Administered 2018-05-09: 2 via ORAL
  Filled 2018-05-09: qty 2

## 2018-05-09 NOTE — Progress Notes (Signed)
Orthopedic Tech Progress Note Patient Details:  Theresa Shaw 07/02/1990 [id number]  Ortho Devices Type of Ortho Device: Ace wrap, Stirrup splint, Post (short leg) splint Ortho Device/Splint Interventions: Application   Post Interventions Patient Tolerated: Well Instructions Provided: Care of device   Saul FordyceJennifer C Lula Michaux 05/09/2018, 12:48 AM

## 2018-05-09 NOTE — Discharge Instructions (Signed)
X-ray shows that you have a fracture of your ankle. Please keep the leg elevated. Follow-up with the orthopedist in 10 to 14 days.

## 2018-05-09 NOTE — ED Notes (Signed)
Patient verbalizes understanding of medications and discharge instructions. No further questions at this time. VSS and patient ambulatory at discharge.   
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
## 2018-06-04 ENCOUNTER — Ambulatory Visit: Payer: Medicaid Other | Admitting: Physical Therapy

## 2018-06-11 ENCOUNTER — Ambulatory Visit (INDEPENDENT_AMBULATORY_CARE_PROVIDER_SITE_OTHER): Payer: Medicaid Other | Admitting: Orthopaedic Surgery

## 2018-06-11 ENCOUNTER — Encounter (INDEPENDENT_AMBULATORY_CARE_PROVIDER_SITE_OTHER): Payer: Self-pay | Admitting: Orthopaedic Surgery

## 2018-06-11 ENCOUNTER — Ambulatory Visit (INDEPENDENT_AMBULATORY_CARE_PROVIDER_SITE_OTHER): Payer: Medicaid Other

## 2018-06-11 DIAGNOSIS — M25572 Pain in left ankle and joints of left foot: Secondary | ICD-10-CM
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
## 2018-07-09 ENCOUNTER — Ambulatory Visit (INDEPENDENT_AMBULATORY_CARE_PROVIDER_SITE_OTHER): Payer: Self-pay | Admitting: Orthopaedic Surgery

## 2018-10-16 ENCOUNTER — Telehealth: Payer: Self-pay | Admitting: Obstetrics and Gynecology

## 2018-10-16 NOTE — Telephone Encounter (Signed)
Pts significant other calling to see if pt can have an appt for a possible issue with her IUD. He states she has not had a menstrual cycle in a few months. He is worried she may be pregnant. Advised him to have her take a home pregnancy test. He would like placement of the IUD checked. He is requesting a nurse to call.

## 2018-10-16 NOTE — Telephone Encounter (Signed)
Call can't be completed @ 5:16 pm. JSY

## 2018-10-16 NOTE — Telephone Encounter (Signed)
Call can't be completed @ 1:37 pm. JSY

## 2018-10-17 NOTE — Telephone Encounter (Signed)
Call can't be completed @ 10:45 am. 3 attempts to reach pt. Encounter closed. JSY

## 2018-10-24 ENCOUNTER — Telehealth: Payer: Self-pay | Admitting: Obstetrics & Gynecology

## 2018-10-24 NOTE — Telephone Encounter (Signed)
Please call needs to talk about making an appt/ has not had a cycle in 5 months / neg preg test also needs another order for Wheelchair to [facility] Fax [phone]

## 2018-10-25 NOTE — Telephone Encounter (Signed)
She can be scheduled for a pelvic TV u/s, with Amber, to confirm IUD location. Followup by phone

## 2018-11-01 ENCOUNTER — Other Ambulatory Visit: Payer: Self-pay | Admitting: Obstetrics and Gynecology

## 2018-11-01 DIAGNOSIS — N912 Amenorrhea, unspecified: Secondary | ICD-10-CM

## 2018-11-01 DIAGNOSIS — Z30431 Encounter for routine checking of intrauterine contraceptive device: Secondary | ICD-10-CM

## 2018-11-04 ENCOUNTER — Other Ambulatory Visit: Payer: Medicaid Other

## 2018-11-14 ENCOUNTER — Telehealth: Payer: Self-pay | Admitting: Obstetrics & Gynecology

## 2018-11-14 NOTE — Telephone Encounter (Signed)

## 2018-11-18 ENCOUNTER — Other Ambulatory Visit: Payer: Medicaid Other

## 2018-11-19 ENCOUNTER — Ambulatory Visit (INDEPENDENT_AMBULATORY_CARE_PROVIDER_SITE_OTHER): Payer: Medicaid Other | Admitting: Family Medicine

## 2018-11-19 ENCOUNTER — Other Ambulatory Visit: Payer: Self-pay

## 2018-11-19 DIAGNOSIS — R5383 Other fatigue: Secondary | ICD-10-CM | POA: Diagnosis not present

## 2018-11-19 DIAGNOSIS — Z131 Encounter for screening for diabetes mellitus: Secondary | ICD-10-CM

## 2018-11-19 DIAGNOSIS — G8220 Paraplegia, unspecified: Secondary | ICD-10-CM | POA: Diagnosis not present

## 2018-11-19 DIAGNOSIS — R635 Abnormal weight gain: Secondary | ICD-10-CM | POA: Diagnosis not present

## 2018-11-19 DIAGNOSIS — D6489 Other specified anemias: Secondary | ICD-10-CM

## 2018-11-19 NOTE — Progress Notes (Signed)
   Subjective:    Patient ID: Theresa Shaw, female    DOB: 20-Apr-1991, 28 y.o.   MRN: [id number]  HPIpt wants to get order for new wheelchair.  Patient has paraplegia.  Denies any other particular troubles.  Needs a rehab wheelchair.  States her overall health is doing well.   Nose stopped up. Using otc nasal spray for 2 months.  We did talk about allergy related issues.  It is best for the patient to get away from regular decongestant nasal sprays Virtual Visit via Telephone Note  I connected with Theresa Shaw on 11/19/18 at  1:40 PM EDT by telephone and verified that I am speaking with the correct person using two identifiers.  Location: Patient: home Provider: office   I discussed the limitations, risks, security and privacy concerns of performing an evaluation and management service by telephone and the availability of in person appointments. I also discussed with the patient that there may be a patient responsible charge related to this service. The patient expressed understanding and agreed to proceed.   History of Present Illness:    Observations/Objective:   Assessment and Plan:   Follow Up Instructions:    I discussed the assessment and treatment plan with the patient. The patient was provided an opportunity to ask questions and all were answered. The patient agreed with the plan and demonstrated an understanding of the instructions.   The patient was advised to call back or seek an in-person evaluation if the symptoms worsen or if the condition fails to improve as anticipated.  I provided 15 minutes of non-face-to-face time during this encounter.       Review of Systems  Constitutional: Negative for activity change and appetite change.  HENT: Negative for congestion and rhinorrhea.   Respiratory: Negative for cough and shortness of breath.   Cardiovascular: Negative for chest pain and leg swelling.  Gastrointestinal: Negative for abdominal pain, nausea  and vomiting.  Skin: Negative for color change.  Neurological: Negative for dizziness and weakness.  Psychiatric/Behavioral: Negative for agitation and confusion.       Objective:   Physical Exam   Today's visit was via telephone Physical exam was not possible for this visit      Assessment & Plan:  Significant weight gain we talked about diet physical activity is difficult because she is paraplegic we will check lab work to look at her thyroid and sugar  Has history of anemia we will check CBC  Paraplegia she does need a new wheelchair we will write out a prescription for this and send to them Fax number [phone]  As for the nasal congestion I recommended Flonase and over-the-counter loratadine we can send Flonase sent for her prescription

## 2018-11-20 MED ORDER — FLUTICASONE PROPIONATE 50 MCG/ACT NA SUSP: 2.0000 | Freq: Every day | NASAL | 5 refills | Status: DC

## 2018-11-20 NOTE — Progress Notes (Signed)
Left message to return call 

## 2018-11-21 NOTE — Progress Notes (Signed)
Tried to call no answer

## 2018-11-22 NOTE — Progress Notes (Signed)
Discussed with pt. Pt verbalized understanding.  °

## 2018-12-02 ENCOUNTER — Encounter: Payer: Medicaid Other | Admitting: Obstetrics & Gynecology

## 2018-12-09 ENCOUNTER — Other Ambulatory Visit: Payer: Self-pay

## 2018-12-09 ENCOUNTER — Encounter: Payer: Self-pay | Admitting: Obstetrics and Gynecology

## 2018-12-09 ENCOUNTER — Ambulatory Visit (INDEPENDENT_AMBULATORY_CARE_PROVIDER_SITE_OTHER): Payer: Medicaid Other | Admitting: Obstetrics and Gynecology

## 2018-12-09 VITALS — BP 131/84 | HR 76

## 2018-12-09 DIAGNOSIS — N939 Abnormal uterine and vaginal bleeding, unspecified: Secondary | ICD-10-CM | POA: Diagnosis not present

## 2018-12-09 DIAGNOSIS — R635 Abnormal weight gain: Secondary | ICD-10-CM | POA: Diagnosis not present

## 2018-12-09 DIAGNOSIS — Z3202 Encounter for pregnancy test, result negative: Secondary | ICD-10-CM | POA: Diagnosis not present

## 2018-12-09 LAB — POCT URINE PREGNANCY: Preg Test, Ur: NEGATIVE

## 2018-12-09 MED ORDER — MEGESTROL ACETATE 40 MG PO TABS: 40.0000 mg | ORAL_TABLET | Freq: Two times a day (BID) | ORAL | 2 refills | Status: DC
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
## 2018-12-26 ENCOUNTER — Telehealth: Payer: Self-pay | Admitting: Obstetrics and Gynecology

## 2018-12-26 NOTE — Telephone Encounter (Signed)
Pt states she came in 2 weeks for abnormal bleeding and she was prescribed megace and states she did not take it because it could have killed her. Pt states she is still bleeding and wants an appt no later than on Monday. Please advise.

## 2018-12-27 NOTE — Telephone Encounter (Signed)
Unable to reach pt on cell.Unable to leave message due to  Full mailbox. Unable to reach pt by home number, which is someone else's cell. Pt number will be called later today. Megace Rx stopped.

## 2018-12-29 ENCOUNTER — Telehealth: Payer: Self-pay | Admitting: Obstetrics and Gynecology

## 2018-12-29 NOTE — Telephone Encounter (Signed)
Unable to reach pt on either mobile or home phone.

## 2018-12-29 NOTE — Telephone Encounter (Signed)
I have been unable to reach pt x 2 efforts, as both home phone and cell phone will not accept messages. Please try to call a couple of times this week.

## 2018-12-31 NOTE — Telephone Encounter (Signed)
Pt states to please call her later than 8am as she is sleeping. She states that she is still bleeding and that megace almost killed her in the past and she cannot take the medication. Please advise.

## 2019-01-02 NOTE — Telephone Encounter (Signed)
Called patient, no answer and option to leave vm.

## 2019-02-21 ENCOUNTER — Telehealth: Payer: Self-pay | Admitting: *Deleted

## 2019-02-21 NOTE — Telephone Encounter (Signed)
Pt states that she has been bleeding for 4 months. She also says she is due for a colposcopy and wants a visit scheduled for that. Per chart review patient was supposed to have colpo 8 weeks postpartum. Last pap was 05/2017.

## 2019-02-25 NOTE — Telephone Encounter (Signed)
Daisy to call patient for appt.

## 2019-02-25 NOTE — Telephone Encounter (Signed)
She needs a Pap appt

## 2019-03-05 ENCOUNTER — Telehealth: Payer: Self-pay | Admitting: Advanced Practice Midwife

## 2019-03-05 NOTE — Telephone Encounter (Signed)

## 2019-03-06 ENCOUNTER — Encounter: Payer: Self-pay | Admitting: Advanced Practice Midwife

## 2019-03-06 ENCOUNTER — Other Ambulatory Visit: Payer: Self-pay | Admitting: Advanced Practice Midwife

## 2019-03-06 NOTE — Progress Notes (Unsigned)
Pt sent photo of what appears to be a decidual cast w/and IUD via facebook messenger, found on 10/6. I responded to pt and told her what I thought it was and that she is probably fertile. Had appt for a pap on 10/8 that she did not come for. Pt messaged again on facebook and I reiterated that she needed to keep pap appt and also to use condoms if she had sex (although abstinence advised).

## 2019-03-26 ENCOUNTER — Telehealth: Payer: Self-pay | Admitting: Advanced Practice Midwife

## 2019-03-26 NOTE — Telephone Encounter (Signed)
Unable to leave message or reach pt with restrictions.  

## 2019-03-27 ENCOUNTER — Other Ambulatory Visit: Payer: Medicaid Other | Admitting: Advanced Practice Midwife

## 2019-04-02 ENCOUNTER — Ambulatory Visit (HOSPITAL_COMMUNITY): Payer: Medicaid Other | Attending: Family Medicine

## 2019-04-02 ENCOUNTER — Other Ambulatory Visit: Payer: Self-pay

## 2019-04-02 DIAGNOSIS — R29898 Other symptoms and signs involving the musculoskeletal system: Secondary | ICD-10-CM | POA: Diagnosis present

## 2019-04-03 ENCOUNTER — Encounter (HOSPITAL_COMMUNITY): Payer: Self-pay
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
## 2019-04-14 ENCOUNTER — Other Ambulatory Visit: Payer: Medicaid Other | Admitting: Advanced Practice Midwife

## 2019-06-09 ENCOUNTER — Ambulatory Visit (INDEPENDENT_AMBULATORY_CARE_PROVIDER_SITE_OTHER): Payer: Medicaid Other | Admitting: Family Medicine

## 2019-06-09 ENCOUNTER — Other Ambulatory Visit: Payer: Self-pay

## 2019-06-09 VITALS — Ht 66.0 in | Wt 215.0 lb

## 2019-06-09 DIAGNOSIS — R635 Abnormal weight gain: Secondary | ICD-10-CM | POA: Diagnosis not present

## 2019-06-09 DIAGNOSIS — D72829 Elevated white blood cell count, unspecified: Secondary | ICD-10-CM | POA: Diagnosis not present

## 2019-06-09 DIAGNOSIS — G8220 Paraplegia, unspecified: Secondary | ICD-10-CM

## 2019-06-09 NOTE — Progress Notes (Addendum)
   Subjective:    Patient ID: Theresa Shaw, female    DOB: Oct 28, 1990, 29 y.o.   MRN: [id number]  HPI  Video evaluation Face-to-face evaluation Mobility assessment for a ultralight weight manual wheelchair This wheelchair is necessary because of her paraplegia In my opinion this wheelchair would help her with home ADLs such as cooking cleaning and toiletry use Due to Covid and patient at higher risk of Covid face-to-face evaluation was done via video Patient is disabled She needs a wheelchair ultralight weight because of her size and weakness in her arms. The chair is necessary for her to be able to properly function.  Is necessary and will help assist the patient with home ADLs to include cooking, self cleaning, housecleaning, and toiletry. Pt is needing a wheelchair due to being paralyzed. Pt had a baby last year and has gained weight. Pt states that form is being faxed over by [facility].   Virtual Visit via Telephone Note  I connected with Theresa Shaw on 06/09/19 at  1:40 PM EST by telephone and verified that I am speaking with the correct person using two identifiers.  Location: Patient: home Provider: office   I discussed the limitations, risks, security and privacy concerns of performing an evaluation and management service by telephone and the availability of in person appointments. I also discussed with the patient that there may be a patient responsible charge related to this service. The patient expressed understanding and agreed to proceed. I have read over the notes from the physical therapy evaluation and I agree with the physical therapy evaluation  History of Present Illness:    Observations/Objective:   Assessment and Plan:   Follow Up Instructions: Patient is 5 foot 6 and weighs 215 pounds   I discussed the assessment and treatment plan with the patient. The patient was provided an opportunity to ask questions and all were answered. The patient  agreed with the plan and demonstrated an understanding of the instructions.   The patient was advised to call back or seek an in-person evaluation if the symptoms worsen or if the condition fails to improve as anticipated.  I provided 17 minutes of non-face-to-face time during this encounter.       Review of Systems  Constitutional: Negative for activity change and appetite change.  HENT: Negative for congestion and rhinorrhea.   Respiratory: Negative for cough and shortness of breath.   Cardiovascular: Negative for chest pain and leg swelling.  Gastrointestinal: Negative for abdominal pain, nausea and vomiting.  Musculoskeletal: Positive for back pain.  Skin: Negative for color change.  Neurological: Negative for dizziness and weakness.  Psychiatric/Behavioral: Negative for agitation and confusion.       Objective:   Physical Exam Current weight 215 pounds Current height 5 foot 6 inches Patient had virtual visit Appears to be in no distress Atraumatic Neuro able to relate and oriented No apparent resp distress Color normal Patient is paraplegic is wheelchair-bound  Her current wheelchair is worn out and is causing very much difficulty with meeting her ADLs     Assessment & Plan:  New wheelchair indicated Face-to-face evaluation today Forms were filled out Patient will follow up here on a as needed basis and at least a yearly basis. Wheelchair is necessary Cultural lightweight manual wheelchair will assist the patient with her home ADLs.  This is not limited to the following cooking, toileting, cleaning, self cleaning.  Her wheelchair is medically indicated  Patient due for lab work.

## 2019-06-10 ENCOUNTER — Ambulatory Visit: Payer: Medicaid Other | Admitting: Family Medicine

## 2019-06-13 ENCOUNTER — Telehealth: Payer: Self-pay | Admitting: Family Medicine

## 2019-06-13 NOTE — Telephone Encounter (Signed)
Pt's husband is calling back to ask we resend [facility] the paperwork needed for her to get a new wheelchair. Who ever received it at [facility] didn't place it where it was suppose to go and they do not have it.

## 2019-06-16 ENCOUNTER — Telehealth: Payer: Self-pay | Admitting: Family Medicine

## 2019-06-16 NOTE — Telephone Encounter (Signed)
Patient  Is checking paper work for new wheelchair. Forms were faxed over day of appointment on 1/11 but havent came upfront yet to fax back. Please advise

## 2019-06-17 NOTE — Telephone Encounter (Signed)
Paper work and recent notes faxed over today

## 2019-07-07 ENCOUNTER — Telehealth: Payer: Self-pay | Admitting: Family Medicine

## 2019-07-08 NOTE — Telephone Encounter (Signed)
[facility] is checking on forms that were sent back on 2/4 and needing prescription along with paper work. Her prescription has expired now and completely out. Needing today if possible

## 2019-07-08 NOTE — Telephone Encounter (Signed)
Form for catheters

## 2019-07-08 NOTE — Telephone Encounter (Signed)
So I do recall seeing these forms I also recall filling in the form I do not see them on my desk I would recommend looking through the green folders If for some reason the form is gone missing please have them fax over another 1 we can fill it out today

## 2019-10-22 ENCOUNTER — Telehealth: Payer: Self-pay | Admitting: Family Medicine

## 2019-10-22 NOTE — Telephone Encounter (Signed)
lmtc

## 2019-10-22 NOTE — Telephone Encounter (Signed)
Patient wanted to have lab request sent back to [facility] to have Thyroid checked

## 2019-10-23 NOTE — Telephone Encounter (Signed)
Orders were put in on 06/09/19 and I called and told pt she could go over to lab and get those done.

## 2020-07-20 ENCOUNTER — Other Ambulatory Visit: Payer: Self-pay

## 2020-07-20 ENCOUNTER — Other Ambulatory Visit: Payer: Self-pay | Admitting: Adult Health

## 2020-07-20 MED ORDER — NITROFURANTOIN MONOHYD MACRO 100 MG PO CAPS: 100.0000 mg | ORAL_CAPSULE | Freq: Two times a day (BID) | ORAL | 0 refills | Status: DC

## 2020-07-20 NOTE — Progress Notes (Signed)
She had blood in her urine and it had odor, will rx macrobid,urine sent for culture

## 2020-07-23 LAB — URINE CULTURE

## 2021-01-06 ENCOUNTER — Telehealth: Payer: Self-pay | Admitting: Family Medicine

## 2021-01-06 NOTE — Telephone Encounter (Signed)
Patient called in requesting an antibiotic for her ankle. She states that Dr. Lorin Picket is very familiar with her and that he will normally call in something.  I advised patient that she would need to go to [facility] since we don't have any availability but she insist that I send a message.   CB# [phone]

## 2021-01-06 NOTE — Telephone Encounter (Signed)
Per Dr Lorin Picket: Patient needs office visit for evaluation- Patient scheduled office visit 01/07/21 at 4:15pm with Dr Lorin Picket.

## 2021-01-07 ENCOUNTER — Ambulatory Visit: Payer: Medicaid Other | Admitting: Family Medicine

## 2021-01-07 ENCOUNTER — Encounter: Payer: Self-pay | Admitting: Family Medicine

## 2021-03-28 ENCOUNTER — Telehealth: Payer: Self-pay | Admitting: Family Medicine

## 2021-03-28 NOTE — Telephone Encounter (Signed)
Please advise. Thank you

## 2021-03-28 NOTE — Telephone Encounter (Signed)
Tish with [facility] stated they sent back a DMR that was signed but not dated. She inquired about when she might would get that. Please advise.  CB#  [phone]  ext [phone]

## 2021-04-03 NOTE — Telephone Encounter (Signed)
I have signed off on all sent to me thanks If they have to resend it so be it thanks

## 2021-04-05 NOTE — Telephone Encounter (Signed)
Spoke with [facility] and they stated they received what thy needed via fax 04/01/21 and have everything they need for the patient at this time.

## 2021-09-14 ENCOUNTER — Telehealth: Payer: Medicaid Other | Admitting: Nurse Practitioner

## 2021-09-14 ENCOUNTER — Encounter: Payer: Self-pay | Admitting: Family Medicine

## 2021-09-14 DIAGNOSIS — R21 Rash and other nonspecific skin eruption: Secondary | ICD-10-CM

## 2021-09-14 NOTE — Telephone Encounter (Signed)
Nurses ?Please connect with patient so that she can do a MyChart video visit with Hillary Bow late this afternoon. ?Currently my schedule is full but Hillary Bow our nurse practitioner does have openings ? ?As for the no-shows please forward this message to the office manager-Shannon ?She can help her with this issue thanks ? ?If Annahi is unable to do the video visit late this afternoon I definitely would want to do a video visit with her tomorrow thank you ?

## 2021-09-14 NOTE — Progress Notes (Signed)
°  Based on what you shared with me, I feel your condition warrants further evaluation and I recommend that you be seen in a face to face visit. °  °NOTE: There will be NO CHARGE for this eVisit °  °If you are having a true medical emergency please call 911.   °  ° For an urgent face to face visit, [facility] has six urgent care centers for your convenience:  °  ° [facility] °Get Driving Directions °[phone] °[address] °[place], [place] [address] °  ° [facility]) °Get Driving Directions °[phone] °[address] °[place], [place] [address] ° °[facility] [facility] [place] °Get Driving Directions °[phone] °[address] °[place],  [place]  [address] ° °[facility] °Get Driving Directions °[phone] °[address] °[place], [place] [address] °  °[facility] [place] °Get Driving Directions  °[phone] °[address] °[address] °[place], [place] [address] °  °[facility] °Get Driving Directions °[phone] °[address] °, [place] [address] ° °Your MyChart E-visit questionnaire answers were reviewed by a board certified advanced clinical practitioner to complete your personal care plan based on your specific symptoms.  Thank you for using e-Visits. °

## 2022-05-10 ENCOUNTER — Telehealth: Payer: Self-pay | Admitting: Family Medicine

## 2022-05-10 NOTE — Telephone Encounter (Signed)
Please let pt know. Thank you.

## 2022-05-10 NOTE — Telephone Encounter (Signed)
Patient is wanting a video visit due to her transportation issues but she hasn't had a face to face visit since 06/09/19. Its always the same story with transportation.Please advise

## 2022-05-10 NOTE — Telephone Encounter (Signed)
Nurses-if this is more so a medicine follow-up and not having any health issues that require a hands-on exam we can do a video visit but she will need to understand that we will need to do a in person visit at least by springtime  If she is having health issues that require on site and exam it would be best to be seen in person thank you

## 2022-05-15 ENCOUNTER — Telehealth (INDEPENDENT_AMBULATORY_CARE_PROVIDER_SITE_OTHER): Payer: Medicaid Other | Admitting: Family Medicine

## 2022-05-15 ENCOUNTER — Encounter: Payer: Self-pay | Admitting: Family Medicine

## 2022-05-15 DIAGNOSIS — N3000 Acute cystitis without hematuria: Secondary | ICD-10-CM | POA: Diagnosis not present

## 2022-05-15 DIAGNOSIS — G8220 Paraplegia, unspecified: Secondary | ICD-10-CM | POA: Diagnosis not present

## 2022-05-15 DIAGNOSIS — N319 Neuromuscular dysfunction of bladder, unspecified: Secondary | ICD-10-CM

## 2022-05-15 DIAGNOSIS — F1120 Opioid dependence, uncomplicated: Secondary | ICD-10-CM | POA: Diagnosis not present

## 2022-05-15 MED ORDER — IBUPROFEN 600 MG PO TABS: 600.0000 mg | ORAL_TABLET | Freq: Three times a day (TID) | ORAL | 0 refills | Status: DC | PRN

## 2022-05-15 MED ORDER — CEFDINIR 300 MG PO CAPS: 300.0000 mg | ORAL_CAPSULE | Freq: Two times a day (BID) | ORAL | 0 refills | Status: DC

## 2022-05-15 NOTE — Progress Notes (Addendum)
   Subjective:    Patient ID: Theresa Shaw, female    DOB: 1991/01/01, 31 y.o.   MRN: [id number]  HPI This verifies that the history review of any tests, physical exam, assessment and plan were conducted by Dr. Lilyan Punt and documented accordingly by him today Lilyan Punt MD primary care [facility] family medicine  Virtual Visit via Video Note  I connected with Theresa Shaw on 06/18/22 at 10:40 AM EST by a video enabled telemedicine application and verified that I am speaking with the correct person using two identifiers.  Location: Patient: Home Provider: Office   I discussed the limitations of evaluation and management by telemedicine and the availability of in person appointments. The patient expressed understanding and agreed to proceed.  History of Present Illness:    Observations/Objective:   Assessment and Plan:   Follow Up Instructions:    I discussed the assessment and treatment plan with the patient. The patient was provided an opportunity to ask questions and all were answered. The patient agreed with the plan and demonstrated an understanding of the instructions.   The patient was advised to call back or seek an in-person evaluation if the symptoms worsen or if the condition fails to improve as anticipated.  I provided 15 minutes of non-face-to-face time during this encounter.   Lilyan Punt, MD  Catheter supplies form needs completed Left hand pain uses to work repetitive motion- was d/c from cymbalta a couple of moths ago  Patient with some wrist pain discomfort hurts with certain movements Patient is paraplegic Does the best he can Has history of drug abuse but is staying away from illicit drugs for over 8 years She is currently on bupropion to prevent opioid withdrawal She also has underlying anxiety and depression issues and being treated by specialist for that Review of Systems     Objective:   Physical Exam Patient had virtual  visit-video Appears to be in no distress Atraumatic Neuro able to relate and oriented  She will send [place] the information regarding the catheters we will follow-up when we get it No apparent resp distress Color normal      Assessment & Plan:  1. Neurogenic bladder In-N-Out catheters are necessary She uses size 14 French catheters She does approximately 6-8 and outs per day   2. Uncomplicated opioid dependence (HCC) Go ahead with current treatments.  Patient doing a great job staying away from IV drugs  3. Paraplegia [facility]) Patient manages the best she can  UTI-antibiotics prescribed  Patient has autistic child makes it very difficult for her to come to the office

## 2022-05-16 NOTE — Telephone Encounter (Signed)
Nurses Please write this out on a prescription I will sign it.  I also recommend sending the documentation from the video visit with this prescription to this company in order to get it approved thank you

## 2022-05-17 NOTE — Telephone Encounter (Signed)
Script written out and placed on provider door for signature. Video visit notes printed off and will be faxed with script once signed

## 2022-07-25 ENCOUNTER — Telehealth: Payer: Self-pay | Admitting: Family Medicine

## 2022-07-25 NOTE — Telephone Encounter (Signed)
Patient is calling in reference to her catheter. She states she came in a month or so ago about asking for it.  CB# [phone]

## 2022-07-25 NOTE — Telephone Encounter (Signed)
Telephone call mailbox is full  [place] [phone]  [phone]   Which is information given by patient 05/15/22 in my chart for her catheter supplies.  They stated they manufacture catheters but do not supply directly to patients Tried to contact patient to find out which company delivers her catheter supplies so a new script could be sent

## 2022-08-01 NOTE — Telephone Encounter (Signed)
Form received from [employer] and completed by provider

## 2023-01-24 ENCOUNTER — Ambulatory Visit: Payer: MEDICAID | Admitting: Adult Health

## 2023-06-15 ENCOUNTER — Telehealth: Payer: Self-pay | Admitting: *Deleted

## 2023-06-15 NOTE — Telephone Encounter (Signed)
Received form for catheter supplies  Last seen virtual visit 04/2022- patient needs in person visit with provider for completion of forms and evaluation  Left message to return call to notify patient and schedule appointment

## 2023-06-20 NOTE — Telephone Encounter (Signed)
Left message for patient to return the call to schedule an appt for catheter supply form.

## 2023-06-27 NOTE — Telephone Encounter (Signed)
Left message for patient to return the call needs an appt for form completion.

## 2023-07-06 NOTE — Telephone Encounter (Signed)
 Left message for patient to return the call for additional details and recommendations.  Pt will need an appt per notes.

## 2023-07-19 NOTE — Telephone Encounter (Signed)
Patient scheduled office visit 07/26/23 with Eber Jones NP

## 2023-07-26 ENCOUNTER — Ambulatory Visit (INDEPENDENT_AMBULATORY_CARE_PROVIDER_SITE_OTHER): Payer: MEDICAID | Admitting: Family Medicine

## 2023-07-26 DIAGNOSIS — E669 Obesity, unspecified: Secondary | ICD-10-CM | POA: Insufficient documentation

## 2023-07-26 DIAGNOSIS — Z79899 Other long term (current) drug therapy: Secondary | ICD-10-CM | POA: Diagnosis not present

## 2023-07-26 DIAGNOSIS — Z13220 Encounter for screening for lipoid disorders: Secondary | ICD-10-CM

## 2023-07-26 DIAGNOSIS — Z862 Personal history of diseases of the blood and blood-forming organs and certain disorders involving the immune mechanism: Secondary | ICD-10-CM

## 2023-07-26 DIAGNOSIS — N319 Neuromuscular dysfunction of bladder, unspecified: Secondary | ICD-10-CM | POA: Diagnosis not present

## 2023-07-26 NOTE — Assessment & Plan Note (Addendum)
 I did not feel that insurance will cover [phone].  Defer prescribing to her primary care physician. Labs ordered today.

## 2023-07-26 NOTE — Progress Notes (Signed)
 Subjective:  Patient ID: Theresa Shaw, female    DOB: 04/01/1991  Age: 33 y.o. MRN: [id number]  CC: Need for form filled out for catheters   HPI:  33 year old female with paraplegia following T7 spinal cord injury with neurogenic bladder presents for evaluation of the above.  Patient needs a form filled out in regards to getting her catheters filled.  Patient intermittently caths approximately 6 times a day.  She is doing well with this.  Patient reports weight gain and difficulty losing weight.  Patient has inquiring about Wegovy.  Additionally, patient is concerned that she may have thyroid issues and that is what has prompted weight gain.  She is requesting lab work today.  Patient Active Problem List   Diagnosis Date Noted   Obesity (BMI 30-39.9) 07/26/2023   Abnormal Pap smear of cervix 06/27/2017   Hepatitis C antibody test positive 02/04/2016   Chronic pain syndrome 01/14/2016   Bipolar I disorder, most recent episode depressed (HCC) 02/28/2014   MDD (major depressive disorder) 02/27/2014   Constipation 11/20/2013   Neurogenic bladder 11/20/2013   Depression with anxiety 05/05/2013   Substance induced mood disorder (HCC) 02/19/2012    Class: Chronic   Benzodiazepine abuse, continuous (HCC) 02/14/2012    Class: Chronic   PTSD (post-traumatic stress disorder) 02/14/2012    Class: Chronic   Opiate dependence (HCC)     Class: Chronic   History of DVT of lower extremity 11/27/2011   Paraplegia (HCC) 11/27/2011   Scoliosis 11/27/2011    Social Hx   Social History   Socioeconomic History   Marital status: Married    Spouse name: Not on file   Number of children: 1   Years of education: Not on file   Highest education level: Not on file  Occupational History   Not on file  Tobacco Use   Smoking status: Former    Current packs/day: 0.25    Average packs/day: 0.3 packs/day for 7.0 years (1.8 ttl pk-yrs)    Types: Cigarettes   Smokeless tobacco: Never   Tobacco  comments:    smokes 5 cig daily, vape  Vaping Use   Vaping status: Never Used  Substance and Sexual Activity   Alcohol use: No    Alcohol/week: 0.0 standard drinks of alcohol   Drug use: No    Types: Hydrocodone, IV    Comment: on subutex   Sexual activity: Not Currently    Partners: Male    Birth control/protection: None  Other Topics Concern   Not on file  Social History Narrative   Not on file   Social Drivers of Health   Financial Resource Strain: Not on file  Food Insecurity: Not on file  Transportation Needs: Not on file  Physical Activity: Not on file  Stress: Not on file  Social Connections: Not on file    Review of Systems Per HPI  Objective:  There were no vitals taken for this visit.     06/09/2019    2:02 PM 12/09/2018   11:54 AM 05/09/2018   12:15 AM  BP/Weight  Systolic BP  131 409  Diastolic BP  84 80  Wt. (Lbs) 215    BMI 34.7 kg/m2      Physical Exam Vitals and nursing note reviewed.  Constitutional:      General: She is not in acute distress.    Appearance: She is obese.  HENT:     Head: Normocephalic and atraumatic.  Cardiovascular:  Rate and Rhythm: Normal rate and regular rhythm.  Pulmonary:     Effort: Pulmonary effort is normal.     Breath sounds: Normal breath sounds. No wheezing.  Neurological:     Mental Status: She is alert.     Lab Results  Component Value Date   WBC 13.6 (H) 12/16/2017   HGB 11.6 (L) 12/16/2017   HCT 33.7 (L) 12/16/2017   PLT 238 12/16/2017   GLUCOSE 91 11/12/2017   ALT 27 11/12/2017   AST 18 11/12/2017   NA 138 11/12/2017   K 4.4 11/12/2017   CL 104 11/12/2017   CREATININE 0.52 11/12/2017   BUN 12 11/12/2017   CO2 21 (L) 11/12/2017   TSH 0.754 01/14/2016   INR 0.9 02/15/2017   HGBA1C 5.2 09/21/2015     Assessment & Plan:  History of anemia -     CBC  High risk medication use -     CMP14+EGFR -     TSH  Screening for lipid disorders -     Lipid panel  Neurogenic  bladder Assessment & Plan: Form filled out so she can continue to get catheters for intermittent catheterization.   Obesity (BMI 30-39.9) Assessment & Plan: I did not feel that insurance will cover [phone].  Defer prescribing to her primary care physician. Labs ordered today.     Follow-up: Advised regular follow-up with her primary care physician  Everlene Other DO [facility]

## 2023-07-26 NOTE — Patient Instructions (Signed)
 Form filled out and will be faxed.  Labs ordered.  Please follow up with Dr. Lorin Picket as soon as you can.

## 2023-07-26 NOTE — Assessment & Plan Note (Signed)
 Form filled out so she can continue to get catheters for intermittent catheterization.

## 2023-07-27 ENCOUNTER — Telehealth: Payer: Self-pay

## 2023-07-27 LAB — CBC
Hematocrit: 36.2 % (ref 34.0–46.6)
Hemoglobin: 11.8 g/dL (ref 11.1–15.9)
MCH: 27.9 pg (ref 26.6–33.0)
MCHC: 32.6 g/dL (ref 31.5–35.7)
MCV: 86 fL (ref 79–97)
Platelets: 252 10*3/uL (ref 150–450)
RBC: 4.23 x10E6/uL (ref 3.77–5.28)
RDW: 12.9 % (ref 11.7–15.4)
WBC: 8.1 10*3/uL (ref 3.4–10.8)

## 2023-07-27 LAB — LIPID PANEL
Chol/HDL Ratio: 4 {ratio} (ref 0.0–4.4)
Cholesterol, Total: 182 mg/dL (ref 100–199)
HDL: 45 mg/dL (ref >39)
LDL Chol Calc (NIH): 126 mg/dL — ABNORMAL HIGH (ref 0–99)
Triglycerides: 59 mg/dL (ref 0–149)
VLDL Cholesterol Cal: 11 mg/dL (ref 5–40)

## 2023-07-27 LAB — CMP14+EGFR
ALT: 14 [IU]/L (ref 0–32)
AST: 11 [IU]/L (ref 0–40)
Albumin: 4.3 g/dL (ref 3.9–4.9)
Alkaline Phosphatase: 98 [IU]/L (ref 44–121)
BUN/Creatinine Ratio: 21 (ref 9–23)
BUN: 13 mg/dL (ref 6–20)
Bilirubin Total: 0.2 mg/dL (ref 0.0–1.2)
CO2: 25 mmol/L (ref 20–29)
Calcium: 9.2 mg/dL (ref 8.7–10.2)
Chloride: 103 mmol/L (ref 96–106)
Creatinine, Ser: 0.61 mg/dL (ref 0.57–1.00)
Globulin, Total: 2.6 g/dL (ref 1.5–4.5)
Glucose: 107 mg/dL — ABNORMAL HIGH (ref 70–99)
Potassium: 4.5 mmol/L (ref 3.5–5.2)
Sodium: 142 mmol/L (ref 134–144)
Total Protein: 6.9 g/dL (ref 6.0–8.5)
eGFR: 120 mL/min/{1.73_m2} (ref >59)

## 2023-07-27 LAB — TSH: TSH: 7.4 u[IU]/mL — ABNORMAL HIGH (ref 0.450–4.500)

## 2023-07-27 NOTE — Telephone Encounter (Signed)
 Reason for CRM: calling about lab results - have questions [phone]

## 2023-07-29 ENCOUNTER — Encounter: Payer: Self-pay | Admitting: Family Medicine

## 2023-07-30 ENCOUNTER — Ambulatory Visit: Payer: MEDICAID | Admitting: Nurse Practitioner

## 2023-07-30 ENCOUNTER — Other Ambulatory Visit: Payer: Self-pay | Admitting: Family Medicine

## 2023-07-30 DIAGNOSIS — R7989 Other specified abnormal findings of blood chemistry: Secondary | ICD-10-CM

## 2023-08-10 LAB — T4, FREE: Free T4: 1.19 ng/dL (ref 0.82–1.77)

## 2023-08-16 ENCOUNTER — Encounter: Payer: Self-pay | Admitting: Family Medicine

## 2023-08-17 ENCOUNTER — Other Ambulatory Visit: Payer: Self-pay

## 2023-08-17 DIAGNOSIS — R7989 Other specified abnormal findings of blood chemistry: Secondary | ICD-10-CM

## 2023-08-17 NOTE — Telephone Encounter (Signed)
 Did speak with patient regarding her lab results, she does state that she discussed addition of ozempic with the doctor at the last visit. Please send to [facility] in [place] in [place]. She states if the insurance doesn't approve it she will pay out of pocket, pt would like to be notified once this has been done.

## 2024-05-06 ENCOUNTER — Encounter: Payer: Self-pay | Admitting: Family Medicine
